# Patient Record
Sex: Female | Born: 1940
Health system: Southern US, Community
[De-identification: ages and names within clinical notes are randomized; demographics above are authoritative.]

## PROBLEM LIST (undated history)

## (undated) DIAGNOSIS — F329 Major depressive disorder, single episode, unspecified: Secondary | ICD-10-CM

## (undated) DIAGNOSIS — C4491 Basal cell carcinoma of skin, unspecified: Secondary | ICD-10-CM

## (undated) DIAGNOSIS — E785 Hyperlipidemia, unspecified: Secondary | ICD-10-CM

## (undated) DIAGNOSIS — G8929 Other chronic pain: Secondary | ICD-10-CM

## (undated) DIAGNOSIS — R51 Headache: Secondary | ICD-10-CM

## (undated) DIAGNOSIS — R519 Headache, unspecified: Secondary | ICD-10-CM

## (undated) DIAGNOSIS — K6281 Anal sphincter tear (healed) (nontraumatic) (old): Secondary | ICD-10-CM

## (undated) DIAGNOSIS — R42 Dizziness and giddiness: Secondary | ICD-10-CM

## (undated) DIAGNOSIS — F32A Depression, unspecified: Secondary | ICD-10-CM

## (undated) DIAGNOSIS — K5790 Diverticulosis of intestine, part unspecified, without perforation or abscess without bleeding: Secondary | ICD-10-CM

## (undated) HISTORY — DX: Anal sphincter tear (healed) (nontraumatic) (old): K62.81

## (undated) HISTORY — DX: Hyperlipidemia, unspecified: E78.5

## (undated) HISTORY — PX: AUGMENTATION MAMMAPLASTY: SUR837

## (undated) HISTORY — PX: COLONOSCOPY: SHX174

## (undated) HISTORY — DX: Headache, unspecified: R51.9

## (undated) HISTORY — DX: Major depressive disorder, single episode, unspecified: F32.9

## (undated) HISTORY — DX: Diverticulosis of intestine, part unspecified, without perforation or abscess without bleeding: K57.90

## (undated) HISTORY — DX: Basal cell carcinoma of skin, unspecified: C44.91

## (undated) HISTORY — PX: TONSILLECTOMY: SUR1361

## (undated) HISTORY — DX: Other chronic pain: G89.29

## (undated) HISTORY — DX: Depression, unspecified: F32.A

## (undated) HISTORY — DX: Headache: R51

## (undated) HISTORY — PX: PARTIAL HYSTERECTOMY: SHX80

---

## 2004-12-31 ENCOUNTER — Ambulatory Visit: Payer: Self-pay | Admitting: Internal Medicine

## 2005-01-10 ENCOUNTER — Ambulatory Visit: Payer: Self-pay | Admitting: Internal Medicine

## 2007-01-05 ENCOUNTER — Ambulatory Visit: Payer: Self-pay

## 2007-11-16 ENCOUNTER — Other Ambulatory Visit: Payer: Self-pay

## 2007-11-16 ENCOUNTER — Ambulatory Visit: Payer: Self-pay

## 2010-02-05 ENCOUNTER — Ambulatory Visit: Payer: Self-pay

## 2010-05-08 ENCOUNTER — Ambulatory Visit: Payer: Self-pay | Admitting: General Practice

## 2014-02-15 ENCOUNTER — Encounter: Payer: Self-pay | Admitting: Internal Medicine

## 2014-04-13 ENCOUNTER — Ambulatory Visit (INDEPENDENT_AMBULATORY_CARE_PROVIDER_SITE_OTHER): Payer: Medicare Other | Admitting: Internal Medicine

## 2014-04-13 ENCOUNTER — Encounter: Payer: Self-pay | Admitting: Internal Medicine

## 2014-04-13 VITALS — BP 140/74 | HR 72 | Ht 62.0 in | Wt 154.1 lb

## 2014-04-13 DIAGNOSIS — K6289 Other specified diseases of anus and rectum: Secondary | ICD-10-CM

## 2014-04-13 DIAGNOSIS — R159 Full incontinence of feces: Secondary | ICD-10-CM

## 2014-04-13 DIAGNOSIS — K629 Disease of anus and rectum, unspecified: Secondary | ICD-10-CM | POA: Insufficient documentation

## 2014-04-13 DIAGNOSIS — K6281 Anal sphincter tear (healed) (nontraumatic) (old): Secondary | ICD-10-CM

## 2014-04-13 HISTORY — DX: Anal sphincter tear (healed) (nontraumatic) (old): K62.81

## 2014-04-13 NOTE — Assessment & Plan Note (Signed)
read the assessment and plan for fecal incontinence If she fails to respond then consider colorectal surgery referral

## 2014-04-13 NOTE — Assessment & Plan Note (Addendum)
I have explained cause, she has sphincter deficit anteriorly most likely related to birth trauma in the past [place] Fecal Incontinence info Benefiber 1-2 tbsp daily My Chart me back 1 month Colonoscopy not needed until 12/2014 routine

## 2014-04-13 NOTE — Patient Instructions (Signed)
Today we are giving you reading information on Fecal Incontinence, [place] exercises, Benefiber ( work up to 2 tablespoons daily).   MyChart [place] back in a month to let us know how your doing.   I appreciate the opportunity to care for you.

## 2014-04-13 NOTE — Progress Notes (Signed)
Subjective:    Patient ID: Brenda Wiley, female    DOB: 11-Jul-1941, 73 y.o.   MRN: [id number]  HPI The patient is a pleasant elderly woman known from prior routine screening colonoscopy. She has been having several month history of worsening fecal incontinence. It's been an intermittent problem for a number of years. She reports having a breech delivery with her first child and a severe tear. Over the years she's had some intermittent mild leakage of stool but lately has been worse. She is quite frequently try to cleanse her anal area as well. She denies constipation or diarrhea problems. There is no rectal bleeding there is no real change in bowel habits. She denies any urinary incontinence. Allergies  Allergen Reactions  . Asa [Aspirin]   . Ceclor [Cefaclor]   . Codeine Itching    Will take if I have to   No outpatient prescriptions prior to visit.   No facility-administered medications prior to visit.   Past Medical History  Diagnosis Date  . Depression     loss of spouse  . Basal cell carcinoma   . Chronic headaches     resolved  . Diverticulosis   . HLD (hyperlipidemia)    Past Surgical History  Procedure Laterality Date  . Cesarean section      x 2  . Partial hysterectomy    . Tonsillectomy    . Colonoscopy     History   Social History  . Marital Status: Married    Spouse Name: N/A    Number of Children: 3  . Years of Education: N/A   Social History Main Topics  . Smoking status: Former Smoker    Types: Cigarettes    Quit date: 10/21/2012  . Smokeless tobacco: Never Used  . Alcohol Use: No     Comment: occasional-beer  . Drug Use: No  . Sexual Activity: None   Other Topics Concern  . None   Social History Narrative   Widowed after second marriage. 2 sons one daughter. None of them are local.   She is retired Building control surveyor.      She drinks one half cups of coffee daily.   Family History  Problem Relation  Age of Onset  . Diabetes Other     maternal side  . Heart disease Father   . Heart disease Mother   . Heart disease Brother   . Cancer Maternal Aunt     type unknown  . Diverticulitis Other     nephew x 2     Review of Systems Some insomnia and depressive symptoms related to loss of her spouse, back pain some cough. All other review of systems negative or as per history of present illness.    Objective:   Physical Exam General:  Well-developed, well-nourished and in no acute distress Eyes:  anicteric. Neck:   supple w/o thyromegaly or mass.  Lungs: Clear to auscultation bilaterally. Heart:  S1S2, no rubs, murmurs, gallops. Abdomen:  soft, non-tender, no hepatosplenomegaly, hernia, or mass and BS+. Low-transverse scar Rectal:   Anoderm inspection revealed anterior scar Anal wink was absent Digital exam revealed mild-moderate decreased  resting tone and voluntary squeeze - anterior No mass or rectocele present. Simulated defecation with valsalva revealed appropriate abdominal contraction and descent.   Anoscopy was performed with the patient in the left lateral decubitus position while a chaperone was present and revealed anterior sphincter defect, no hemorrhoids, normal mucosa   Lymph:  no cervical or supraclavicular adenopathy. Extremities:   no edema Skin   no rash. Neuro:  A&O x 3.  Psych:  appropriate mood and  Affect.   Data Reviewed:  2006 colonoscopy showing small hemorrhoids and sigmoid diverticulosis      Assessment & Plan:  Fecal incontinence due to anorectal disorder I have explained cause, she has sphincter deficit anteriorly most likely related to birth trauma in the past [place] Fecal Incontinence info Benefiber 1-2 tbsp daily My Chart me back 1 month Colonoscopy not needed until 12/2014 routine  Anal sphincter tear (healed) (old)-anterior  read the assessment and plan for fecal incontinence If she fails to respond then consider colorectal  surgery referral   I appreciate the opportunity to care for this patient. CC: Ronald Lobo, MD

## 2015-01-09 ENCOUNTER — Encounter: Payer: Self-pay | Admitting: Internal Medicine

## 2015-01-19 ENCOUNTER — Ambulatory Visit: Admit: 2015-01-19 | Disposition: A | Discharge status: Home / Self Care | Payer: Self-pay

## 2015-02-17 ENCOUNTER — Ambulatory Visit (INDEPENDENT_AMBULATORY_CARE_PROVIDER_SITE_OTHER): Payer: Medicare Other | Admitting: Cardiovascular Disease

## 2015-02-17 ENCOUNTER — Encounter: Payer: Self-pay | Admitting: Cardiovascular Disease

## 2015-02-17 VITALS — BP 150/64 | HR 61 | Ht 62.0 in | Wt 143.5 lb

## 2015-02-17 DIAGNOSIS — Z720 Tobacco use: Secondary | ICD-10-CM | POA: Diagnosis not present

## 2015-02-17 DIAGNOSIS — F17200 Nicotine dependence, unspecified, uncomplicated: Secondary | ICD-10-CM

## 2015-02-17 DIAGNOSIS — R262 Difficulty in walking, not elsewhere classified: Secondary | ICD-10-CM

## 2015-02-17 DIAGNOSIS — R0602 Shortness of breath: Secondary | ICD-10-CM

## 2015-02-17 DIAGNOSIS — E785 Hyperlipidemia, unspecified: Secondary | ICD-10-CM

## 2015-02-17 DIAGNOSIS — M160 Bilateral primary osteoarthritis of hip: Secondary | ICD-10-CM | POA: Diagnosis not present

## 2015-02-17 NOTE — Assessment & Plan Note (Signed)
Etiology of her shortness of breath is concerning for ischemia. Strong family history of coronary artery disease, father died in his 61s. She is a smoker, hyperlipidemia. Worsening shortness of breath symptoms over the past year. She is unable to treadmill secondary to severe hip pain. We will order a pharmacologic Myoview to rule out ischemia. Recent PFTs suggesting no significant  COPD

## 2015-02-17 NOTE — Assessment & Plan Note (Signed)
She does not want a statin at this time. We have recommended dietary restrictions, staying active

## 2015-02-17 NOTE — Assessment & Plan Note (Signed)
We have encouraged her to continue to work on weaning her cigarettes and smoking cessation. She will continue to work on this and does not want any assistance with chantix.  

## 2015-02-17 NOTE — Progress Notes (Signed)
Patient ID: Brenda Wiley, female    DOB: 09/26/1941, 74 y.o.   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  She reports that for the past several months probably up to one year she has had shortness of breath. Symptoms worse with exertion. She has had several episodes of severe shortness of breath with exertion that have caught her attention. These episodes she had to stop, catch her breath before continuing her activity.  She was recently sent for PFTs at [facility] The showed no significant COPD or restrictive lung disease  She continues to smoke several cigarettes, possibly one pack per week or less. She denies any significant chest pain. She is relatively active but limited by her hip pain. She thinks that she needs a cortisone shot for her symptoms. Unable to sleep on her right side secondary to shoulder pain. She was concerned some of her medications could be causing her shortness of breath and she stopped her Flexeril.  She takes phentermine for weight loss. EKG on today's visit shows normal sinus rhythm with rate 61 bpm, no significant ST or T-wave changes   Allergies  Allergen Reactions  . Asa [Aspirin]   . Ceclor [Cefaclor]   . Codeine Itching    Will take if I have to    Outpatient Encounter Prescriptions as of 02/17/2015  Medication Sig  . FLUoxetine (PROZAC) 10 MG capsule Take 10 mg by mouth daily.   . Oxycodone HCl 10 MG TABS Take 10 mg by mouth 2 (two) times daily as needed. for pain  . Phentermine HCl 37.5 MG TBDP Take 18.75 mg by mouth daily.  . [DISCONTINUED] cyclobenzaprine (FLEXERIL) 5 MG tablet Take 2.5 mg by mouth as needed for muscle spasms.  . [DISCONTINUED] zolpidem (AMBIEN) 10 MG tablet Take 5 mg by mouth at bedtime as needed.     Past Medical  History  Diagnosis Date  . Depression     loss of spouse  . Basal cell carcinoma   . Chronic headaches     resolved  . Diverticulosis   . HLD (hyperlipidemia)   . Anal sphincter tear (healed) (old)-anterior 04/13/2014    Past Surgical History  Procedure Laterality Date  . Cesarean section      x 2  . Partial hysterectomy    . Tonsillectomy    . Colonoscopy      Social History  reports that she quit smoking about 2 years ago. Her smoking use included Cigarettes. She has never used smokeless tobacco. She reports that she does not drink alcohol or use illicit drugs.  Family History family history includes Cancer in her maternal aunt; Diabetes in her other; Diverticulitis in her other; Heart disease in her brother, father, and mother. Father died at 84 from MI, mother died at 38 also had severe CAD, both were smokers     Review of Systems  Constitutional: Negative.   Respiratory: Positive for shortness of breath.   Gastrointestinal: Negative.   Musculoskeletal: Positive for arthralgias.  Skin: Negative.   Neurological: Negative.   Hematological: Negative.   Psychiatric/Behavioral: Negative.   All other systems reviewed and are negative.   BP 150/64 mmHg  Pulse 61  Ht 5\' 2"  (1.575 m)  Wt 143 lb 8 oz (65.091 kg)  BMI 26.24 kg/m2  Physical Exam  Constitutional:  She is oriented to person, place, and time. She appears well-developed and well-nourished.  HENT:  Head: Normocephalic.  Nose: Nose normal.  Mouth/Throat: Oropharynx is clear and moist.  Eyes: Conjunctivae are normal. Pupils are equal, round, and reactive to light.  Neck: Normal range of motion. Neck supple. No JVD present.  Cardiovascular: Normal rate, regular rhythm, S1 normal, S2 normal, normal heart sounds and intact distal pulses.  Exam reveals no gallop and no friction rub.   No murmur heard. Pulmonary/Chest: Effort normal and breath sounds normal. No respiratory distress. She has no wheezes. She has no  rales. She exhibits no tenderness.  Abdominal: Soft. Bowel sounds are normal. She exhibits no distension. There is no tenderness.  Musculoskeletal: Normal range of motion. She exhibits no edema or tenderness.  Lymphadenopathy:    She has no cervical adenopathy.  Neurological: She is alert and oriented to person, place, and time. Coordination normal.  Skin: Skin is warm and dry. No rash noted. No erythema.  Psychiatric: She has a normal mood and affect. Her behavior is normal. Judgment and thought content normal.    Assessment and Plan  Nursing note and vitals reviewed.

## 2015-02-17 NOTE — Patient Instructions (Addendum)
You are doing well. No medication changes were made.  We will schedule a lexiscan stress test for shortness of breath  Please call us if you have new issues that need to be addressed before your next appt.    [place]  Your caregiver has ordered a Stress Test with nuclear imaging. The purpose of this test is to evaluate the blood supply to your heart muscle. This procedure is referred to as a "Non-Invasive Stress Test." This is because other than having an IV started in your vein, nothing is inserted or "invades" your body. Cardiac stress tests are done to find areas of poor blood flow to the heart by determining the extent of coronary artery disease (CAD). Some patients exercise on a treadmill, which naturally increases the blood flow to your heart, while others who are  unable to walk on a treadmill due to physical limitations have a pharmacologic/chemical stress agent called Lexiscan . This medicine will mimic walking on a treadmill by temporarily increasing your coronary blood flow.   Please note: these test may take anywhere between 2-4 hours to complete  PLEASE REPORT TO [facility] AT THE FIRST DESK WILL DIRECT YOU WHERE TO GO  Date of Procedure:_____Tuesday, May 3________  Arrival Time for Procedure:______7:15 am________   PLEASE NOTIFY THE OFFICE AT LEAST 24 HOURS IN ADVANCE IF YOU ARE UNABLE TO KEEP YOUR APPOINTMENT.  [phone] AND  PLEASE NOTIFY NUCLEAR MEDICINE AT [facility] AT LEAST 24 HOURS IN ADVANCE IF YOU ARE UNABLE TO KEEP YOUR APPOINTMENT. [phone]  How to prepare for your Myoview test:  1. Do not eat or drink after midnight 2. No caffeine for 24 hours prior to test 3. No smoking 24 hours prior to test. 4. Your medication may be taken with water.  If your doctor stopped a medication because of this test, do not take that medication. 5. Ladies, please do not wear dresses.  Skirts or pants are appropriate. Please wear a short sleeve  shirt. 6. No perfume, cologne or lotion.

## 2015-02-17 NOTE — Assessment & Plan Note (Signed)
Significant hip pain. She feels she needs a cortisone shot. We'll avoid treadmill  stress test  given her symptoms

## 2015-02-21 ENCOUNTER — Ambulatory Visit: Payer: Medicare Other

## 2015-02-23 ENCOUNTER — Ambulatory Visit: Payer: Medicare Other

## 2015-02-23 ENCOUNTER — Encounter: Payer: Self-pay | Admitting: Physician Assistant

## 2015-02-23 ENCOUNTER — Other Ambulatory Visit: Payer: Self-pay | Admitting: Cardiovascular Disease

## 2015-02-23 ENCOUNTER — Encounter: Admission: RE | Admit: 2015-02-23 | Payer: Medicare Other | Source: Ambulatory Visit

## 2015-02-23 ENCOUNTER — Ambulatory Visit
Admission: RE | Admit: 2015-02-23 | Discharge: 2015-02-23 | Disposition: A | Discharge status: Home / Self Care | Payer: Medicare Other | Source: Ambulatory Visit | Attending: Cardiovascular Disease | Admitting: Cardiovascular Disease

## 2015-02-23 ENCOUNTER — Encounter
Admission: RE | Admit: 2015-02-23 | Discharge: 2015-02-23 | Disposition: A | Discharge status: Home / Self Care | Payer: Medicare Other | Source: Ambulatory Visit | Attending: Cardiovascular Disease | Admitting: Cardiovascular Disease

## 2015-02-23 DIAGNOSIS — M160 Bilateral primary osteoarthritis of hip: Secondary | ICD-10-CM | POA: Diagnosis not present

## 2015-02-23 DIAGNOSIS — R0602 Shortness of breath: Secondary | ICD-10-CM

## 2015-02-23 DIAGNOSIS — E785 Hyperlipidemia, unspecified: Secondary | ICD-10-CM

## 2015-02-23 DIAGNOSIS — R42 Dizziness and giddiness: Secondary | ICD-10-CM

## 2015-02-23 DIAGNOSIS — F17200 Nicotine dependence, unspecified, uncomplicated: Secondary | ICD-10-CM | POA: Insufficient documentation

## 2015-02-23 DIAGNOSIS — R262 Difficulty in walking, not elsewhere classified: Secondary | ICD-10-CM | POA: Diagnosis not present

## 2015-02-23 HISTORY — DX: Dizziness and giddiness: R42

## 2015-02-23 LAB — NM MYOCAR MULTI W/SPECT W/WALL MOTION / EF
CHL CUP NUCLEAR SDS: 0
CHL CUP STRESS STAGE 1 HR: 50 {beats}/min
CHL CUP STRESS STAGE 2 HR: 54 {beats}/min
CHL CUP STRESS STAGE 5 DBP: 48 mmHg
CHL CUP STRESS STAGE 5 GRADE: 0 %
CHL CUP STRESS STAGE 5 SPEED: 0 mph
CSEPEW: 1 METS
CSEPPHR: 67 {beats}/min
LV dias vol: 28 mL
LV sys vol: 61 mL
Percent of predicted max HR: 45 %
Rest HR: 51 {beats}/min
SRS: 0
SSS: 1
Stage 2 Grade: 0 %
Stage 2 Speed: 0 mph
Stage 3 Grade: 0 %
Stage 3 HR: 54 {beats}/min
Stage 3 Speed: 0 mph
Stage 4 Grade: 0 %
Stage 4 HR: 67 {beats}/min
Stage 4 Speed: 0 mph
Stage 5 HR: 71 {beats}/min
Stage 5 SBP: 145 mmHg
TID: 1.19

## 2015-02-23 MED ORDER — REGADENOSON 0.4 MG/5ML IV SOLN
0.4000 mg | Freq: Once | INTRAVENOUS | Status: AC
  Administered 2015-02-23: 0.4 mg via INTRAVENOUS

## 2015-02-23 MED ORDER — TECHNETIUM TC 99M SESTAMIBI - CARDIOLITE
14.2330 | Freq: Once | INTRAVENOUS | Status: AC | PRN
  Administered 2015-02-23: 14.233 via INTRAVENOUS

## 2015-02-23 MED ORDER — TECHNETIUM TC 99M SESTAMIBI - CARDIOLITE
30.0000 | Freq: Once | INTRAVENOUS | Status: AC | PRN
  Administered 2015-02-23: 09:00:00 28.94 via INTRAVENOUS

## 2015-02-23 NOTE — CV Procedure (Signed)
    Ordering MD: Dr. Rockey Situ, MD  Test type: Lexiscan Myoview  EKG: sinus bradycardia, 51 bpm, no st/t changes  Pulse: 51  BP: 147/56  Max heart rate: 73  Max BP: 147/56  %HR: 50%  Symptoms: mild chest tightness, resolved by test end  EKG changes: TWI II, III, aVF, V3-V6  Myoview Images to follow.    Christell Faith, PA-C 02/23/2015 8:44 AM

## 2015-02-24 ENCOUNTER — Ambulatory Visit: Payer: Medicare Other

## 2015-05-30 ENCOUNTER — Encounter: Payer: Self-pay | Admitting: Internal Medicine

## 2016-11-12 DIAGNOSIS — F3341 Major depressive disorder, recurrent, in partial remission: Secondary | ICD-10-CM | POA: Diagnosis not present

## 2016-11-29 DIAGNOSIS — F3341 Major depressive disorder, recurrent, in partial remission: Secondary | ICD-10-CM | POA: Diagnosis not present

## 2016-11-29 DIAGNOSIS — J431 Panlobular emphysema: Secondary | ICD-10-CM | POA: Diagnosis not present

## 2016-11-29 DIAGNOSIS — G894 Chronic pain syndrome: Secondary | ICD-10-CM | POA: Diagnosis not present

## 2016-12-24 DIAGNOSIS — F3341 Major depressive disorder, recurrent, in partial remission: Secondary | ICD-10-CM | POA: Diagnosis not present

## 2016-12-24 DIAGNOSIS — J431 Panlobular emphysema: Secondary | ICD-10-CM | POA: Diagnosis not present

## 2016-12-24 DIAGNOSIS — G894 Chronic pain syndrome: Secondary | ICD-10-CM | POA: Diagnosis not present

## 2016-12-24 DIAGNOSIS — R7309 Other abnormal glucose: Secondary | ICD-10-CM | POA: Diagnosis not present

## 2016-12-24 DIAGNOSIS — Z79899 Other long term (current) drug therapy: Secondary | ICD-10-CM | POA: Diagnosis not present

## 2016-12-24 DIAGNOSIS — E7800 Pure hypercholesterolemia, unspecified: Secondary | ICD-10-CM | POA: Diagnosis not present

## 2016-12-24 DIAGNOSIS — Z0000 Encounter for general adult medical examination without abnormal findings: Secondary | ICD-10-CM | POA: Diagnosis not present

## 2017-01-28 DIAGNOSIS — G894 Chronic pain syndrome: Secondary | ICD-10-CM | POA: Diagnosis not present

## 2017-01-28 DIAGNOSIS — M5137 Other intervertebral disc degeneration, lumbosacral region: Secondary | ICD-10-CM | POA: Diagnosis not present

## 2017-03-26 DIAGNOSIS — J449 Chronic obstructive pulmonary disease, unspecified: Secondary | ICD-10-CM | POA: Diagnosis not present

## 2017-03-26 DIAGNOSIS — R7309 Other abnormal glucose: Secondary | ICD-10-CM | POA: Diagnosis not present

## 2017-03-26 DIAGNOSIS — G894 Chronic pain syndrome: Secondary | ICD-10-CM | POA: Diagnosis not present

## 2017-03-26 DIAGNOSIS — E7800 Pure hypercholesterolemia, unspecified: Secondary | ICD-10-CM | POA: Diagnosis not present

## 2017-03-26 DIAGNOSIS — Z79899 Other long term (current) drug therapy: Secondary | ICD-10-CM | POA: Diagnosis not present

## 2017-03-26 DIAGNOSIS — J431 Panlobular emphysema: Secondary | ICD-10-CM | POA: Diagnosis not present

## 2017-04-10 DIAGNOSIS — E871 Hypo-osmolality and hyponatremia: Secondary | ICD-10-CM | POA: Diagnosis not present

## 2017-05-02 ENCOUNTER — Other Ambulatory Visit: Payer: Self-pay | Admitting: Internal Medicine

## 2017-05-02 DIAGNOSIS — F3341 Major depressive disorder, recurrent, in partial remission: Secondary | ICD-10-CM | POA: Diagnosis not present

## 2017-05-02 DIAGNOSIS — N183 Chronic kidney disease, stage 3 unspecified: Secondary | ICD-10-CM

## 2017-05-02 DIAGNOSIS — G894 Chronic pain syndrome: Secondary | ICD-10-CM | POA: Diagnosis not present

## 2017-05-02 DIAGNOSIS — E7800 Pure hypercholesterolemia, unspecified: Secondary | ICD-10-CM | POA: Diagnosis not present

## 2017-05-08 ENCOUNTER — Ambulatory Visit
Admission: RE | Admit: 2017-05-08 | Discharge: 2017-05-08 | Disposition: A | Discharge status: Home / Self Care | Payer: PPO | Source: Ambulatory Visit | Attending: Internal Medicine | Admitting: Internal Medicine

## 2017-05-08 DIAGNOSIS — N183 Chronic kidney disease, stage 3 unspecified: Secondary | ICD-10-CM

## 2017-06-18 DIAGNOSIS — G894 Chronic pain syndrome: Secondary | ICD-10-CM | POA: Diagnosis not present

## 2017-06-18 DIAGNOSIS — N289 Disorder of kidney and ureter, unspecified: Secondary | ICD-10-CM | POA: Diagnosis not present

## 2017-06-18 DIAGNOSIS — F3341 Major depressive disorder, recurrent, in partial remission: Secondary | ICD-10-CM | POA: Diagnosis not present

## 2017-06-18 DIAGNOSIS — J431 Panlobular emphysema: Secondary | ICD-10-CM | POA: Diagnosis not present

## 2017-06-18 DIAGNOSIS — M5137 Other intervertebral disc degeneration, lumbosacral region: Secondary | ICD-10-CM | POA: Diagnosis not present

## 2017-06-18 DIAGNOSIS — E7800 Pure hypercholesterolemia, unspecified: Secondary | ICD-10-CM | POA: Diagnosis not present

## 2017-06-18 DIAGNOSIS — R7309 Other abnormal glucose: Secondary | ICD-10-CM | POA: Diagnosis not present

## 2017-08-28 DIAGNOSIS — G894 Chronic pain syndrome: Secondary | ICD-10-CM | POA: Diagnosis not present

## 2017-08-28 DIAGNOSIS — Z79899 Other long term (current) drug therapy: Secondary | ICD-10-CM | POA: Diagnosis not present

## 2017-08-28 DIAGNOSIS — R7309 Other abnormal glucose: Secondary | ICD-10-CM | POA: Diagnosis not present

## 2017-08-28 DIAGNOSIS — E7800 Pure hypercholesterolemia, unspecified: Secondary | ICD-10-CM | POA: Diagnosis not present

## 2017-08-28 DIAGNOSIS — F3341 Major depressive disorder, recurrent, in partial remission: Secondary | ICD-10-CM | POA: Diagnosis not present

## 2017-08-28 DIAGNOSIS — J431 Panlobular emphysema: Secondary | ICD-10-CM | POA: Diagnosis not present

## 2017-12-03 DIAGNOSIS — J449 Chronic obstructive pulmonary disease, unspecified: Secondary | ICD-10-CM | POA: Diagnosis not present

## 2017-12-03 DIAGNOSIS — G894 Chronic pain syndrome: Secondary | ICD-10-CM | POA: Diagnosis not present

## 2017-12-03 DIAGNOSIS — E7800 Pure hypercholesterolemia, unspecified: Secondary | ICD-10-CM | POA: Diagnosis not present

## 2017-12-03 DIAGNOSIS — Z0000 Encounter for general adult medical examination without abnormal findings: Secondary | ICD-10-CM | POA: Diagnosis not present

## 2017-12-03 DIAGNOSIS — Z79899 Other long term (current) drug therapy: Secondary | ICD-10-CM | POA: Diagnosis not present

## 2017-12-03 DIAGNOSIS — J431 Panlobular emphysema: Secondary | ICD-10-CM | POA: Diagnosis not present

## 2017-12-03 DIAGNOSIS — R7309 Other abnormal glucose: Secondary | ICD-10-CM | POA: Diagnosis not present

## 2018-04-13 DIAGNOSIS — Z79899 Other long term (current) drug therapy: Secondary | ICD-10-CM | POA: Diagnosis not present

## 2018-04-13 DIAGNOSIS — E7800 Pure hypercholesterolemia, unspecified: Secondary | ICD-10-CM | POA: Diagnosis not present

## 2018-04-13 DIAGNOSIS — R7309 Other abnormal glucose: Secondary | ICD-10-CM | POA: Diagnosis not present

## 2018-04-21 DIAGNOSIS — I10 Essential (primary) hypertension: Secondary | ICD-10-CM | POA: Diagnosis not present

## 2018-05-12 ENCOUNTER — Other Ambulatory Visit: Payer: Self-pay | Admitting: Internal Medicine

## 2018-05-12 DIAGNOSIS — Z1211 Encounter for screening for malignant neoplasm of colon: Secondary | ICD-10-CM | POA: Diagnosis not present

## 2018-05-12 DIAGNOSIS — Z79899 Other long term (current) drug therapy: Secondary | ICD-10-CM | POA: Diagnosis not present

## 2018-05-12 DIAGNOSIS — J439 Emphysema, unspecified: Secondary | ICD-10-CM | POA: Diagnosis not present

## 2018-05-12 DIAGNOSIS — G894 Chronic pain syndrome: Secondary | ICD-10-CM | POA: Diagnosis not present

## 2018-05-12 DIAGNOSIS — Z0000 Encounter for general adult medical examination without abnormal findings: Secondary | ICD-10-CM | POA: Diagnosis not present

## 2018-05-12 DIAGNOSIS — J431 Panlobular emphysema: Secondary | ICD-10-CM | POA: Diagnosis not present

## 2018-05-12 DIAGNOSIS — F3341 Major depressive disorder, recurrent, in partial remission: Secondary | ICD-10-CM | POA: Diagnosis not present

## 2018-05-12 DIAGNOSIS — R1011 Right upper quadrant pain: Secondary | ICD-10-CM | POA: Diagnosis not present

## 2018-05-12 DIAGNOSIS — R7309 Other abnormal glucose: Secondary | ICD-10-CM | POA: Diagnosis not present

## 2018-05-12 DIAGNOSIS — E7800 Pure hypercholesterolemia, unspecified: Secondary | ICD-10-CM | POA: Diagnosis not present

## 2018-05-18 ENCOUNTER — Ambulatory Visit
Admission: RE | Admit: 2018-05-18 | Discharge: 2018-05-18 | Disposition: A | Discharge status: Home / Self Care | Payer: PPO | Source: Ambulatory Visit | Attending: Internal Medicine | Admitting: Internal Medicine

## 2018-05-18 DIAGNOSIS — I700 Atherosclerosis of aorta: Secondary | ICD-10-CM | POA: Diagnosis not present

## 2018-05-18 DIAGNOSIS — R1011 Right upper quadrant pain: Secondary | ICD-10-CM

## 2018-05-18 DIAGNOSIS — Z1211 Encounter for screening for malignant neoplasm of colon: Secondary | ICD-10-CM | POA: Diagnosis not present

## 2018-05-28 DIAGNOSIS — R195 Other fecal abnormalities: Secondary | ICD-10-CM | POA: Diagnosis not present

## 2018-07-07 DIAGNOSIS — H43811 Vitreous degeneration, right eye: Secondary | ICD-10-CM | POA: Diagnosis not present

## 2018-07-07 DIAGNOSIS — H2513 Age-related nuclear cataract, bilateral: Secondary | ICD-10-CM | POA: Diagnosis not present

## 2018-08-20 DIAGNOSIS — R7309 Other abnormal glucose: Secondary | ICD-10-CM | POA: Diagnosis not present

## 2018-08-20 DIAGNOSIS — J431 Panlobular emphysema: Secondary | ICD-10-CM | POA: Diagnosis not present

## 2018-08-20 DIAGNOSIS — R42 Dizziness and giddiness: Secondary | ICD-10-CM | POA: Diagnosis not present

## 2018-08-20 DIAGNOSIS — E7800 Pure hypercholesterolemia, unspecified: Secondary | ICD-10-CM | POA: Diagnosis not present

## 2018-08-20 DIAGNOSIS — Z79899 Other long term (current) drug therapy: Secondary | ICD-10-CM | POA: Diagnosis not present

## 2018-08-20 DIAGNOSIS — R011 Cardiac murmur, unspecified: Secondary | ICD-10-CM | POA: Diagnosis not present

## 2018-08-28 DIAGNOSIS — R011 Cardiac murmur, unspecified: Secondary | ICD-10-CM | POA: Diagnosis not present

## 2018-08-28 DIAGNOSIS — I6523 Occlusion and stenosis of bilateral carotid arteries: Secondary | ICD-10-CM | POA: Diagnosis not present

## 2018-08-28 DIAGNOSIS — R42 Dizziness and giddiness: Secondary | ICD-10-CM | POA: Diagnosis not present

## 2018-08-31 DIAGNOSIS — H2512 Age-related nuclear cataract, left eye: Secondary | ICD-10-CM | POA: Diagnosis not present

## 2018-08-31 DIAGNOSIS — H25012 Cortical age-related cataract, left eye: Secondary | ICD-10-CM | POA: Diagnosis not present

## 2018-08-31 DIAGNOSIS — H35342 Macular cyst, hole, or pseudohole, left eye: Secondary | ICD-10-CM | POA: Diagnosis not present

## 2018-08-31 DIAGNOSIS — H25013 Cortical age-related cataract, bilateral: Secondary | ICD-10-CM | POA: Diagnosis not present

## 2018-08-31 DIAGNOSIS — H2513 Age-related nuclear cataract, bilateral: Secondary | ICD-10-CM | POA: Diagnosis not present

## 2018-08-31 DIAGNOSIS — H35033 Hypertensive retinopathy, bilateral: Secondary | ICD-10-CM | POA: Diagnosis not present

## 2018-09-08 NOTE — Progress Notes (Signed)
[facility] Note  09/09/2018     CHIEF COMPLAINT Patient presents for Retina Evaluation   HISTORY OF PRESENT ILLNESS: Brenda Wiley is a 77 y.o. female who presents to the clinic today for:   HPI    Retina Evaluation    In left eye.  Duration of 9 days.  Associated Symptoms Floaters.  Negative for Flashes, Blind Spot, Photophobia, Scalp Tenderness, Fever, Weight Loss, Jaw Claudication, Glare, Pain, Distortion, Redness, Trauma, Shoulder/Hip pain and Fatigue.  Context:  distance vision, mid-range vision and near vision.  Treatments tried include no treatments.  Response to treatment was no improvement.  I, the attending physician,  performed the HPI with the patient and updated documentation appropriately.          Comments    Pt presents on the referral of Dr. Monna Fam for possible macular cyst/cataract clearance OS, pt states she does have floaters in her left eye only, but she denies FOL and blurry vision, pt denies being diabetic       Last edited by Bernarda Caffey, MD on 09/09/2018 10:54 AM. (History)    pt states Dr. Herbert Deaner is planning on doing cataract sx on November 26th and she is here to get clearance for surgery, pt states she is not having any problems with her eyes, pt denies being diabetic or having high blood pressure, pt states she takes medication for cholesterol and anxiety, pt states she sees a floater in her left eye that she estimates has been there for 8-10 years, pt states she remembers the first time she saw it, she states she had pain in her eye and it caused her not to be able to see for a few minutes, pt states she saw Dr. Ellin Mayhew the day it happened  Referring physician: Monna Fam, [place],  [id number]  HISTORICAL INFORMATION:   Selected notes from the MEDICAL RECORD NUMBER Referred by Dr. Monna Fam for concern of macular cyst / surgical clearance LEE: 11.11.19 (K. Hecker) [BCVA:  OD: 20/25 OS: 20/30-] Ocular Hx-HTN ret, drusen, PVD, chemosis, optic nerve atrophy, arteriolar sclerosis PMH-anxiety, current smoker    CURRENT MEDICATIONS: Current Outpatient Medications (Ophthalmic Drugs)  Medication Sig  . ketorolac (ACULAR) 0.5 % ophthalmic solution INSTILL 1 DROP IN LEFT EYE FOUR TIMES A DAY START 1 WEEK PRIOR TO SURGERY  . ofloxacin (OCUFLOX) 0.3 % ophthalmic solution INSTILL 1 DROP IN BOTH EYES FOUR TIMES A DAY START 72 HOURS PRIOR TO SURGERY  . prednisoLONE acetate (PRED FORTE) 1 % ophthalmic suspension INSTILL 1 DROP IN LEFT EYE FOUR TIMES A DAY START AFTER SURGERY   No current facility-administered medications for this visit.  (Ophthalmic Drugs)   Current Outpatient Medications (Other)  Medication Sig  . ALPRAZolam (XANAX) 0.25 MG tablet Take by mouth.  . ALPRAZolam (XANAX) 0.25 MG tablet TAKE 1 TABLET BY MOUTH NIGHTLY AS NEEDED FOR SLEEP  . FLUoxetine (PROZAC) 10 MG capsule Take 10 mg by mouth daily.   . Oxycodone HCl 10 MG TABS Take 10 mg by mouth 2 (two) times daily as needed. for pain  . Phentermine HCl 37.5 MG TBDP Take 18.75 mg by mouth daily.   No current facility-administered medications for this visit.  (Other)      REVIEW OF SYSTEMS: ROS    Positive for: Constitutional, Eyes, Psychiatric   Negative for: Gastrointestinal, Neurological, Skin, Genitourinary, Musculoskeletal, HENT, Endocrine, Cardiovascular, Respiratory, Allergic/Imm, Heme/Lymph   Last edited by Ernest Mallick  J, COT on 09/09/2018  8:33 AM. (History)       ALLERGIES Allergies  Allergen Reactions  . Asa [Aspirin]   . Ceclor [Cefaclor]   . Codeine Itching    Will take if I have to    PAST MEDICAL HISTORY Past Medical History:  Diagnosis Date  . Anal sphincter tear (healed) (old)-anterior 04/13/2014  . Basal cell carcinoma   . Chronic headaches    resolved  . Depression    loss of spouse  . Diverticulosis   . Dizzy spells 02/23/2015   X 1year  . HLD (hyperlipidemia)     Past Surgical History:  Procedure Laterality Date  . CESAREAN SECTION     x 2  . COLONOSCOPY    . PARTIAL HYSTERECTOMY    . TONSILLECTOMY      FAMILY HISTORY Family History  Problem Relation Age of Onset  . Heart disease Father   . Heart disease Mother   . Cataracts Mother   . Diabetes Other        maternal side  . Heart disease Brother   . Cancer Maternal Aunt        type unknown  . Diverticulitis Other        nephew x 2  . Cataracts Sister   . Amblyopia Neg Hx   . Blindness Neg Hx   . Glaucoma Neg Hx   . Macular degeneration Neg Hx   . Retinal detachment Neg Hx   . Strabismus Neg Hx   . Retinitis pigmentosa Neg Hx     SOCIAL HISTORY Social History   Tobacco Use  . Smoking status: Former Smoker    Types: Cigarettes    Last attempt to quit: 10/21/2012    Years since quitting: 5.8  . Smokeless tobacco: Never Used  Substance Use Topics  . Alcohol use: No    Comment: occasional-beer  . Drug use: No         OPHTHALMIC EXAM:  Base Eye Exam    Visual Acuity (Snellen - Linear)      Right Left   Dist cc 20/70 +2 20/60   Dist ph cc 20/50 NI   Correction:  Glasses       Tonometry (Tonopen, 8:53 AM)      Right Left   Pressure 18 18       Pupils      Dark Light Shape React APD   Right 5 2 Round Brisk None   Left 5 2 Round Brisk None       Visual Fields (Counting fingers)      Left Right    Full Full       Extraocular Movement      Right Left    Full, Ortho Full, Ortho       Neuro/Psych    Oriented x3:  Yes   Mood/Affect:  Normal       Dilation    Both eyes:  1.0% Mydriacyl, 2.5% Phenylephrine @ 8:53 AM        Slit Lamp and Fundus Exam    Slit Lamp Exam      Right Left   Lids/Lashes Dermatochalasis - upper lid, mild Meibomian gland dysfunction, mild Telangiectasia Dermatochalasis - upper lid, mild Meibomian gland dysfunction, mild Telangiectasia   Conjunctiva/Sclera White and quiet White and quiet   Cornea Arcus, 1+ Punctate  epithelial erosions Arcus, 1+ Punctate epithelial erosions   Anterior Chamber Deep and clear Deep and clear   Iris Round and  dilated Round and dilated   Lens 2-3+ Nuclear sclerosis, 2+ Cortical cataract 3+ Nuclear sclerosis, 2-3+ Cortical cataract   Vitreous Vitreous syneresis Vitreous syneresis, Posterior vitreous detachment       Fundus Exam      Right Left   Disc Pink and Sharp Pink and Sharp   C/D Ratio 0.3 0.5   Macula Flat, Blunted foveal reflex, Retinal pigment epithelial mottling, no edema Flat, Blunted foveal reflex, Epiretinal membrane, Retinal pigment epithelial mottling, No heme or edema   Vessels mild Vascular attenuation, Copper wiring, AV crossing changes mild Vascular attenuation, Copper wiring, AV crossing changes   Periphery Attached, single blot heme at 0800 equator, 2 pigmented CR scars at 0300 midzone, hypopigmented VR tufts at 1030  Attached, dot hemes temporally, horseshoe tear at 0230 with pigment surrounding, no SRF        Refraction    Wearing Rx      Sphere Cylinder Axis Add   Right +1.75 +3.50 165 +2.00   Left +2.00 +2.00 012 +2.00       Manifest Refraction      Sphere Cylinder Axis Dist VA   Right +0.50 +4.00 165 20/60-   Left +2.00 +2.00 165 20/60          IMAGING AND PROCEDURES  Imaging and Procedures for '@TODAY'$ @  OCT, Retina - OU - Both Eyes       Right Eye Quality was good. Central Foveal Thickness: 252. Progression has no prior data. Findings include normal foveal contour, no SRF, no IRF, retinal drusen .   Left Eye Quality was good. Central Foveal Thickness: 332. Progression has no prior data. Findings include abnormal foveal contour, no SRF, intraretinal fluid, epiretinal membrane, retinal drusen .   Notes *Images captured and stored on drive  Diagnosis / Impression:  Non-exu ARMD OU OS: retinal edema and mild pucker   Clinical management:  See below  Abbreviations: NFP - Normal foveal profile. CME - cystoid macular edema.  PED - pigment epithelial detachment. IRF - intraretinal fluid. SRF - subretinal fluid. EZ - ellipsoid zone. ERM - epiretinal membrane. ORA - outer retinal atrophy. ORT - outer retinal tubulation. SRHM - subretinal hyper-reflective material        Repair Retinal Breaks, Laser - OS - Left Eye       LASER PROCEDURE NOTE  Procedure:  Barrier laser retinopexy using laser indirect ophthalmoscope, LEFT eye   Diagnosis:   Retinal tear, LEFT eye                     Flap tear at 230 o'clock anterior to equator   Surgeon: Bernarda Caffey, MD, PhD  Anesthesia: Topical  Informed consent obtained, operative eye marked, and time out performed prior to initiation of laser.   Laser settings:  Lumenis [id number] laser indirect ophthalmoscope Power: 400 mW Duration: 100 msec  # spots: 403  Placement of laser: Laser was placed in three confluent rows around flap tear at 230 oclock anterior to equator with additional rows anteriorly.  Complications: None.  Patient tolerated the procedure well and received written and verbal post-procedure care information/education.                 ASSESSMENT/PLAN:    ICD-10-CM   1. Retinal tear of left eye H33.312 Repair Retinal Breaks, Laser - OS - Left Eye  2. Epiretinal membrane (ERM) of left eye H35.372   3. Retinal edema H35.81 OCT, Retina - OU - Both Eyes  4.  Early dry stage nonexudative age-related macular degeneration of both eyes H35.3131   5. Hypertensive retinopathy of both eyes H35.033   6. Combined forms of age-related cataract of both eyes H25.813     1. Retinal tear, OS   - The incidence, risk factors, and natural history of retinal tear was discussed with patient.   - Potential treatment options including laser retinopexy and cryotherapy discussed with patient. - tear located at 0230 -- appears chronic w/ pigment surrounding and no SRF - recommend laser retinopexy OS today 11.20.19 - RBA of procedure discussed, questions answered -  informed consent obtained and signed - see procedure note - start PF QID x7 days - f/u in 1-2 wks  2,3. Epiretinal membrane, OS - The natural history, anatomy, potential for loss of vision, and treatment options including vitrectomy techniques and the complications of endophthalmitis, retinal detachment, vitreous hemorrhage, cataract progression and permanent vision loss discussed with the patient. - ERM with retinal edema and central cystic changes - asymptomatic, no metamorphopsia - suspect etiology related to old HST described above - no indication for surgery at this time - monitor for now - f/u 3 mos  4. Age related macular degeneration, non-exudative, both eyes  - early stage OU  - The incidence, anatomy, and pathology of dry AMD, risk of progression, and the AREDS and AREDS 2 study including smoking risks discussed with patient.  - Recommend amsler grid monitoring  5. Hypertensive retinopathy OU - pt with hypertensive like changes on exam -- vascular attenuation and scattered IRH/DBH OU -- but does not carry diagnosis of HTN or take any BP medications - discussed importance of tight BP control - monitor for now -- will initiate further work up once stable from laser and cataract surgeries  6. Mixed Cataracts OU - The symptoms of cataract, surgical options, and treatments and risks were discussed with patient. - discussed diagnosis and progression - under the expert management of Dr. Herbert Deaner - surgery OS is scheduled for 09/15/18 -- recommend postponing cataract removal until stable from laser retinopexy OS - discussed with Dr. Herbert Deaner   Ophthalmic Meds Ordered this visit:  No orders of the defined types were placed in this encounter.      Return in about 2 weeks (around 09/23/2018) for POV, s/p laser retinopexy OS for HST at 0230.  There are no Patient Instructions on file for this visit.   Explained the diagnoses, plan, and follow up with the patient and they expressed  understanding.  Patient expressed understanding of the importance of proper follow up care.   This document serves as a record of services personally performed by Gardiner Sleeper, MD, PhD. It was created on their behalf by Ernest Mallick, OA, an ophthalmic assistant. The creation of this record is the provider's dictation and/or activities during the visit.    Electronically signed by: Ernest Mallick, OA  11.19.19 12:51 PM    Gardiner Sleeper, M.D., Ph.D. Diseases & Surgery of the Retina and Vitreous [facility]   I have reviewed the above documentation for accuracy and completeness, and I agree with the above. Gardiner Sleeper, M.D., Ph.D. 09/09/18 12:51 PM    Abbreviations: M myopia (nearsighted); A astigmatism; H hyperopia (farsighted); P presbyopia; Mrx spectacle prescription;  CTL contact lenses; OD right eye; OS left eye; OU both eyes  XT exotropia; ET esotropia; PEK punctate epithelial keratitis; PEE punctate epithelial erosions; DES dry eye syndrome; MGD meibomian gland dysfunction; ATs artificial tears; PFAT's  preservative free artificial tears; [place] nuclear sclerotic cataract; PSC posterior subcapsular cataract; ERM epi-retinal membrane; PVD posterior vitreous detachment; RD retinal detachment; DM diabetes mellitus; DR diabetic retinopathy; NPDR non-proliferative diabetic retinopathy; PDR proliferative diabetic retinopathy; CSME clinically significant macular edema; DME diabetic macular edema; dbh dot blot hemorrhages; CWS cotton wool spot; POAG primary open angle glaucoma; C/D cup-to-disc ratio; HVF humphrey visual field; GVF goldmann visual field; OCT optical coherence tomography; IOP intraocular pressure; BRVO Branch retinal vein occlusion; CRVO central retinal vein occlusion; CRAO central retinal artery occlusion; BRAO branch retinal artery occlusion; RT retinal tear; SB scleral buckle; PPV pars plana vitrectomy; VH Vitreous hemorrhage; PRP panretinal laser  photocoagulation; IVK intravitreal kenalog; VMT vitreomacular traction; MH Macular hole;  NVD neovascularization of the disc; NVE neovascularization elsewhere; AREDS age related eye disease study; ARMD age related macular degeneration; POAG primary open angle glaucoma; EBMD epithelial/anterior basement membrane dystrophy; ACIOL anterior chamber intraocular lens; IOL intraocular lens; PCIOL posterior chamber intraocular lens; Phaco/IOL phacoemulsification with intraocular lens placement; [place] photorefractive keratectomy; LASIK laser assisted in situ keratomileusis; HTN hypertension; DM diabetes mellitus; COPD chronic obstructive pulmonary disease

## 2018-09-09 ENCOUNTER — Ambulatory Visit (INDEPENDENT_AMBULATORY_CARE_PROVIDER_SITE_OTHER): Payer: PPO | Admitting: Ophthalmology

## 2018-09-09 ENCOUNTER — Encounter (INDEPENDENT_AMBULATORY_CARE_PROVIDER_SITE_OTHER): Payer: Self-pay | Admitting: Ophthalmology

## 2018-09-09 DIAGNOSIS — H35372 Puckering of macula, left eye: Secondary | ICD-10-CM | POA: Diagnosis not present

## 2018-09-09 DIAGNOSIS — H35033 Hypertensive retinopathy, bilateral: Secondary | ICD-10-CM

## 2018-09-09 DIAGNOSIS — H33312 Horseshoe tear of retina without detachment, left eye: Secondary | ICD-10-CM | POA: Diagnosis not present

## 2018-09-09 DIAGNOSIS — H25813 Combined forms of age-related cataract, bilateral: Secondary | ICD-10-CM | POA: Diagnosis not present

## 2018-09-09 DIAGNOSIS — H353131 Nonexudative age-related macular degeneration, bilateral, early dry stage: Secondary | ICD-10-CM

## 2018-09-09 DIAGNOSIS — H3581 Retinal edema: Secondary | ICD-10-CM

## 2018-09-15 DIAGNOSIS — H2512 Age-related nuclear cataract, left eye: Secondary | ICD-10-CM | POA: Diagnosis not present

## 2018-09-25 NOTE — Progress Notes (Signed)
[facility] Note  09/28/2018     CHIEF COMPLAINT Patient presents for Post-op Follow-up   HISTORY OF PRESENT ILLNESS: Brenda Wiley is a 77 y.o. female who presents to the clinic today for:   HPI    Post-op Follow-up    In left eye.  Discomfort includes none.  Negative for pain, itching, foreign body sensation, tearing, discharge and floaters.  Vision is stable.  I, the attending physician,  performed the HPI with the patient and updated documentation appropriately.          Comments    Pt presents for retinal tears OS (s/p laser retinopexy 11/20), pt states vision is the same as last time, pt states she used drops as directed, pt states her balance is off, she states she fell on Friday, pt denies pain, flashes, floaters, or tearing       Last edited by Bernarda Caffey, MD on 09/28/2018  9:41 AM. (History)    pt states she had no issues with the laser procedure, pt states her balance has been off since the procedure and she fell when trying to sit in her chair on Friday, pt states she spoke with Dr. Herbert Deaner and was told that her cataract sx had been cancelled due to the laser procedure  Referring physician: Juliann Mule, [place], [place] [address]  HISTORICAL INFORMATION:   Selected notes from the MEDICAL RECORD NUMBER Referred by Dr. Monna Fam for concern of macular cyst / surgical clearance LEE: 11.11.19 (K. Hecker) [BCVA: OD: 20/25 OS: 20/30-] Ocular Hx-HTN ret, drusen, PVD, chemosis, optic nerve atrophy, arteriolar sclerosis PMH-anxiety, current smoker    CURRENT MEDICATIONS: Current Outpatient Medications (Ophthalmic Drugs)  Medication Sig  . ketorolac (ACULAR) 0.5 % ophthalmic solution INSTILL 1 DROP IN LEFT EYE FOUR TIMES A DAY START 1 WEEK PRIOR TO SURGERY  . ofloxacin (OCUFLOX) 0.3 % ophthalmic solution INSTILL 1 DROP IN BOTH EYES FOUR TIMES A DAY START 72 HOURS PRIOR TO SURGERY  . prednisoLONE  acetate (PRED FORTE) 1 % ophthalmic suspension INSTILL 1 DROP IN LEFT EYE FOUR TIMES A DAY START AFTER SURGERY   No current facility-administered medications for this visit.  (Ophthalmic Drugs)   Current Outpatient Medications (Other)  Medication Sig  . ALPRAZolam (XANAX) 0.25 MG tablet Take by mouth.  . ALPRAZolam (XANAX) 0.25 MG tablet TAKE 1 TABLET BY MOUTH NIGHTLY AS NEEDED FOR SLEEP  . FLUoxetine (PROZAC) 10 MG capsule Take 10 mg by mouth daily.   . Oxycodone HCl 10 MG TABS Take 10 mg by mouth 2 (two) times daily as needed. for pain  . Phentermine HCl 37.5 MG TBDP Take 18.75 mg by mouth daily.   No current facility-administered medications for this visit.  (Other)      REVIEW OF SYSTEMS: ROS    Positive for: Eyes   Negative for: Constitutional, Gastrointestinal, Neurological, Skin, Genitourinary, Musculoskeletal, HENT, Endocrine, Cardiovascular, Respiratory, Psychiatric, Allergic/Imm, Heme/Lymph   Last edited by Debbrah Alar, COT on 09/28/2018  9:28 AM. (History)       ALLERGIES Allergies  Allergen Reactions  . Asa [Aspirin]   . Ceclor [Cefaclor]   . Codeine Itching    Will take if I have to    PAST MEDICAL HISTORY Past Medical History:  Diagnosis Date  . Anal sphincter tear (healed) (old)-anterior 04/13/2014  . Basal cell carcinoma   . Chronic headaches    resolved  . Depression    loss  of spouse  . Diverticulosis   . Dizzy spells 02/23/2015   X 1year  . HLD (hyperlipidemia)    Past Surgical History:  Procedure Laterality Date  . CESAREAN SECTION     x 2  . COLONOSCOPY    . PARTIAL HYSTERECTOMY    . TONSILLECTOMY      FAMILY HISTORY Family History  Problem Relation Age of Onset  . Heart disease Father   . Heart disease Mother   . Cataracts Mother   . Diabetes Other        maternal side  . Heart disease Brother   . Cancer Maternal Aunt        type unknown  . Diverticulitis Other        nephew x 2  . Cataracts Sister   . Amblyopia Neg Hx   .  Blindness Neg Hx   . Glaucoma Neg Hx   . Macular degeneration Neg Hx   . Retinal detachment Neg Hx   . Strabismus Neg Hx   . Retinitis pigmentosa Neg Hx     SOCIAL HISTORY Social History   Tobacco Use  . Smoking status: Former Smoker    Types: Cigarettes    Last attempt to quit: 10/21/2012    Years since quitting: 5.9  . Smokeless tobacco: Never Used  Substance Use Topics  . Alcohol use: No    Comment: occasional-beer  . Drug use: No         OPHTHALMIC EXAM:  Base Eye Exam    Visual Acuity (Snellen - Linear)      Right Left   Dist cc 20/80 20/70 -1   Dist ph cc 20/40 20/50       Tonometry (Tonopen, 9:36 AM)      Right Left   Pressure 23 23       Tonometry #2 (Tonopen, 9:36 AM)      Right Left   Pressure 23 16       Pupils      Dark Light Shape React APD   Right 4 3 Round Brisk None   Left 4 3 Round Brisk None       Neuro/Psych    Oriented x3:  Yes   Mood/Affect:  Normal       Dilation    Left eye:  1.0% Mydriacyl, 2.5% Phenylephrine @ 9:36 AM        Slit Lamp and Fundus Exam    Slit Lamp Exam      Right Left   Lids/Lashes Dermatochalasis - upper lid, mild Meibomian gland dysfunction, mild Telangiectasia Dermatochalasis - upper lid, mild Meibomian gland dysfunction, mild Telangiectasia   Conjunctiva/Sclera White and quiet White and quiet   Cornea Arcus, 1+ Punctate epithelial erosions Arcus, 1+ Punctate epithelial erosions   Anterior Chamber Deep and clear Deep and clear   Iris Round and dilated Round and dilated   Lens 2-3+ Nuclear sclerosis, 2+ Cortical cataract 3+ Nuclear sclerosis, 2-3+ Cortical cataract   Vitreous Vitreous syneresis Vitreous syneresis, Posterior vitreous detachment, Weiss ring       Fundus Exam      Right Left   Disc  Pink and Sharp   C/D Ratio 0.3 0.5   Macula  Flat, Blunted foveal reflex, Epiretinal membrane, Retinal pigment epithelial mottling, No heme or edema   Vessels  mild Vascular attenuation, Copper wiring, AV  crossing changes   Periphery  Attached, dot hemes temporally, horseshoe tear at 0230 with pigment surrounding, no SRF - good laser  surrounding          IMAGING AND PROCEDURES  Imaging and Procedures for '@TODAY'$ @           ASSESSMENT/PLAN:    ICD-10-CM   1. Retinal tear of left eye H33.312   2. Epiretinal membrane (ERM) of left eye H35.372   3. Retinal edema H35.81   4. Early dry stage nonexudative age-related macular degeneration of both eyes H35.3131   5. Hypertensive retinopathy of both eyes H35.033   6. Combined forms of age-related cataract of both eyes H25.813     1. Retinal tear, OS   - tear located at 0230 -- appears chronic w/ pigment surrounding and no SRF - s/p laser retinopexy OS (11.20.19) -- good laser changes surrounding - completed PF QID OS x7 days - f/u in 2 wks for DFE OU  2,3. Epiretinal membrane, OS - The natural history, anatomy, potential for loss of vision, and treatment options including vitrectomy techniques and the complications of endophthalmitis, retinal detachment, vitreous hemorrhage, cataract progression and permanent vision loss discussed with the patient. - ERM with retinal edema and central cystic changes - asymptomatic, no metamorphopsia - suspect etiology related to old HST described above - no indication for surgery at this time - monitor for now - f/u 3 mos  4. Age related macular degeneration, non-exudative, both eyes  - early stage OU  - The incidence, anatomy, and pathology of dry AMD, risk of progression, and the AREDS and AREDS 2 study including smoking risks discussed with patient.  - Recommend amsler grid monitoring  5. Hypertensive retinopathy OU - pt with hypertensive like changes on exam -- vascular attenuation and scattered IRH/DBH OU -- but does not carry diagnosis of HTN or take any BP medications - discussed importance of tight BP control - monitor for now -- will initiate further work up once stable from laser and  cataract surgeries  6. Mixed Cataracts OU - The symptoms of cataract, surgical options, and treatments and risks were discussed with patient. - discussed diagnosis and progression - under the expert management of Dr. Herbert Deaner - surgery OS has been postponed due to laser retinopexy OS - will clear for surgery once laser is stable    Ophthalmic Meds Ordered this visit:  No orders of the defined types were placed in this encounter.      Return in about 11 days (around 10/09/2018) for  retinal tear OS, DFE OU, OCT.  There are no Patient Instructions on file for this visit.   Explained the diagnoses, plan, and follow up with the patient and they expressed understanding.  Patient expressed understanding of the importance of proper follow up care.   This document serves as a record of services personally performed by Gardiner Sleeper, MD, PhD. It was created on their behalf by Ernest Mallick, OA, an ophthalmic assistant. The creation of this record is the provider's dictation and/or activities during the visit.    Electronically signed by: Ernest Mallick, OA  12.06.19 11:44 PM    Gardiner Sleeper, M.D., Ph.D. Diseases & Surgery of the Retina and Vitreous [facility]  I have reviewed the above documentation for accuracy and completeness, and I agree with the above. Gardiner Sleeper, M.D., Ph.D. 09/28/18 11:46 PM     Abbreviations: M myopia (nearsighted); A astigmatism; H hyperopia (farsighted); P presbyopia; Mrx spectacle prescription;  CTL contact lenses; OD right eye; OS left eye; OU both eyes  XT exotropia; ET esotropia; PEK  punctate epithelial keratitis; PEE punctate epithelial erosions; DES dry eye syndrome; MGD meibomian gland dysfunction; ATs artificial tears; PFAT's preservative free artificial tears; [place] nuclear sclerotic cataract; PSC posterior subcapsular cataract; ERM epi-retinal membrane; PVD posterior vitreous detachment; RD retinal detachment; DM diabetes  mellitus; DR diabetic retinopathy; NPDR non-proliferative diabetic retinopathy; PDR proliferative diabetic retinopathy; CSME clinically significant macular edema; DME diabetic macular edema; dbh dot blot hemorrhages; CWS cotton wool spot; POAG primary open angle glaucoma; C/D cup-to-disc ratio; HVF humphrey visual field; GVF goldmann visual field; OCT optical coherence tomography; IOP intraocular pressure; BRVO Branch retinal vein occlusion; CRVO central retinal vein occlusion; CRAO central retinal artery occlusion; BRAO branch retinal artery occlusion; RT retinal tear; SB scleral buckle; PPV pars plana vitrectomy; VH Vitreous hemorrhage; PRP panretinal laser photocoagulation; IVK intravitreal kenalog; VMT vitreomacular traction; MH Macular hole;  NVD neovascularization of the disc; NVE neovascularization elsewhere; AREDS age related eye disease study; ARMD age related macular degeneration; POAG primary open angle glaucoma; EBMD epithelial/anterior basement membrane dystrophy; ACIOL anterior chamber intraocular lens; IOL intraocular lens; PCIOL posterior chamber intraocular lens; Phaco/IOL phacoemulsification with intraocular lens placement; [place] photorefractive keratectomy; LASIK laser assisted in situ keratomileusis; HTN hypertension; DM diabetes mellitus; COPD chronic obstructive pulmonary disease

## 2018-09-28 ENCOUNTER — Encounter (INDEPENDENT_AMBULATORY_CARE_PROVIDER_SITE_OTHER): Payer: Self-pay | Admitting: Ophthalmology

## 2018-09-28 ENCOUNTER — Ambulatory Visit (INDEPENDENT_AMBULATORY_CARE_PROVIDER_SITE_OTHER): Payer: PPO | Admitting: Ophthalmology

## 2018-09-28 DIAGNOSIS — H35372 Puckering of macula, left eye: Secondary | ICD-10-CM

## 2018-09-28 DIAGNOSIS — H35033 Hypertensive retinopathy, bilateral: Secondary | ICD-10-CM

## 2018-09-28 DIAGNOSIS — H353131 Nonexudative age-related macular degeneration, bilateral, early dry stage: Secondary | ICD-10-CM

## 2018-09-28 DIAGNOSIS — H33312 Horseshoe tear of retina without detachment, left eye: Secondary | ICD-10-CM

## 2018-09-28 DIAGNOSIS — H25813 Combined forms of age-related cataract, bilateral: Secondary | ICD-10-CM

## 2018-09-28 DIAGNOSIS — H3581 Retinal edema: Secondary | ICD-10-CM

## 2018-10-06 NOTE — Progress Notes (Signed)
[facility] Note  10/07/2018     CHIEF COMPLAINT Patient presents for Post-op Follow-up   HISTORY OF PRESENT ILLNESS: Brenda Wiley is a 77 y.o. female who presents to the clinic today for:   HPI    Post-op Follow-up    In left eye.  Discomfort includes none.  Vision is improved and is blurred at distance.  I, the attending physician,  performed the HPI with the patient and updated documentation appropriately.          Comments    77 y/o female pt here for f/u s/p laser retinopexy OS on 11.20.19.  No change in [place] OU.  Denies pain, flashes, floaters.  No gtts.       Last edited by Bernarda Caffey, MD on 10/07/2018  1:09 PM. (History)    pt denies new flashes or floaters  Referring physician: Monna Fam, [place], [place] [address]  HISTORICAL INFORMATION:   Selected notes from the MEDICAL RECORD NUMBER Referred by Dr. Monna Fam for concern of macular cyst / surgical clearance LEE: 11.11.19 (K. Hecker) [BCVA: OD: 20/25 OS: 20/30-] Ocular Hx-HTN ret, drusen, PVD, chemosis, optic nerve atrophy, arteriolar sclerosis PMH-anxiety, current smoker    CURRENT MEDICATIONS: Current Outpatient Medications (Ophthalmic Drugs)  Medication Sig  . ketorolac (ACULAR) 0.5 % ophthalmic solution INSTILL 1 DROP IN LEFT EYE FOUR TIMES A DAY START 1 WEEK PRIOR TO SURGERY  . ofloxacin (OCUFLOX) 0.3 % ophthalmic solution INSTILL 1 DROP IN BOTH EYES FOUR TIMES A DAY START 72 HOURS PRIOR TO SURGERY  . prednisoLONE acetate (PRED FORTE) 1 % ophthalmic suspension INSTILL 1 DROP IN LEFT EYE FOUR TIMES A DAY START AFTER SURGERY   No current facility-administered medications for this visit.  (Ophthalmic Drugs)   Current Outpatient Medications (Other)  Medication Sig  . ALPRAZolam (XANAX) 0.25 MG tablet Take by mouth.  . ALPRAZolam (XANAX) 0.25 MG tablet TAKE 1 TABLET BY MOUTH NIGHTLY AS NEEDED FOR SLEEP  . FLUoxetine (PROZAC) 10 MG  capsule Take 10 mg by mouth daily.   . Oxycodone HCl 10 MG TABS Take 10 mg by mouth 2 (two) times daily as needed. for pain  . Phentermine HCl 37.5 MG TBDP Take 18.75 mg by mouth daily.   No current facility-administered medications for this visit.  (Other)      REVIEW OF SYSTEMS: ROS    Positive for: Eyes, Psychiatric   Negative for: Constitutional, Gastrointestinal, Neurological, Skin, Genitourinary, Musculoskeletal, HENT, Endocrine, Cardiovascular, Respiratory, Allergic/Imm, Heme/Lymph   Last edited by Matthew Folks, COA on 10/07/2018 10:14 AM. (History)       ALLERGIES Allergies  Allergen Reactions  . Asa [Aspirin]   . Ceclor [Cefaclor]   . Codeine Itching    Will take if I have to    PAST MEDICAL HISTORY Past Medical History:  Diagnosis Date  . Anal sphincter tear (healed) (old)-anterior 04/13/2014  . Basal cell carcinoma   . Chronic headaches    resolved  . Depression    loss of spouse  . Diverticulosis   . Dizzy spells 02/23/2015   X 1year  . HLD (hyperlipidemia)    Past Surgical History:  Procedure Laterality Date  . CESAREAN SECTION     x 2  . COLONOSCOPY    . PARTIAL HYSTERECTOMY    . TONSILLECTOMY      FAMILY HISTORY Family History  Problem Relation Age of Onset  . Heart disease Father   .  Heart disease Mother   . Cataracts Mother   . Diabetes Other        maternal side  . Heart disease Brother   . Cancer Maternal Aunt        type unknown  . Diverticulitis Other        nephew x 2  . Cataracts Sister   . Amblyopia Neg Hx   . Blindness Neg Hx   . Glaucoma Neg Hx   . Macular degeneration Neg Hx   . Retinal detachment Neg Hx   . Strabismus Neg Hx   . Retinitis pigmentosa Neg Hx     SOCIAL HISTORY Social History   Tobacco Use  . Smoking status: Former Smoker    Types: Cigarettes    Last attempt to quit: 10/21/2012    Years since quitting: 5.9  . Smokeless tobacco: Never Used  Substance Use Topics  . Alcohol use: No    Comment:  occasional-beer  . Drug use: No         OPHTHALMIC EXAM:  Base Eye Exam    Visual Acuity (Snellen - Linear)      Right Left   Dist cc 20/70 -2 20/40 -   Dist ph cc 20/40 NI   Correction:  Glasses       Tonometry (Tonopen, 10:15 AM)      Right Left   Pressure 22 22       Pupils      Dark Light Shape React APD   Right 4 3 Round Brisk None   Left 4 3 Round Brisk None       Visual Fields (Counting fingers)      Left Right    Full Full       Extraocular Movement      Right Left    Full, Ortho Full, Ortho       Neuro/Psych    Oriented x3:  Yes   Mood/Affect:  Normal       Dilation    Both eyes:  1.0% Mydriacyl, 2.5% Phenylephrine @ 10:15 AM        Slit Lamp and Fundus Exam    Slit Lamp Exam      Right Left   Lids/Lashes Dermatochalasis - upper lid, mild Meibomian gland dysfunction, mild Telangiectasia Dermatochalasis - upper lid, mild Meibomian gland dysfunction, mild Telangiectasia   Conjunctiva/Sclera White and quiet White and quiet   Cornea Arcus, 1+ Punctate epithelial erosions Arcus, 1+ Punctate epithelial erosions   Anterior Chamber Deep and clear Deep and clear   Iris Round and dilated Round and dilated   Lens 2-3+ Nuclear sclerosis, 2+ Cortical cataract 3+ Nuclear sclerosis, 2-3+ Cortical cataract   Vitreous Vitreous syneresis Vitreous syneresis, Posterior vitreous detachment, Weiss ring       Fundus Exam      Right Left   Disc Pink and Sharp Pink and Sharp   C/D Ratio 0.3 0.5   Macula Flat, Blunted foveal reflex, Retinal pigment epithelial mottling, no edema Flat, Blunted foveal reflex, Epiretinal membrane, Retinal pigment epithelial mottling, No heme or edema   Vessels mild Vascular attenuation, Copper wiring, AV crossing changes mild Vascular attenuation, Copper wiring, AV crossing changes   Periphery Attached, single blot heme at 0800 equator, 2 pigmented CR scars at 0300 midzone, hypopigmented VR tufts at 1030  Attached, horseshoe tear at 0230  with pigment surrounding, no SRF - good laser surrounding, fully mature          IMAGING AND PROCEDURES  Imaging and Procedures for '@TODAY'$ @           ASSESSMENT/PLAN:    ICD-10-CM   1. Retinal tear of left eye H33.312   2. Epiretinal membrane (ERM) of left eye H35.372   3. Retinal edema H35.81   4. Early dry stage nonexudative age-related macular degeneration of both eyes H35.3131   5. Hypertensive retinopathy of both eyes H35.033   6. Combined forms of age-related cataract of both eyes H25.813     1. Retinal tear, OS   - tear located at 0230 -- appears chronic w/ pigment surrounding and no SRF - s/p laser retinopexy OS (11.20.19) -- good laser changes surrounding - completed PF QID OS x7 days - now clear from a retina standpoint to proceed with cataract surgery when pt and surgeon are ready - f/u in 10 wks for DFE, OCT OU  2,3. Epiretinal membrane, OS - The natural history, anatomy, potential for loss of vision, and treatment options including vitrectomy techniques and the complications of endophthalmitis, retinal detachment, vitreous hemorrhage, cataract progression and permanent vision loss discussed with the patient. - ERM with retinal edema and central cystic changes - asymptomatic, no metamorphopsia - suspect etiology related to old HST described above - no indication for surgery at this time - monitor for now - f/u 3 mos  4. Age related macular degeneration, non-exudative, both eyes  - early stage OU  - The incidence, anatomy, and pathology of dry AMD, risk of progression, and the AREDS and AREDS 2 study including smoking risks discussed with patient.  - Recommend amsler grid monitoring  5. Hypertensive retinopathy OU - pt with hypertensive like changes on exam -- vascular attenuation and scattered IRH/DBH OU -- but does not carry diagnosis of HTN or take any BP medications - discussed importance of tight BP control - monitor for now -- will initiate further  work up once stable from laser and cataract surgeries  6. Mixed Cataracts OU - The symptoms of cataract, surgical options, and treatments and risks were discussed with patient. - discussed diagnosis and progression - under the expert management of Dr. Herbert Deaner - surgery OS has been postponed due to laser retinopexy OS - now that laser is stable, clear from a retina standpoint to proceed with cataract surgery when pt and surgeon are ready    Ophthalmic Meds Ordered this visit:  No orders of the defined types were placed in this encounter.      Return in about 10 weeks (around 12/16/2018) for F/U retinal tear OS, DFE, OCT.  There are no Patient Instructions on file for this visit.   Explained the diagnoses, plan, and follow up with the patient and they expressed understanding.  Patient expressed understanding of the importance of proper follow up care.   This document serves as a record of services personally performed by Gardiner Sleeper, MD, PhD. It was created on their behalf by Ernest Mallick, OA, an ophthalmic assistant. The creation of this record is the provider's dictation and/or activities during the visit.    Electronically signed by: Ernest Mallick, OA  12.17.19 1:09 PM    Gardiner Sleeper, M.D., Ph.D. Diseases & Surgery of the Retina and Vitreous [facility]  I have reviewed the above documentation for accuracy and completeness, and I agree with the above. Gardiner Sleeper, M.D., Ph.D. 10/07/18 1:11 PM     Abbreviations: M myopia (nearsighted); A astigmatism; H hyperopia (farsighted); P presbyopia; Mrx spectacle prescription;  CTL  contact lenses; OD right eye; OS left eye; OU both eyes  XT exotropia; ET esotropia; PEK punctate epithelial keratitis; PEE punctate epithelial erosions; DES dry eye syndrome; MGD meibomian gland dysfunction; ATs artificial tears; PFAT's preservative free artificial tears; [place] nuclear sclerotic cataract; PSC posterior  subcapsular cataract; ERM epi-retinal membrane; PVD posterior vitreous detachment; RD retinal detachment; DM diabetes mellitus; DR diabetic retinopathy; NPDR non-proliferative diabetic retinopathy; PDR proliferative diabetic retinopathy; CSME clinically significant macular edema; DME diabetic macular edema; dbh dot blot hemorrhages; CWS cotton wool spot; POAG primary open angle glaucoma; C/D cup-to-disc ratio; HVF humphrey visual field; GVF goldmann visual field; OCT optical coherence tomography; IOP intraocular pressure; BRVO Branch retinal vein occlusion; CRVO central retinal vein occlusion; CRAO central retinal artery occlusion; BRAO branch retinal artery occlusion; RT retinal tear; SB scleral buckle; PPV pars plana vitrectomy; VH Vitreous hemorrhage; PRP panretinal laser photocoagulation; IVK intravitreal kenalog; VMT vitreomacular traction; MH Macular hole;  NVD neovascularization of the disc; NVE neovascularization elsewhere; AREDS age related eye disease study; ARMD age related macular degeneration; POAG primary open angle glaucoma; EBMD epithelial/anterior basement membrane dystrophy; ACIOL anterior chamber intraocular lens; IOL intraocular lens; PCIOL posterior chamber intraocular lens; Phaco/IOL phacoemulsification with intraocular lens placement; [place] photorefractive keratectomy; LASIK laser assisted in situ keratomileusis; HTN hypertension; DM diabetes mellitus; COPD chronic obstructive pulmonary disease

## 2018-10-07 ENCOUNTER — Encounter (INDEPENDENT_AMBULATORY_CARE_PROVIDER_SITE_OTHER): Payer: Self-pay | Admitting: Ophthalmology

## 2018-10-07 ENCOUNTER — Ambulatory Visit (INDEPENDENT_AMBULATORY_CARE_PROVIDER_SITE_OTHER): Payer: PPO | Admitting: Ophthalmology

## 2018-10-07 DIAGNOSIS — H3581 Retinal edema: Secondary | ICD-10-CM

## 2018-10-07 DIAGNOSIS — H25813 Combined forms of age-related cataract, bilateral: Secondary | ICD-10-CM

## 2018-10-07 DIAGNOSIS — H33312 Horseshoe tear of retina without detachment, left eye: Secondary | ICD-10-CM

## 2018-10-07 DIAGNOSIS — H35033 Hypertensive retinopathy, bilateral: Secondary | ICD-10-CM

## 2018-10-07 DIAGNOSIS — H353131 Nonexudative age-related macular degeneration, bilateral, early dry stage: Secondary | ICD-10-CM

## 2018-10-07 DIAGNOSIS — H35372 Puckering of macula, left eye: Secondary | ICD-10-CM

## 2018-10-09 ENCOUNTER — Encounter (INDEPENDENT_AMBULATORY_CARE_PROVIDER_SITE_OTHER): Payer: PPO | Admitting: Ophthalmology

## 2018-10-27 DIAGNOSIS — H25812 Combined forms of age-related cataract, left eye: Secondary | ICD-10-CM | POA: Diagnosis not present

## 2018-10-27 DIAGNOSIS — H2512 Age-related nuclear cataract, left eye: Secondary | ICD-10-CM | POA: Diagnosis not present

## 2018-11-04 DIAGNOSIS — H2512 Age-related nuclear cataract, left eye: Secondary | ICD-10-CM | POA: Diagnosis not present

## 2018-11-16 DIAGNOSIS — G894 Chronic pain syndrome: Secondary | ICD-10-CM | POA: Diagnosis not present

## 2018-11-16 DIAGNOSIS — Z79899 Other long term (current) drug therapy: Secondary | ICD-10-CM | POA: Diagnosis not present

## 2018-11-16 DIAGNOSIS — E7800 Pure hypercholesterolemia, unspecified: Secondary | ICD-10-CM | POA: Diagnosis not present

## 2018-11-23 DIAGNOSIS — H2511 Age-related nuclear cataract, right eye: Secondary | ICD-10-CM | POA: Diagnosis not present

## 2018-11-30 DIAGNOSIS — F3341 Major depressive disorder, recurrent, in partial remission: Secondary | ICD-10-CM | POA: Diagnosis not present

## 2018-11-30 DIAGNOSIS — E7800 Pure hypercholesterolemia, unspecified: Secondary | ICD-10-CM | POA: Diagnosis not present

## 2018-11-30 DIAGNOSIS — R002 Palpitations: Secondary | ICD-10-CM | POA: Diagnosis not present

## 2018-11-30 DIAGNOSIS — R42 Dizziness and giddiness: Secondary | ICD-10-CM | POA: Diagnosis not present

## 2018-11-30 DIAGNOSIS — J431 Panlobular emphysema: Secondary | ICD-10-CM | POA: Diagnosis not present

## 2018-11-30 DIAGNOSIS — R7309 Other abnormal glucose: Secondary | ICD-10-CM | POA: Diagnosis not present

## 2018-11-30 DIAGNOSIS — R55 Syncope and collapse: Secondary | ICD-10-CM | POA: Diagnosis not present

## 2018-11-30 DIAGNOSIS — R0609 Other forms of dyspnea: Secondary | ICD-10-CM | POA: Diagnosis not present

## 2018-12-03 ENCOUNTER — Other Ambulatory Visit: Payer: Self-pay | Admitting: Internal Medicine

## 2018-12-03 DIAGNOSIS — R42 Dizziness and giddiness: Secondary | ICD-10-CM

## 2018-12-03 DIAGNOSIS — R55 Syncope and collapse: Secondary | ICD-10-CM

## 2018-12-08 DIAGNOSIS — H25811 Combined forms of age-related cataract, right eye: Secondary | ICD-10-CM | POA: Diagnosis not present

## 2018-12-08 DIAGNOSIS — H2511 Age-related nuclear cataract, right eye: Secondary | ICD-10-CM | POA: Diagnosis not present

## 2018-12-13 ENCOUNTER — Ambulatory Visit
Admission: RE | Admit: 2018-12-13 | Discharge: 2018-12-13 | Disposition: A | Discharge status: Home / Self Care | Payer: PPO | Source: Ambulatory Visit | Attending: Internal Medicine | Admitting: Internal Medicine

## 2018-12-13 DIAGNOSIS — R42 Dizziness and giddiness: Secondary | ICD-10-CM

## 2018-12-13 DIAGNOSIS — R55 Syncope and collapse: Secondary | ICD-10-CM | POA: Diagnosis not present

## 2018-12-16 ENCOUNTER — Encounter (INDEPENDENT_AMBULATORY_CARE_PROVIDER_SITE_OTHER): Payer: PPO | Admitting: Ophthalmology

## 2018-12-17 DIAGNOSIS — H2511 Age-related nuclear cataract, right eye: Secondary | ICD-10-CM | POA: Diagnosis not present

## 2019-01-01 ENCOUNTER — Encounter (INDEPENDENT_AMBULATORY_CARE_PROVIDER_SITE_OTHER): Payer: PPO | Admitting: Ophthalmology

## 2019-01-05 ENCOUNTER — Encounter (INDEPENDENT_AMBULATORY_CARE_PROVIDER_SITE_OTHER): Payer: PPO | Admitting: Ophthalmology

## 2019-02-15 DIAGNOSIS — R55 Syncope and collapse: Secondary | ICD-10-CM | POA: Diagnosis not present

## 2019-02-15 DIAGNOSIS — F3341 Major depressive disorder, recurrent, in partial remission: Secondary | ICD-10-CM | POA: Diagnosis not present

## 2019-02-15 DIAGNOSIS — R0602 Shortness of breath: Secondary | ICD-10-CM | POA: Diagnosis not present

## 2019-02-19 DIAGNOSIS — Z79899 Other long term (current) drug therapy: Secondary | ICD-10-CM | POA: Diagnosis not present

## 2019-02-19 DIAGNOSIS — E7800 Pure hypercholesterolemia, unspecified: Secondary | ICD-10-CM | POA: Diagnosis not present

## 2019-02-26 DIAGNOSIS — F3341 Major depressive disorder, recurrent, in partial remission: Secondary | ICD-10-CM | POA: Diagnosis not present

## 2019-02-26 DIAGNOSIS — G894 Chronic pain syndrome: Secondary | ICD-10-CM | POA: Diagnosis not present

## 2019-05-26 IMAGING — US US ABDOMEN COMPLETE
1 series · 13 of 25 positions shown · non-contrast
Comparison: 05/08/2017 renal sonogram.  05/08/2010 CT.

CLINICAL DATA: 77-year-old female with right upper quadrant pain
for 2 months. Initial encounter.

EXAM:
ABDOMEN ULTRASOUND COMPLETE

[Series 1: us abdomen complete · 0.14mm/px · 13 of 113 slices shown]
[im 1/113]
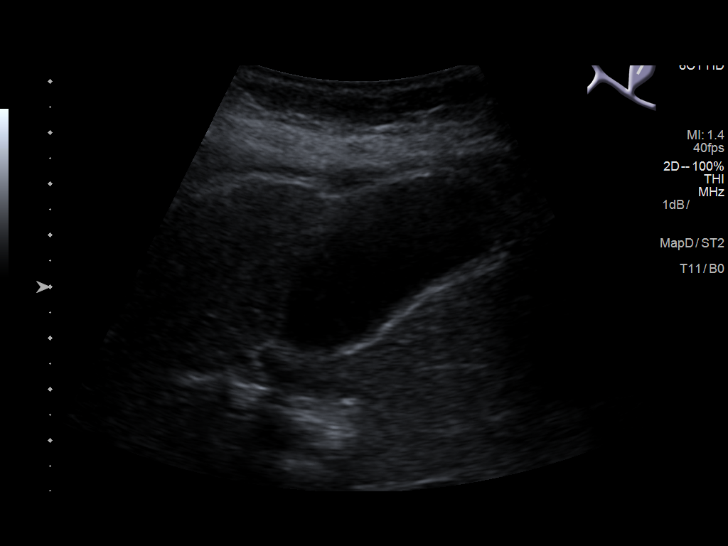
[im 10/113]
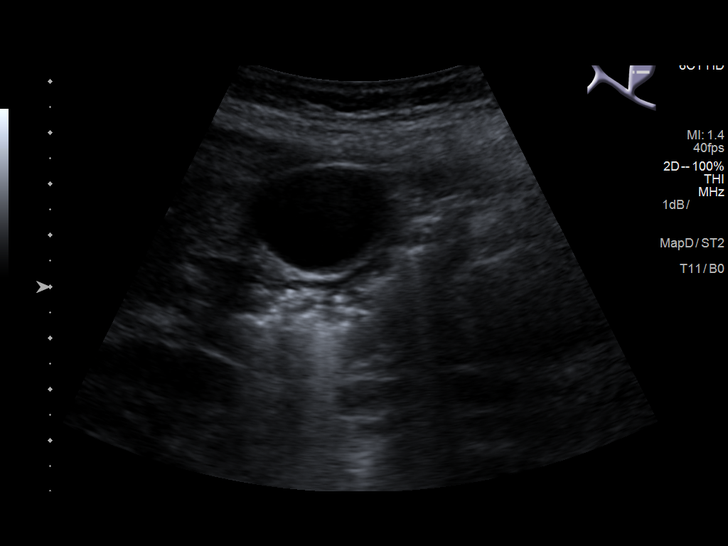
[im 19/113]
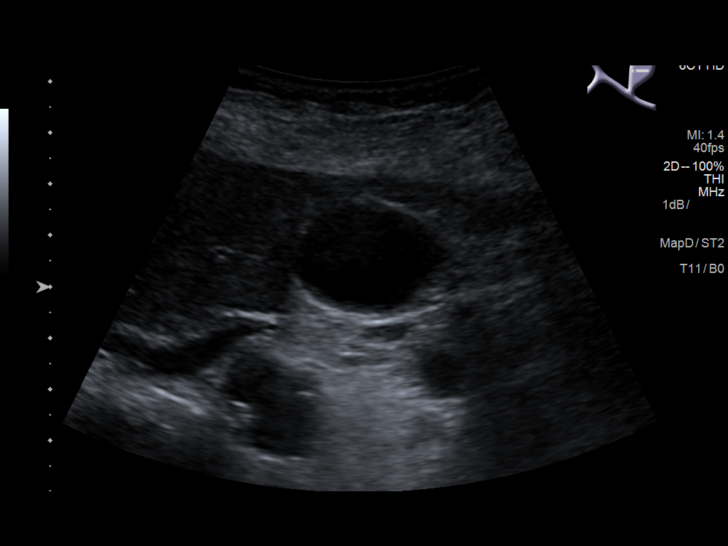
[im 29/113]
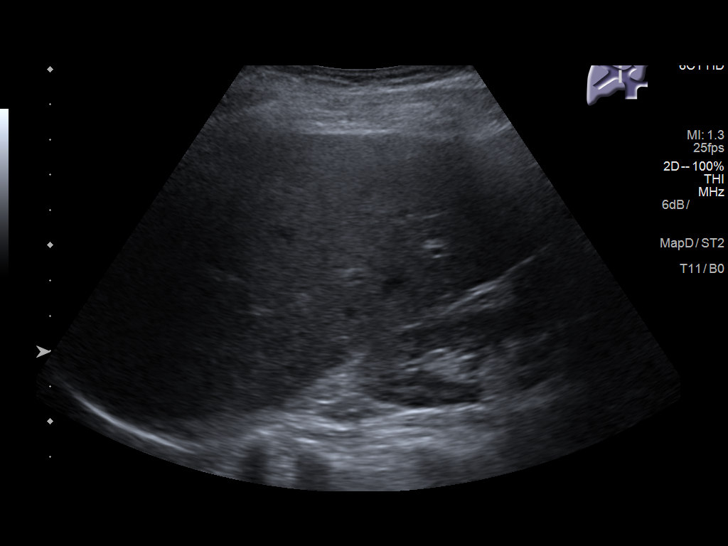
[im 38/113]
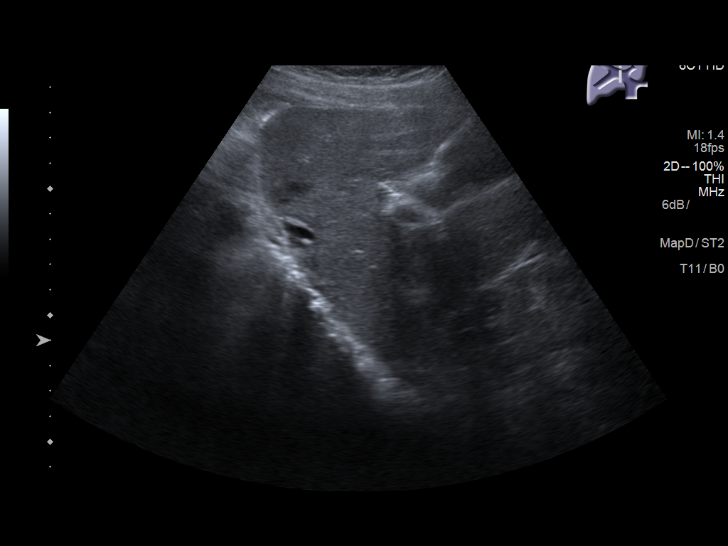
[im 47/113]
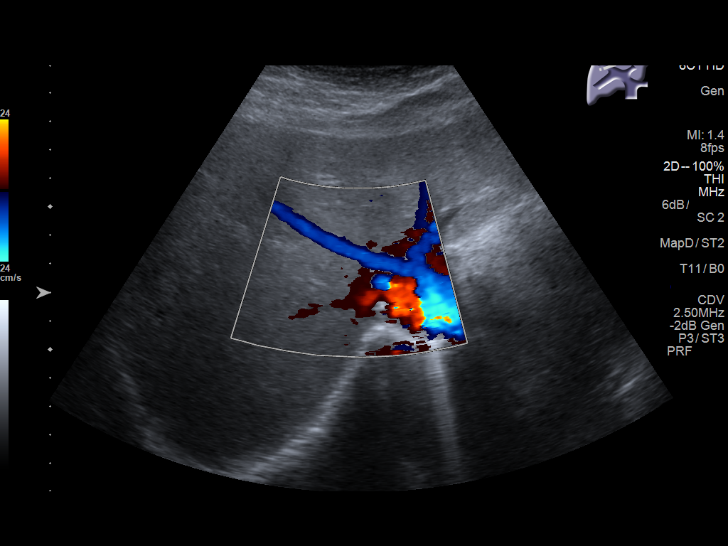
[im 57/113]
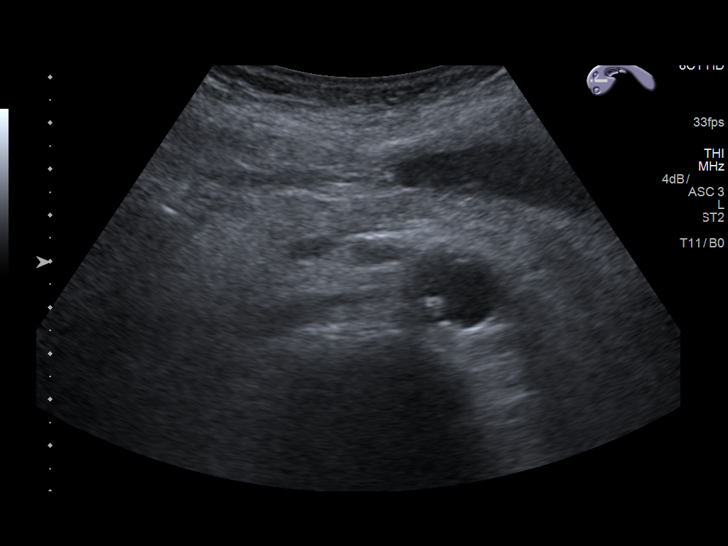
[im 66/113]
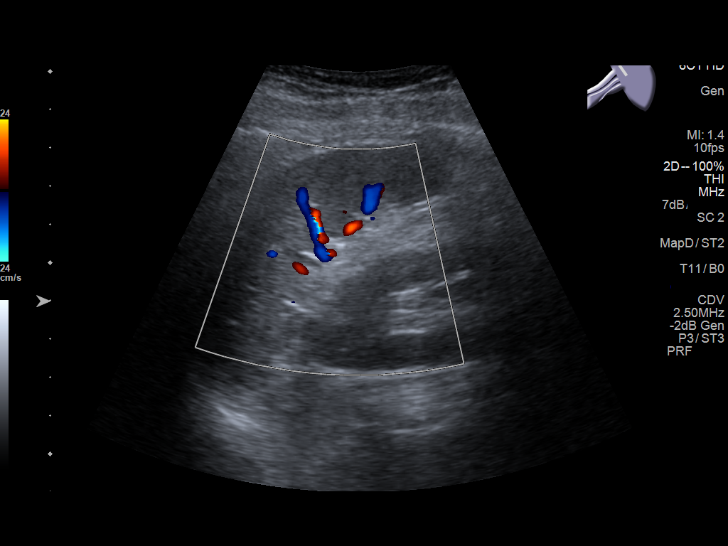
[im 75/113]
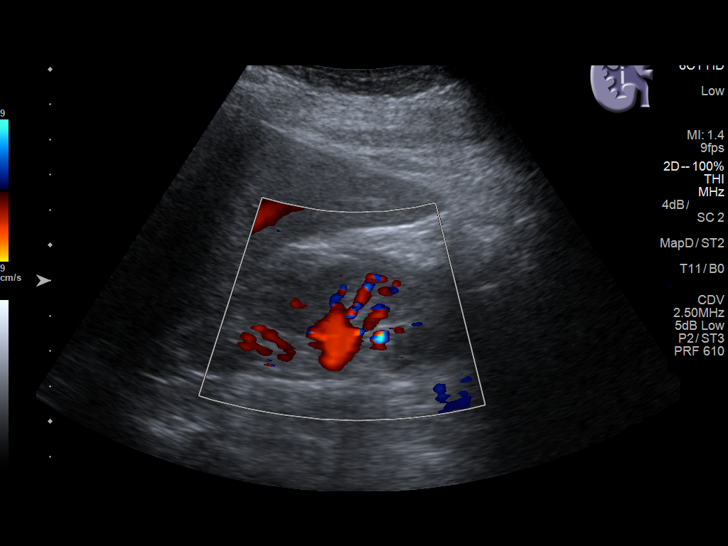
[im 85/113]
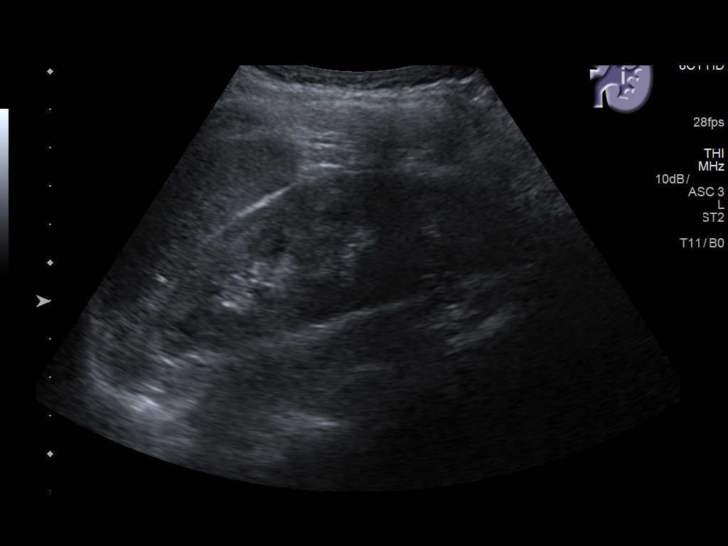
[im 94/113]
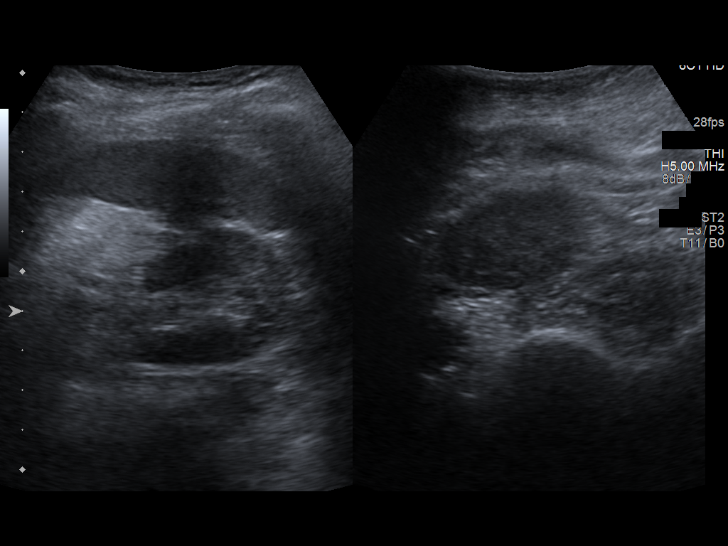
[im 103/113]
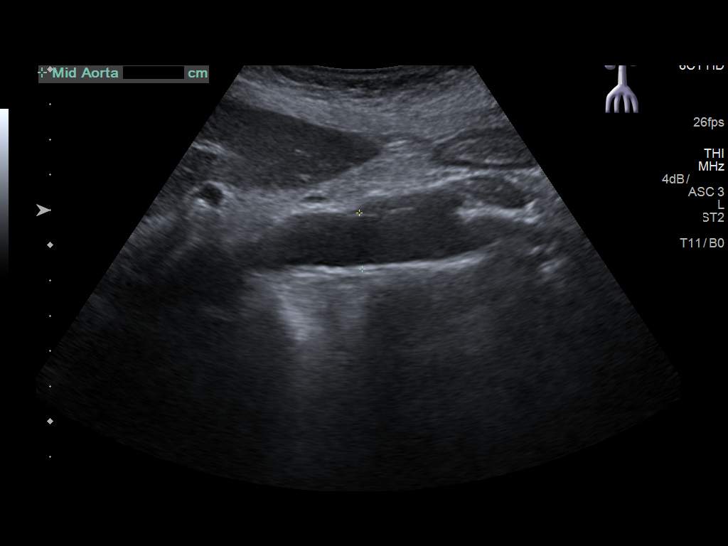
[im 113/113]
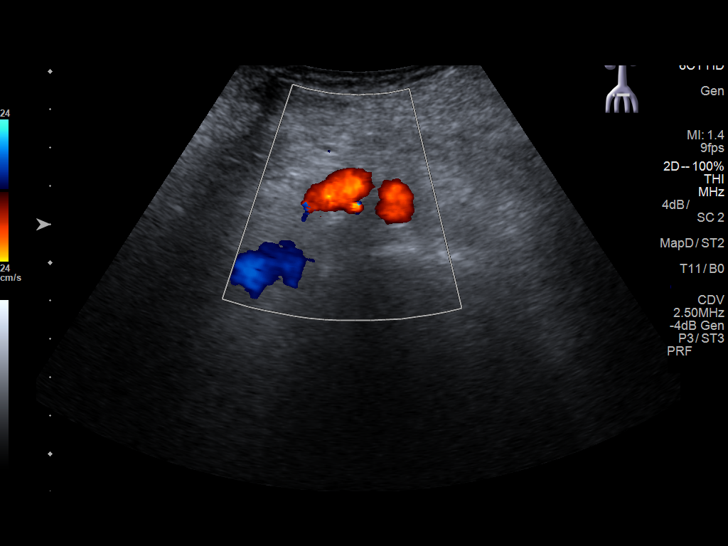

[13 of 25 positions shown; findings below may reference images not displayed]

FINDINGS: Gallbladder: No gallstones or wall thickening visualized. No
sonographic Murphy sign noted by sonographer.

Common bile duct: Diameter: 3 mm

Liver: No focal lesion identified. Within normal limits in
parenchymal echogenicity. Portal vein is patent on color Doppler
imaging with normal direction of blood flow towards the liver.

IVC: No abnormality visualized.

Pancreas: Visualized portion unremarkable.

Spleen: Size and appearance within normal limits.

Right Kidney: Length: 9.3 cm. Echogenicity within normal limits. No
mass or hydronephrosis visualized.

Left Kidney: Length: 11 cm. Echogenicity within normal limits. No
mass or hydronephrosis visualized.

Abdominal aorta: Atherosclerotic changes. Ectasia with small
aneurysm upper abdominal aorta measuring 3.1 cm.

Other findings: None.
IMPRESSION: 1. Aortic Atherosclerosis ([id number]). Ectatic abdominal aorta
with small aneurysm upper abdominal aorta measuring up to 3.1 cm.
Recommend followup by ultrasound in 3 years. This recommendation
follows ACR consensus guidelines: White Paper of the ACR Incidental
Findings Committee II on Vascular Findings. [HOSPITAL] 4657;
2. Otherwise negative abdominal sonogram.

## 2019-06-25 DIAGNOSIS — J431 Panlobular emphysema: Secondary | ICD-10-CM | POA: Diagnosis not present

## 2019-06-25 DIAGNOSIS — G894 Chronic pain syndrome: Secondary | ICD-10-CM | POA: Diagnosis not present

## 2019-06-25 DIAGNOSIS — F3341 Major depressive disorder, recurrent, in partial remission: Secondary | ICD-10-CM | POA: Diagnosis not present

## 2019-06-25 DIAGNOSIS — J449 Chronic obstructive pulmonary disease, unspecified: Secondary | ICD-10-CM | POA: Diagnosis not present

## 2019-06-25 DIAGNOSIS — Z1211 Encounter for screening for malignant neoplasm of colon: Secondary | ICD-10-CM | POA: Diagnosis not present

## 2019-06-25 DIAGNOSIS — Z79899 Other long term (current) drug therapy: Secondary | ICD-10-CM | POA: Diagnosis not present

## 2019-06-25 DIAGNOSIS — E7800 Pure hypercholesterolemia, unspecified: Secondary | ICD-10-CM | POA: Diagnosis not present

## 2019-06-25 DIAGNOSIS — R7309 Other abnormal glucose: Secondary | ICD-10-CM | POA: Diagnosis not present

## 2019-07-02 DIAGNOSIS — Z1211 Encounter for screening for malignant neoplasm of colon: Secondary | ICD-10-CM | POA: Diagnosis not present

## 2019-08-12 DIAGNOSIS — M1612 Unilateral primary osteoarthritis, left hip: Secondary | ICD-10-CM | POA: Diagnosis not present

## 2019-08-12 DIAGNOSIS — M25552 Pain in left hip: Secondary | ICD-10-CM | POA: Diagnosis not present

## 2019-08-18 DIAGNOSIS — H903 Sensorineural hearing loss, bilateral: Secondary | ICD-10-CM | POA: Diagnosis not present

## 2019-09-28 DIAGNOSIS — Z1211 Encounter for screening for malignant neoplasm of colon: Secondary | ICD-10-CM | POA: Diagnosis not present

## 2019-09-28 DIAGNOSIS — F3341 Major depressive disorder, recurrent, in partial remission: Secondary | ICD-10-CM | POA: Diagnosis not present

## 2019-09-28 DIAGNOSIS — Z0000 Encounter for general adult medical examination without abnormal findings: Secondary | ICD-10-CM | POA: Diagnosis not present

## 2019-09-28 DIAGNOSIS — J431 Panlobular emphysema: Secondary | ICD-10-CM | POA: Diagnosis not present

## 2019-09-28 DIAGNOSIS — Z79899 Other long term (current) drug therapy: Secondary | ICD-10-CM | POA: Diagnosis not present

## 2019-09-28 DIAGNOSIS — R7309 Other abnormal glucose: Secondary | ICD-10-CM | POA: Diagnosis not present

## 2019-09-28 DIAGNOSIS — E7800 Pure hypercholesterolemia, unspecified: Secondary | ICD-10-CM | POA: Diagnosis not present

## 2019-09-28 DIAGNOSIS — G894 Chronic pain syndrome: Secondary | ICD-10-CM | POA: Diagnosis not present

## 2019-10-14 DIAGNOSIS — Z1211 Encounter for screening for malignant neoplasm of colon: Secondary | ICD-10-CM | POA: Diagnosis not present

## 2019-11-27 IMAGING — MR MR HEAD W/O CM
11 series · 47 of 48 positions shown · non-contrast
Comparison: None.

CLINICAL DATA: Worsening dizziness.  History of hyperlipidemia.

EXAM:
MRI HEAD WITHOUT CONTRAST
TECHNIQUE: Multiplanar, multiecho pulse sequences of the brain and surrounding
structures were obtained without intravenous contrast.

[Series 2: ax dwi_tracew · axial · 3.0mm · 0.83mm/px · z∈[-77,+85]mm · 4 of 55 slices shown]
[im 1/55]
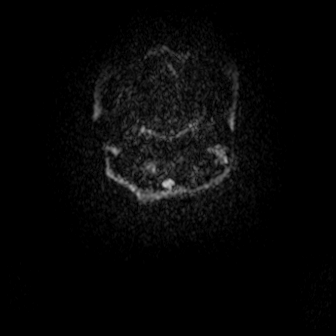
[im 19/55]
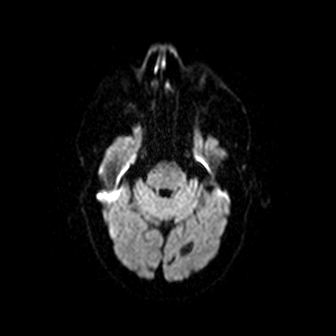
[im 37/55]
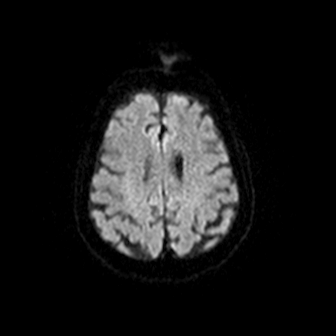
[im 55/55]
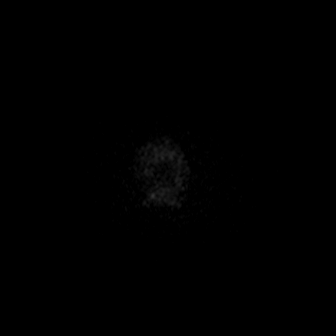

[Series 3: ax dwi_adc · axial · 3.0mm · 0.83mm/px · z∈[-77,+85]mm · 4 of 55 slices shown]
[im 1/55]
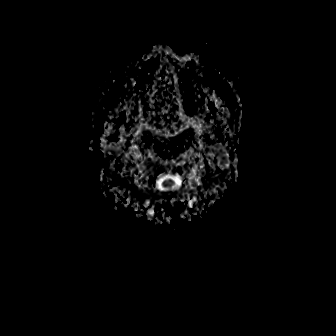
[im 19/55]
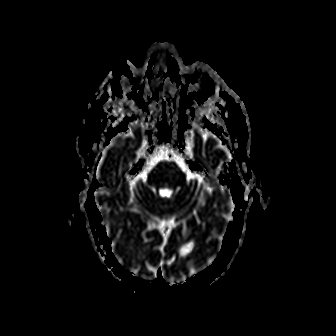
[im 37/55]
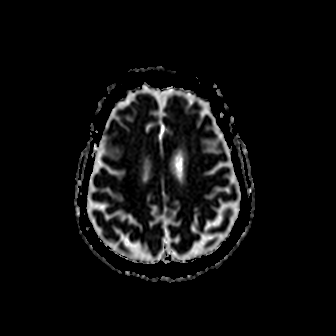
[im 55/55]
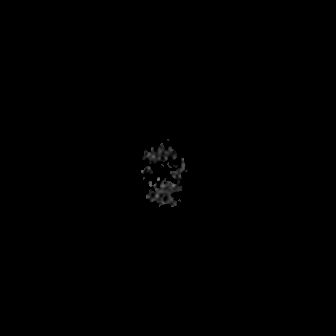

[Series 4: cor dwi_tracew · coronal · 5.0mm · 0.60mm/px · 3 of 38 slices shown]
[im 1/38]
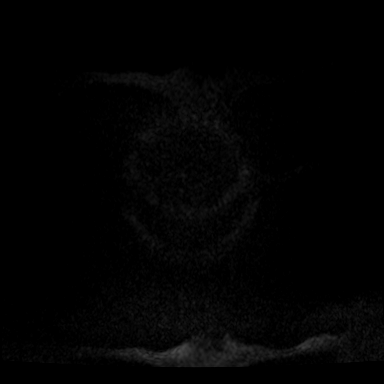
[im 19/38]
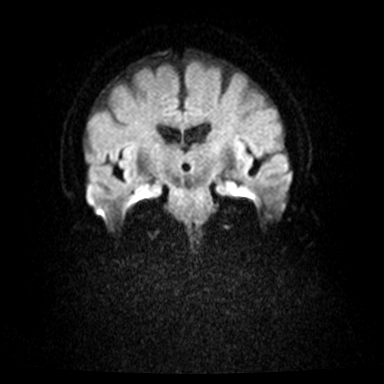
[im 38/38]
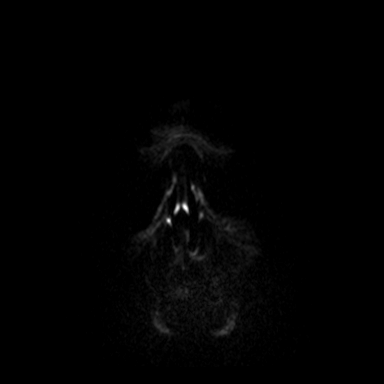

[Series 5: cor dwi_adc · coronal · 5.0mm · 0.60mm/px · 3 of 38 slices shown]
[im 1/38]
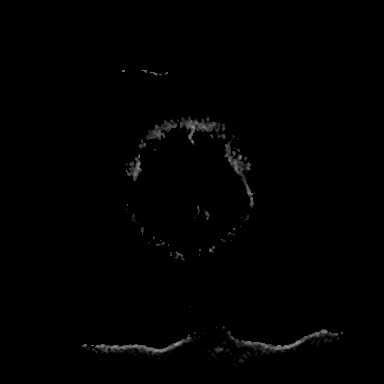
[im 19/38]
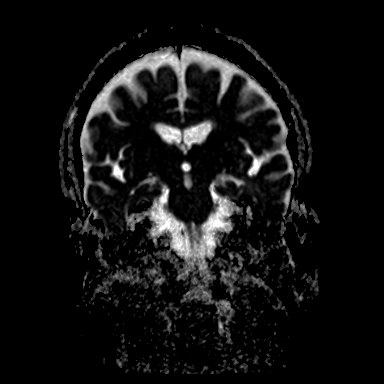
[im 38/38]
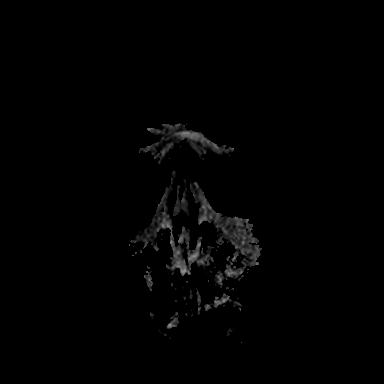

[Series 6: T1 · sagittal · 5.0mm · 0.62mm/px · 2 of 25 slices shown (1 of 2)]
[im 1/25]
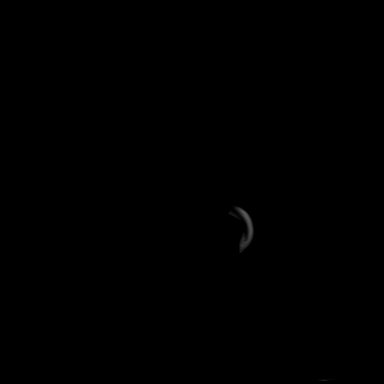
[im 25/25]
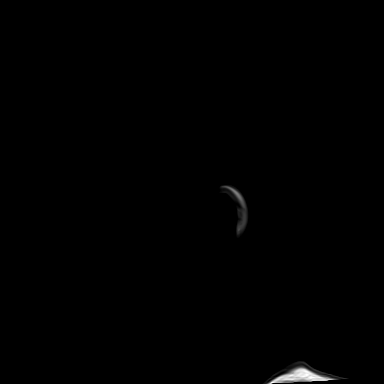

[Series 7: T2 · axial · 5.0mm · 0.55mm/px · z∈[-73,+83]mm · 2 of 27 slices shown (1 of 2)]
[im 1/27]
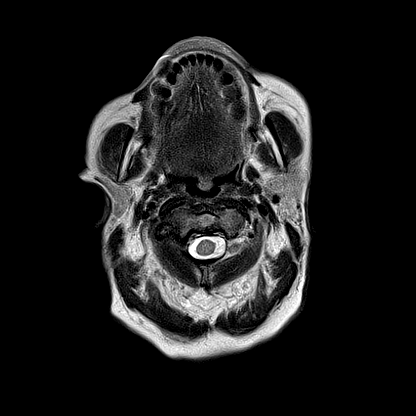
[im 27/27]
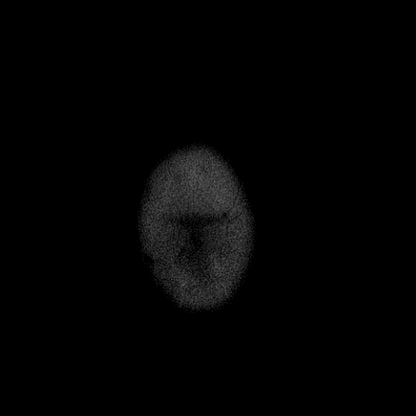

[Series 9: pha_images · axial · 3.0mm · 0.90mm/px · z∈[-85,+89]mm · 5 of 59 slices shown]
[im 1/59]
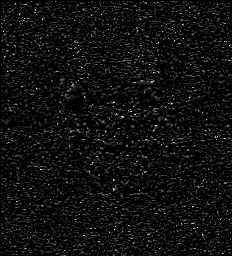
[im 15/59]
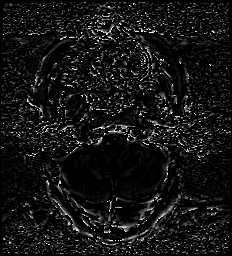
[im 30/59]
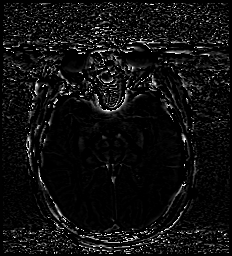
[im 44/59]
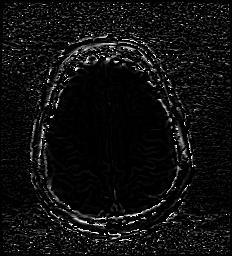
[im 59/59]
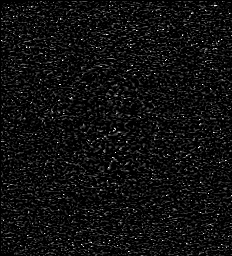

[Series 10: swi_images · axial · 3.0mm · 0.90mm/px · z∈[-85,+92]mm · 5 of 60 slices shown]
[im 1/60]
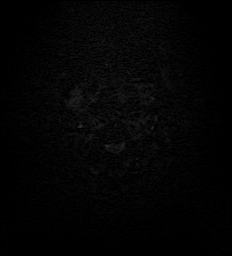
[im 15/60]
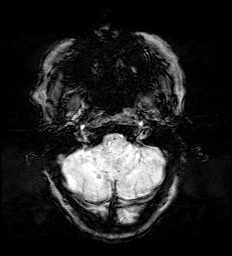
[im 30/60]
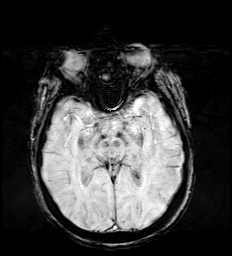
[im 45/60]
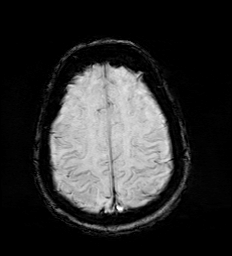
[im 60/60]
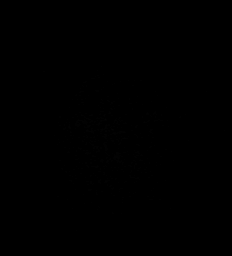

[Series 12: FLAIR · axial · 3.0mm · 0.55mm/px · z∈[-78,+84]mm · 4 of 55 slices shown]
[im 1/55]
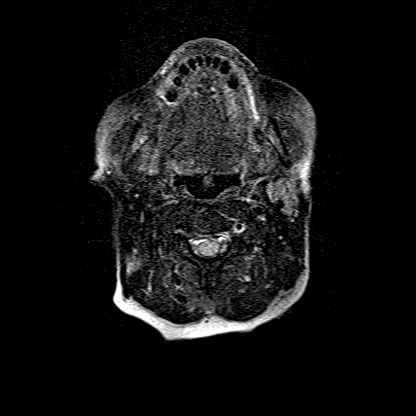
[im 19/55]
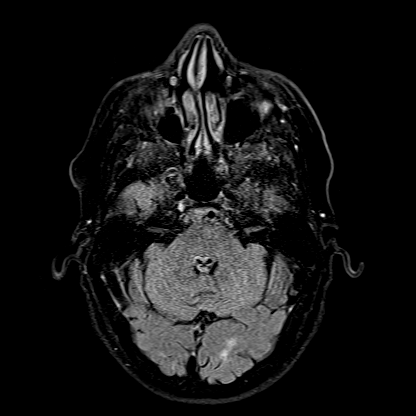
[im 37/55]
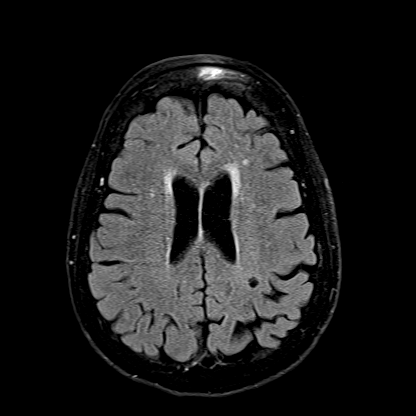
[im 55/55]
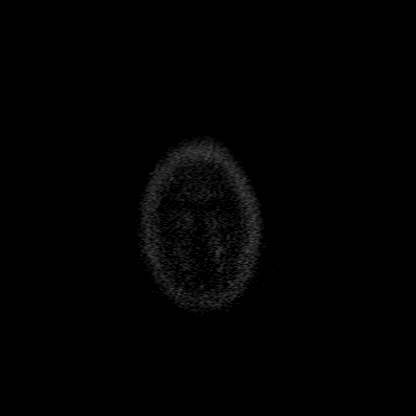

[Series 13: T1 · axial · 1.0mm · 0.98mm/px · z∈[-82,+92]mm · 13 of 176 slices shown (2 of 2)]
[im 1/176]
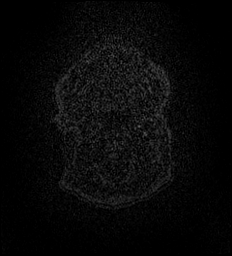
[im 14/176]
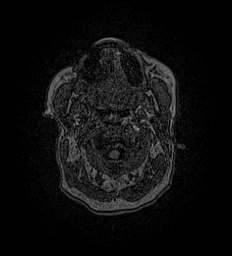
[im 27/176]
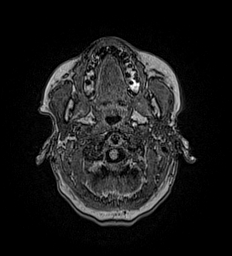
[im 41/176]
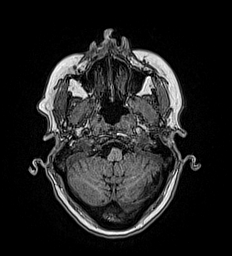
[im 54/176]
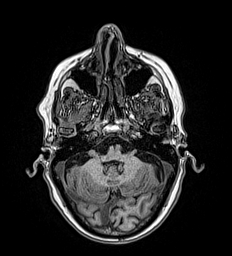
[im 68/176]
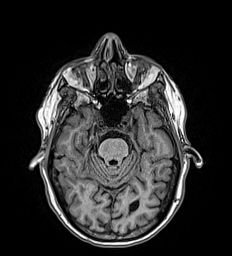
[im 81/176]
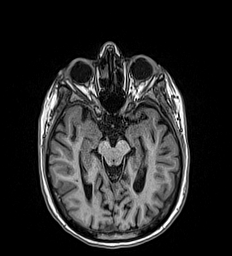
[im 95/176]
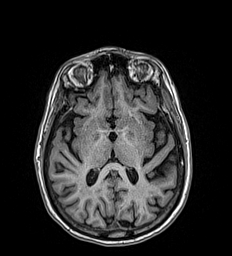
[im 108/176]
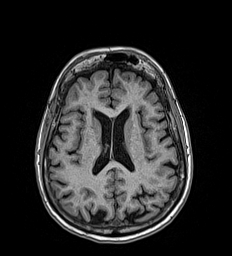
[im 122/176]
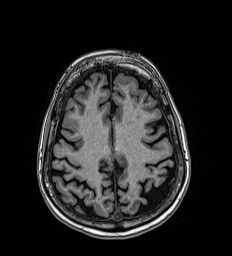
[im 135/176]
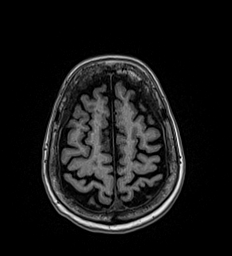
[im 149/176]
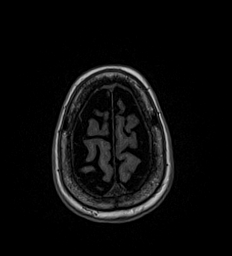
[im 176/176]
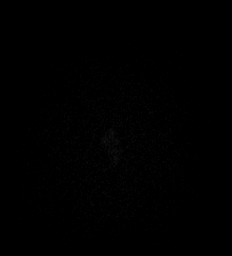

[Series 14: T2 · coronal · 5.0mm · 0.57mm/px · 2 of 29 slices shown (2 of 2)]
[im 1/29]
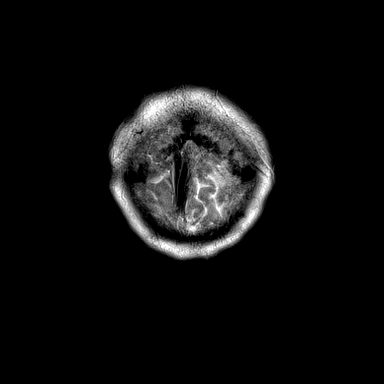
[im 29/29]
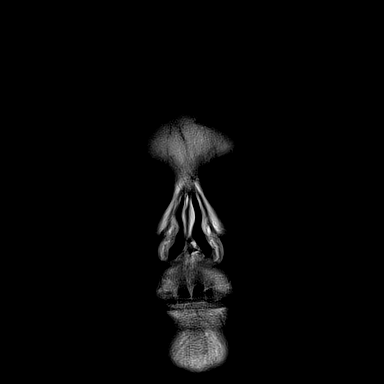

[47 of 48 positions shown; findings below may reference images not displayed]

FINDINGS: INTRACRANIAL CONTENTS: No reduced diffusion to suggest acute
ischemia or hyperacute demyelination. No susceptibility artifact to
suggest hemorrhage. No parenchymal brain volume loss for age. No
hydrocephalus. A few scattered subcentimeter supratentorial white
matter FLAIR T2 hyperintensities seen with chronic small vessel
ischemic changes, less than expected for age. No suspicious
parenchymal signal, masses, mass effect. No abnormal extra-axial
fluid collections. No extra-axial masses.

VASCULAR: Normal major intracranial vascular flow voids present at
skull base.

SKULL AND UPPER CERVICAL SPINE: No abnormal sellar expansion. No
suspicious calvarial bone marrow signal. Cervical spondylosis,
incompletely evaluated. Craniocervical junction maintained.

SINUSES/ORBITS: Mild paranasal sinus mucosal thickening. LEFT
maxillary mucosal retention cyst. Trace RIGHT mastoid effusion.The
included ocular globes and orbital contents are non-suspicious.
Status post bilateral ocular lens implants.

OTHER: None.
IMPRESSION: Normal noncontrast MRI head for age.

## 2020-09-22 NOTE — Progress Notes (Addendum)
[facility] Note  09/25/2020     CHIEF COMPLAINT Patient presents for Retina Follow Up   HISTORY OF PRESENT ILLNESS: Brenda Wiley is a 79 y.o. female who presents to the clinic today for:   HPI    Retina Follow Up    Patient presents with  Other.  In left eye.  This started 2 years ago.  I, the attending physician,  performed the HPI with the patient and updated documentation appropriately.          Comments    Patient here for 2 years retinal follow up. Patient states vision not good can't see. OS really bad. OD ok. Uses readers. No eye pain.        Last edited by Bernarda Caffey, MD on 09/25/2020 10:15 AM. (History)    pt states she had cataract sx in 2019 with Dr. Herbert Deaner, she states she can see well out of her right eye, but cannot see well out of her left eye, she states she only wears glasses for close up, she states she recently saw Dr. Ellin Mayhew who told her she needs a new glasses rx  Referring physician: Dwain Sarna, [place],  [place] [id number]  HISTORICAL INFORMATION:   Selected notes from the MEDICAL RECORD NUMBER Referred by Dr. Monna Fam for concern of macular cyst / surgical clearance LEE: 11.11.19 (K. Hecker) [BCVA: OD: 20/25 OS: 20/30-] Ocular Hx-HTN ret, drusen, PVD, chemosis, optic nerve atrophy, arteriolar sclerosis PMH-anxiety, current smoker    CURRENT MEDICATIONS: Current Outpatient Medications (Ophthalmic Drugs)  Medication Sig  . ketorolac (ACULAR) 0.5 % ophthalmic solution INSTILL 1 DROP IN LEFT EYE FOUR TIMES A DAY START 1 WEEK PRIOR TO SURGERY  . ofloxacin (OCUFLOX) 0.3 % ophthalmic solution INSTILL 1 DROP IN BOTH EYES FOUR TIMES A DAY START 72 HOURS PRIOR TO SURGERY  . prednisoLONE acetate (PRED FORTE) 1 % ophthalmic suspension INSTILL 1 DROP IN LEFT EYE FOUR TIMES A DAY START AFTER SURGERY   No current facility-administered medications for this visit. (Ophthalmic Drugs)   Current Outpatient  Medications (Other)  Medication Sig  . ALPRAZolam (XANAX) 0.25 MG tablet Take by mouth.  . ALPRAZolam (XANAX) 0.25 MG tablet TAKE 1 TABLET BY MOUTH NIGHTLY AS NEEDED FOR SLEEP  . FLUoxetine (PROZAC) 10 MG capsule Take 10 mg by mouth daily.   . Oxycodone HCl 10 MG TABS Take 10 mg by mouth 2 (two) times daily as needed. for pain  . Phentermine HCl 37.5 MG TBDP Take 18.75 mg by mouth daily.   No current facility-administered medications for this visit. (Other)      REVIEW OF SYSTEMS: ROS    Positive for: Eyes, Psychiatric   Negative for: Constitutional, Gastrointestinal, Neurological, Skin, Genitourinary, Musculoskeletal, HENT, Endocrine, Cardiovascular, Respiratory, Allergic/Imm, Heme/Lymph   Last edited by Theodore Demark, COA on 09/25/2020  9:30 AM. (History)       ALLERGIES Allergies  Allergen Reactions  . Asa [Aspirin]   . Ceclor [Cefaclor]   . Codeine Itching    Will take if I have to    PAST MEDICAL HISTORY Past Medical History:  Diagnosis Date  . Anal sphincter tear (healed) (old)-anterior 04/13/2014  . Basal cell carcinoma   . Chronic headaches    resolved  . Depression    loss of spouse  . Diverticulosis   . Dizzy spells 02/23/2015   X 1year  . HLD (hyperlipidemia)    Past Surgical History:  Procedure Laterality Date  . CESAREAN SECTION     x 2  . COLONOSCOPY    . PARTIAL HYSTERECTOMY    . TONSILLECTOMY      FAMILY HISTORY Family History  Problem Relation Age of Onset  . Heart disease Father   . Heart disease Mother   . Cataracts Mother   . Diabetes Other        maternal side  . Heart disease Brother   . Cancer Maternal Aunt        type unknown  . Diverticulitis Other        nephew x 2  . Cataracts Sister   . Amblyopia Neg Hx   . Blindness Neg Hx   . Glaucoma Neg Hx   . Macular degeneration Neg Hx   . Retinal detachment Neg Hx   . Strabismus Neg Hx   . Retinitis pigmentosa Neg Hx     SOCIAL HISTORY Social History   Tobacco Use  .  Smoking status: Former Smoker    Types: Cigarettes    Quit date: 10/21/2012    Years since quitting: 7.9  . Smokeless tobacco: Never Used  Substance Use Topics  . Alcohol use: No    Comment: occasional-beer  . Drug use: No         OPHTHALMIC EXAM:  Base Eye Exam    Visual Acuity (Snellen - Linear)      Right Left   Dist [place] 20/40 -2 20/200 +1   Dist ph [place] 20/25 20/50       Tonometry (Tonopen, 9:27 AM)      Right Left   Pressure 12 13       Pupils      Dark Light Shape React APD   Right 4 3 Round Brisk None   Left 4 3 Round Brisk None       Visual Fields (Counting fingers)      Left Right    Full Full       Extraocular Movement      Right Left    Full, Ortho Full, Ortho       Neuro/Psych    Oriented x3: Yes   Mood/Affect: Normal       Dilation    Both eyes: 1.0% Mydriacyl, 2.5% Phenylephrine @ 9:27 AM        Slit Lamp and Fundus Exam    Slit Lamp Exam      Right Left   Lids/Lashes Dermatochalasis - upper lid, mild Meibomian gland dysfunction Dermatochalasis - upper lid, mild Meibomian gland dysfunction   Conjunctiva/Sclera White and quiet White and quiet   Cornea Arcus, trace Punctate epithelial erosions, trace Debris in tear film Arcus, trace Punctate epithelial erosions   Anterior Chamber Deep and clear Deep and clear   Iris Round and dilated Round and dilated   Lens PC IOL in good position PC IOL in good position   Vitreous mild Vitreous syneresis Vitreous syneresis, Posterior vitreous detachment, Weiss ring       Fundus Exam      Right Left   Disc Mild Pallor, Sharp rim Mild Pallor, Sharp rim   C/D Ratio 0.3 0.4   Macula Flat, good foveal reflex, fine drusen, Retinal pigment epithelial mottling, no edema Flat, Blunted foveal reflex, mild Epiretinal membrane with trace cystic changes, Retinal pigment epithelial mottling, focal blot heme ST mac, No edema   Vessels attenuated, mild tortuousity, mild sheathing of ST arteriole, mild Copper wiring mild  Vascular attenuation, Copper  wiring, AV crossing changes   Periphery Attached, 2 pigmented CR scars at 0300 midzone, pigment clumping at 1030  Attached, horseshoe tear at 0230 with pigment surrounding, no SRF - good laser surrounding; focal punctate IRH temporal periphery        Refraction    Wearing Rx      Sphere Cylinder Axis Add   Right +1.75 +3.50 165 +2.00   Left +2.00 +2.00 012 +2.00          IMAGING AND PROCEDURES  Imaging and Procedures for _0 @  OCT, Retina - OU - Both Eyes       Right Eye Quality was good. Central Foveal Thickness: 262. Progression has worsened. Findings include normal foveal contour, no SRF, no IRF, retinal drusen  (Mild progression of retinal drusen).   Left Eye Quality was good. Central Foveal Thickness: 325. Progression has been stable. Findings include abnormal foveal contour, no SRF, intraretinal fluid, epiretinal membrane, retinal drusen , macular pucker (Persistent central cystic changes  - stable from prior).   Notes *Images captured and stored on drive  Diagnosis / Impression:  Non-exu ARMD OU OD: Mild progression of retinal drusen OS: Persistent central cystic changes  - stable from prior   Clinical management:  See below  Abbreviations: NFP - Normal foveal profile. CME - cystoid macular edema. PED - pigment epithelial detachment. IRF - intraretinal fluid. SRF - subretinal fluid. EZ - ellipsoid zone. ERM - epiretinal membrane. ORA - outer retinal atrophy. ORT - outer retinal tubulation. SRHM - subretinal hyper-reflective material                 ASSESSMENT/PLAN:    ICD-10-CM   1. Retinal tear of left eye  H33.312   2. Epiretinal membrane (ERM) of left eye  H35.372   3. Retinal edema  H35.81 OCT, Retina - OU - Both Eyes  4. Early dry stage nonexudative age-related macular degeneration of both eyes  H35.3131   5. Hypertensive retinopathy of both eyes  H35.033   6. Pseudophakia, both eyes  Z96.1     1. Retinal tear,  OS    - tear located at 0230 -- appears chronic w/ pigment surrounding and no SRF  - s/p laser retinopexy OS (11.20.19) -- good laser changes surrounding  - stable  2,3. Epiretinal membrane, OS -- stable  - ERM with retinal edema and central cystic changes  - asymptomatic, no metamorphopsia  - BCVA 20/50 OS -- PCIOL set for near vision  - suspect etiology of ERM related to old HST described above  - no indication for surgery at this time  - monitor for now  - f/u 4-6 mos, DFE, OCT  4. Age related macular degeneration, non-exudative, both eyes  - early stage OU  - OD with mild progression of drusen from prior on OCT  - The incidence, anatomy, and pathology of dry AMD, risk of progression, and the AREDS and AREDS 2 study including smoking risks discussed with patient.  - Recommend amsler grid monitoring  5. Hypertensive retinopathy OU  - pt with hypertensive like changes on exam -- vascular attenuation and scattered IRH/DBH OU -- but does not carry diagnosis of HTN or take any BP medications  - discussed importance of tight BP control  - monitor  6. Pseudophakia OU  - s/p CE/IOL OU (Dr. Herbert Deaner, 2019)  - IOLs in good position, doing well  - monitor   Ophthalmic Meds Ordered this visit:  No orders of the defined types were  placed in this encounter.      Return for f/u 4-6 months, ERM OS, DFE, OCT.  There are no Patient Instructions on file for this visit.  This document serves as a record of services personally performed by Gardiner Sleeper, MD, PhD. It was created on their behalf by Leeann Must, [place], an ophthalmic technician. The creation of this record is the provider's dictation and/or activities during the visit.    Electronically signed by: Leeann Must, [place] 12.03.2021 12:36 PM   This document serves as a record of services personally performed by Gardiner Sleeper, MD, PhD. It was created on their behalf by San Jetty. Owens Shark, OA an ophthalmic technician. The creation of  this record is the provider's dictation and/or activities during the visit.    Electronically signed by: San Jetty. Marguerita Merles 12.06.2021 12:36 PM  Gardiner Sleeper, M.D., Ph.D. Diseases & Surgery of the Retina and [place] 09/25/2020    I have reviewed the above documentation for accuracy and completeness, and I agree with the above. Gardiner Sleeper, M.D., Ph.D. 09/25/20 12:36 PM   Abbreviations: M myopia (nearsighted); A astigmatism; H hyperopia (farsighted); P presbyopia; Mrx spectacle prescription;  CTL contact lenses; OD right eye; OS left eye; OU both eyes  XT exotropia; ET esotropia; PEK punctate epithelial keratitis; PEE punctate epithelial erosions; DES dry eye syndrome; MGD meibomian gland dysfunction; ATs artificial tears; PFAT's preservative free artificial tears; [place] nuclear sclerotic cataract; PSC posterior subcapsular cataract; ERM epi-retinal membrane; PVD posterior vitreous detachment; RD retinal detachment; DM diabetes mellitus; DR diabetic retinopathy; NPDR non-proliferative diabetic retinopathy; PDR proliferative diabetic retinopathy; CSME clinically significant macular edema; DME diabetic macular edema; dbh dot blot hemorrhages; CWS cotton wool spot; POAG primary open angle glaucoma; C/D cup-to-disc ratio; HVF humphrey visual field; GVF goldmann visual field; OCT optical coherence tomography; IOP intraocular pressure; BRVO Branch retinal vein occlusion; CRVO central retinal vein occlusion; CRAO central retinal artery occlusion; BRAO branch retinal artery occlusion; RT retinal tear; SB scleral buckle; PPV pars plana vitrectomy; VH Vitreous hemorrhage; PRP panretinal laser photocoagulation; IVK intravitreal kenalog; VMT vitreomacular traction; MH Macular hole;  NVD neovascularization of the disc; NVE neovascularization elsewhere; AREDS age related eye disease study; ARMD age related macular degeneration; POAG primary open angle glaucoma; EBMD  epithelial/anterior basement membrane dystrophy; ACIOL anterior chamber intraocular lens; IOL intraocular lens; PCIOL posterior chamber intraocular lens; Phaco/IOL phacoemulsification with intraocular lens placement; [place] photorefractive keratectomy; LASIK laser assisted in situ keratomileusis; HTN hypertension; DM diabetes mellitus; COPD chronic obstructive pulmonary disease

## 2020-09-25 ENCOUNTER — Other Ambulatory Visit: Payer: Self-pay

## 2020-09-25 ENCOUNTER — Ambulatory Visit (INDEPENDENT_AMBULATORY_CARE_PROVIDER_SITE_OTHER): Payer: Medicare Other | Admitting: Ophthalmology

## 2020-09-25 ENCOUNTER — Encounter (INDEPENDENT_AMBULATORY_CARE_PROVIDER_SITE_OTHER): Payer: Self-pay | Admitting: Ophthalmology

## 2020-09-25 DIAGNOSIS — H35372 Puckering of macula, left eye: Secondary | ICD-10-CM | POA: Diagnosis not present

## 2020-09-25 DIAGNOSIS — H353131 Nonexudative age-related macular degeneration, bilateral, early dry stage: Secondary | ICD-10-CM | POA: Diagnosis not present

## 2020-09-25 DIAGNOSIS — H33312 Horseshoe tear of retina without detachment, left eye: Secondary | ICD-10-CM

## 2020-09-25 DIAGNOSIS — H3581 Retinal edema: Secondary | ICD-10-CM | POA: Diagnosis not present

## 2020-09-25 DIAGNOSIS — H35033 Hypertensive retinopathy, bilateral: Secondary | ICD-10-CM

## 2020-09-25 DIAGNOSIS — Z961 Presence of intraocular lens: Secondary | ICD-10-CM

## 2020-10-30 DIAGNOSIS — H903 Sensorineural hearing loss, bilateral: Secondary | ICD-10-CM | POA: Diagnosis not present

## 2020-11-03 DIAGNOSIS — R7309 Other abnormal glucose: Secondary | ICD-10-CM | POA: Diagnosis not present

## 2020-11-03 DIAGNOSIS — G894 Chronic pain syndrome: Secondary | ICD-10-CM | POA: Diagnosis not present

## 2020-11-03 DIAGNOSIS — E7800 Pure hypercholesterolemia, unspecified: Secondary | ICD-10-CM | POA: Diagnosis not present

## 2020-11-03 DIAGNOSIS — H903 Sensorineural hearing loss, bilateral: Secondary | ICD-10-CM | POA: Diagnosis not present

## 2020-11-03 DIAGNOSIS — J431 Panlobular emphysema: Secondary | ICD-10-CM | POA: Diagnosis not present

## 2020-11-03 DIAGNOSIS — Z79899 Other long term (current) drug therapy: Secondary | ICD-10-CM | POA: Diagnosis not present

## 2021-01-15 DIAGNOSIS — Z79899 Other long term (current) drug therapy: Secondary | ICD-10-CM | POA: Diagnosis not present

## 2021-01-15 DIAGNOSIS — G894 Chronic pain syndrome: Secondary | ICD-10-CM | POA: Diagnosis not present

## 2021-01-15 DIAGNOSIS — E7800 Pure hypercholesterolemia, unspecified: Secondary | ICD-10-CM | POA: Diagnosis not present

## 2021-01-15 DIAGNOSIS — R7309 Other abnormal glucose: Secondary | ICD-10-CM | POA: Diagnosis not present

## 2021-01-22 DIAGNOSIS — Z0000 Encounter for general adult medical examination without abnormal findings: Secondary | ICD-10-CM | POA: Diagnosis not present

## 2021-01-22 DIAGNOSIS — I10 Essential (primary) hypertension: Secondary | ICD-10-CM | POA: Diagnosis not present

## 2021-01-22 DIAGNOSIS — R921 Mammographic calcification found on diagnostic imaging of breast: Secondary | ICD-10-CM | POA: Diagnosis not present

## 2021-01-22 DIAGNOSIS — R7309 Other abnormal glucose: Secondary | ICD-10-CM | POA: Diagnosis not present

## 2021-01-22 DIAGNOSIS — F3341 Major depressive disorder, recurrent, in partial remission: Secondary | ICD-10-CM | POA: Diagnosis not present

## 2021-01-22 DIAGNOSIS — Z79899 Other long term (current) drug therapy: Secondary | ICD-10-CM | POA: Diagnosis not present

## 2021-01-22 DIAGNOSIS — G894 Chronic pain syndrome: Secondary | ICD-10-CM | POA: Diagnosis not present

## 2021-01-22 DIAGNOSIS — E7800 Pure hypercholesterolemia, unspecified: Secondary | ICD-10-CM | POA: Diagnosis not present

## 2021-01-22 DIAGNOSIS — J431 Panlobular emphysema: Secondary | ICD-10-CM | POA: Diagnosis not present

## 2021-03-07 DIAGNOSIS — H43813 Vitreous degeneration, bilateral: Secondary | ICD-10-CM | POA: Diagnosis not present

## 2021-03-07 DIAGNOSIS — H353132 Nonexudative age-related macular degeneration, bilateral, intermediate dry stage: Secondary | ICD-10-CM | POA: Diagnosis not present

## 2021-03-07 DIAGNOSIS — H524 Presbyopia: Secondary | ICD-10-CM | POA: Diagnosis not present

## 2021-03-07 DIAGNOSIS — H35372 Puckering of macula, left eye: Secondary | ICD-10-CM | POA: Diagnosis not present

## 2021-03-07 DIAGNOSIS — Z961 Presence of intraocular lens: Secondary | ICD-10-CM | POA: Diagnosis not present

## 2021-05-02 DIAGNOSIS — R7309 Other abnormal glucose: Secondary | ICD-10-CM | POA: Diagnosis not present

## 2021-05-02 DIAGNOSIS — Z79899 Other long term (current) drug therapy: Secondary | ICD-10-CM | POA: Diagnosis not present

## 2021-05-16 DIAGNOSIS — G894 Chronic pain syndrome: Secondary | ICD-10-CM | POA: Diagnosis not present

## 2021-05-16 DIAGNOSIS — R7309 Other abnormal glucose: Secondary | ICD-10-CM | POA: Diagnosis not present

## 2021-05-16 DIAGNOSIS — E7800 Pure hypercholesterolemia, unspecified: Secondary | ICD-10-CM | POA: Diagnosis not present

## 2021-05-16 DIAGNOSIS — Z79899 Other long term (current) drug therapy: Secondary | ICD-10-CM | POA: Diagnosis not present

## 2021-05-16 DIAGNOSIS — J431 Panlobular emphysema: Secondary | ICD-10-CM | POA: Diagnosis not present

## 2021-05-16 DIAGNOSIS — F3341 Major depressive disorder, recurrent, in partial remission: Secondary | ICD-10-CM | POA: Diagnosis not present

## 2021-05-16 DIAGNOSIS — R0602 Shortness of breath: Secondary | ICD-10-CM | POA: Diagnosis not present

## 2021-06-18 DIAGNOSIS — R944 Abnormal results of kidney function studies: Secondary | ICD-10-CM | POA: Diagnosis not present

## 2021-06-28 DIAGNOSIS — R0602 Shortness of breath: Secondary | ICD-10-CM | POA: Diagnosis not present

## 2021-08-20 DIAGNOSIS — Z79899 Other long term (current) drug therapy: Secondary | ICD-10-CM | POA: Diagnosis not present

## 2021-08-20 DIAGNOSIS — R7309 Other abnormal glucose: Secondary | ICD-10-CM | POA: Diagnosis not present

## 2021-08-20 DIAGNOSIS — E7800 Pure hypercholesterolemia, unspecified: Secondary | ICD-10-CM | POA: Diagnosis not present

## 2021-08-20 DIAGNOSIS — G894 Chronic pain syndrome: Secondary | ICD-10-CM | POA: Diagnosis not present

## 2021-08-20 DIAGNOSIS — F3341 Major depressive disorder, recurrent, in partial remission: Secondary | ICD-10-CM | POA: Diagnosis not present

## 2021-08-20 DIAGNOSIS — J431 Panlobular emphysema: Secondary | ICD-10-CM | POA: Diagnosis not present

## 2021-09-13 DIAGNOSIS — Z20822 Contact with and (suspected) exposure to covid-19: Secondary | ICD-10-CM | POA: Diagnosis not present

## 2021-09-13 DIAGNOSIS — Z79899 Other long term (current) drug therapy: Secondary | ICD-10-CM | POA: Diagnosis not present

## 2021-09-13 DIAGNOSIS — R0602 Shortness of breath: Secondary | ICD-10-CM | POA: Diagnosis not present

## 2021-09-13 DIAGNOSIS — Z87891 Personal history of nicotine dependence: Secondary | ICD-10-CM | POA: Diagnosis not present

## 2021-09-13 DIAGNOSIS — F32A Depression, unspecified: Secondary | ICD-10-CM | POA: Diagnosis not present

## 2021-09-13 DIAGNOSIS — N178 Other acute kidney failure: Secondary | ICD-10-CM | POA: Diagnosis not present

## 2021-09-13 DIAGNOSIS — R0609 Other forms of dyspnea: Secondary | ICD-10-CM | POA: Diagnosis not present

## 2021-09-13 DIAGNOSIS — Z885 Allergy status to narcotic agent status: Secondary | ICD-10-CM | POA: Diagnosis not present

## 2021-09-13 DIAGNOSIS — Z886 Allergy status to analgesic agent status: Secondary | ICD-10-CM | POA: Diagnosis not present

## 2021-09-18 DIAGNOSIS — Z09 Encounter for follow-up examination after completed treatment for conditions other than malignant neoplasm: Secondary | ICD-10-CM | POA: Diagnosis not present

## 2021-09-18 DIAGNOSIS — R0602 Shortness of breath: Secondary | ICD-10-CM | POA: Diagnosis not present

## 2021-09-18 DIAGNOSIS — N179 Acute kidney failure, unspecified: Secondary | ICD-10-CM | POA: Diagnosis not present

## 2021-10-02 ENCOUNTER — Other Ambulatory Visit
Admission: RE | Admit: 2021-10-02 | Discharge: 2021-10-02 | Disposition: A | Discharge status: Home / Self Care | Payer: PPO | Source: Ambulatory Visit | Attending: Physician Assistant | Admitting: Physician Assistant

## 2021-10-02 DIAGNOSIS — R0602 Shortness of breath: Secondary | ICD-10-CM | POA: Diagnosis not present

## 2021-10-02 DIAGNOSIS — F411 Generalized anxiety disorder: Secondary | ICD-10-CM | POA: Diagnosis not present

## 2021-10-02 DIAGNOSIS — R531 Weakness: Secondary | ICD-10-CM | POA: Diagnosis not present

## 2021-10-02 LAB — D-DIMER, QUANTITATIVE: D-Dimer, Quant: 2.66 ug/mL-FEU — ABNORMAL HIGH (ref 0.00–0.50)

## 2021-10-03 ENCOUNTER — Other Ambulatory Visit (HOSPITAL_COMMUNITY): Payer: Self-pay | Admitting: Physician Assistant

## 2021-10-03 ENCOUNTER — Ambulatory Visit
Admission: RE | Admit: 2021-10-03 | Discharge: 2021-10-03 | Disposition: A | Discharge status: Home / Self Care | Payer: PPO | Source: Ambulatory Visit | Attending: Physician Assistant | Admitting: Physician Assistant

## 2021-10-03 ENCOUNTER — Other Ambulatory Visit: Payer: Self-pay

## 2021-10-03 ENCOUNTER — Other Ambulatory Visit: Payer: Self-pay | Admitting: Physician Assistant

## 2021-10-03 DIAGNOSIS — R7989 Other specified abnormal findings of blood chemistry: Secondary | ICD-10-CM | POA: Insufficient documentation

## 2021-10-03 DIAGNOSIS — R0602 Shortness of breath: Secondary | ICD-10-CM

## 2021-10-03 MED ORDER — TECHNETIUM TO 99M ALBUMIN AGGREGATED
4.0000 | Freq: Once | INTRAVENOUS | Status: AC | PRN
  Administered 2021-10-03: 14:00:00 4.5 via INTRAVENOUS

## 2021-10-16 DIAGNOSIS — F3341 Major depressive disorder, recurrent, in partial remission: Secondary | ICD-10-CM | POA: Diagnosis not present

## 2021-10-16 DIAGNOSIS — J431 Panlobular emphysema: Secondary | ICD-10-CM | POA: Diagnosis not present

## 2021-10-16 DIAGNOSIS — E7800 Pure hypercholesterolemia, unspecified: Secondary | ICD-10-CM | POA: Diagnosis not present

## 2021-10-16 DIAGNOSIS — R7309 Other abnormal glucose: Secondary | ICD-10-CM | POA: Diagnosis not present

## 2021-10-16 DIAGNOSIS — G894 Chronic pain syndrome: Secondary | ICD-10-CM | POA: Diagnosis not present

## 2021-11-28 DIAGNOSIS — G894 Chronic pain syndrome: Secondary | ICD-10-CM | POA: Diagnosis not present

## 2021-11-28 DIAGNOSIS — F3341 Major depressive disorder, recurrent, in partial remission: Secondary | ICD-10-CM | POA: Diagnosis not present

## 2021-11-28 DIAGNOSIS — Z0000 Encounter for general adult medical examination without abnormal findings: Secondary | ICD-10-CM | POA: Diagnosis not present

## 2021-11-28 DIAGNOSIS — Z79899 Other long term (current) drug therapy: Secondary | ICD-10-CM | POA: Diagnosis not present

## 2021-11-28 DIAGNOSIS — E7800 Pure hypercholesterolemia, unspecified: Secondary | ICD-10-CM | POA: Diagnosis not present

## 2021-11-28 DIAGNOSIS — N1832 Chronic kidney disease, stage 3b: Secondary | ICD-10-CM | POA: Diagnosis not present

## 2021-11-28 DIAGNOSIS — J431 Panlobular emphysema: Secondary | ICD-10-CM | POA: Diagnosis not present

## 2021-11-28 DIAGNOSIS — R7309 Other abnormal glucose: Secondary | ICD-10-CM | POA: Diagnosis not present

## 2021-12-06 DIAGNOSIS — R944 Abnormal results of kidney function studies: Secondary | ICD-10-CM | POA: Diagnosis not present

## 2022-03-01 DIAGNOSIS — G894 Chronic pain syndrome: Secondary | ICD-10-CM | POA: Diagnosis not present

## 2022-03-01 DIAGNOSIS — E7800 Pure hypercholesterolemia, unspecified: Secondary | ICD-10-CM | POA: Diagnosis not present

## 2022-03-01 DIAGNOSIS — R7309 Other abnormal glucose: Secondary | ICD-10-CM | POA: Diagnosis not present

## 2022-03-01 DIAGNOSIS — Z79899 Other long term (current) drug therapy: Secondary | ICD-10-CM | POA: Diagnosis not present

## 2022-03-01 DIAGNOSIS — N1832 Chronic kidney disease, stage 3b: Secondary | ICD-10-CM | POA: Diagnosis not present

## 2022-03-01 DIAGNOSIS — J431 Panlobular emphysema: Secondary | ICD-10-CM | POA: Diagnosis not present

## 2022-03-01 DIAGNOSIS — F3341 Major depressive disorder, recurrent, in partial remission: Secondary | ICD-10-CM | POA: Diagnosis not present

## 2022-06-10 DIAGNOSIS — F3341 Major depressive disorder, recurrent, in partial remission: Secondary | ICD-10-CM | POA: Diagnosis not present

## 2022-06-10 DIAGNOSIS — G894 Chronic pain syndrome: Secondary | ICD-10-CM | POA: Diagnosis not present

## 2022-06-10 DIAGNOSIS — R7309 Other abnormal glucose: Secondary | ICD-10-CM | POA: Diagnosis not present

## 2022-06-10 DIAGNOSIS — R7303 Prediabetes: Secondary | ICD-10-CM | POA: Diagnosis not present

## 2022-06-10 DIAGNOSIS — N184 Chronic kidney disease, stage 4 (severe): Secondary | ICD-10-CM | POA: Diagnosis not present

## 2022-06-10 DIAGNOSIS — E7800 Pure hypercholesterolemia, unspecified: Secondary | ICD-10-CM | POA: Diagnosis not present

## 2022-06-10 DIAGNOSIS — Z79899 Other long term (current) drug therapy: Secondary | ICD-10-CM | POA: Diagnosis not present

## 2022-06-10 DIAGNOSIS — J431 Panlobular emphysema: Secondary | ICD-10-CM | POA: Diagnosis not present

## 2022-06-10 DIAGNOSIS — Z0000 Encounter for general adult medical examination without abnormal findings: Secondary | ICD-10-CM | POA: Diagnosis not present

## 2022-06-10 DIAGNOSIS — N1832 Chronic kidney disease, stage 3b: Secondary | ICD-10-CM | POA: Diagnosis not present

## 2022-10-07 DIAGNOSIS — H903 Sensorineural hearing loss, bilateral: Secondary | ICD-10-CM | POA: Diagnosis not present

## 2022-10-10 DIAGNOSIS — Z79899 Other long term (current) drug therapy: Secondary | ICD-10-CM | POA: Diagnosis not present

## 2022-10-10 DIAGNOSIS — N1832 Chronic kidney disease, stage 3b: Secondary | ICD-10-CM | POA: Diagnosis not present

## 2022-10-10 DIAGNOSIS — Z1231 Encounter for screening mammogram for malignant neoplasm of breast: Secondary | ICD-10-CM | POA: Diagnosis not present

## 2022-10-10 DIAGNOSIS — E7800 Pure hypercholesterolemia, unspecified: Secondary | ICD-10-CM | POA: Diagnosis not present

## 2022-10-10 DIAGNOSIS — R7303 Prediabetes: Secondary | ICD-10-CM | POA: Diagnosis not present

## 2022-10-10 DIAGNOSIS — G894 Chronic pain syndrome: Secondary | ICD-10-CM | POA: Diagnosis not present

## 2022-10-10 DIAGNOSIS — J431 Panlobular emphysema: Secondary | ICD-10-CM | POA: Diagnosis not present

## 2022-10-10 DIAGNOSIS — M79672 Pain in left foot: Secondary | ICD-10-CM | POA: Diagnosis not present

## 2022-10-11 ENCOUNTER — Other Ambulatory Visit: Payer: Self-pay | Admitting: Internal Medicine

## 2022-10-11 DIAGNOSIS — Z1231 Encounter for screening mammogram for malignant neoplasm of breast: Secondary | ICD-10-CM

## 2022-10-24 DIAGNOSIS — M2041 Other hammer toe(s) (acquired), right foot: Secondary | ICD-10-CM | POA: Diagnosis not present

## 2022-10-24 DIAGNOSIS — M2042 Other hammer toe(s) (acquired), left foot: Secondary | ICD-10-CM | POA: Diagnosis not present

## 2022-10-24 DIAGNOSIS — M898X9 Other specified disorders of bone, unspecified site: Secondary | ICD-10-CM | POA: Diagnosis not present

## 2022-12-28 ENCOUNTER — Other Ambulatory Visit: Payer: Self-pay

## 2022-12-28 ENCOUNTER — Emergency Department
Admission: EM | Admit: 2022-12-28 | Discharge: 2022-12-28 | Disposition: A | Discharge status: Home / Self Care | Payer: PPO | Attending: Emergency Medicine | Admitting: Emergency Medicine

## 2022-12-28 ENCOUNTER — Emergency Department: Payer: PPO

## 2022-12-28 DIAGNOSIS — Z1152 Encounter for screening for COVID-19: Secondary | ICD-10-CM | POA: Diagnosis not present

## 2022-12-28 DIAGNOSIS — R0981 Nasal congestion: Secondary | ICD-10-CM | POA: Diagnosis not present

## 2022-12-28 DIAGNOSIS — J449 Chronic obstructive pulmonary disease, unspecified: Secondary | ICD-10-CM | POA: Insufficient documentation

## 2022-12-28 DIAGNOSIS — M79642 Pain in left hand: Secondary | ICD-10-CM | POA: Diagnosis not present

## 2022-12-28 DIAGNOSIS — N189 Chronic kidney disease, unspecified: Secondary | ICD-10-CM | POA: Insufficient documentation

## 2022-12-28 DIAGNOSIS — R5383 Other fatigue: Secondary | ICD-10-CM

## 2022-12-28 DIAGNOSIS — Z87891 Personal history of nicotine dependence: Secondary | ICD-10-CM | POA: Diagnosis not present

## 2022-12-28 DIAGNOSIS — M19042 Primary osteoarthritis, left hand: Secondary | ICD-10-CM | POA: Diagnosis not present

## 2022-12-28 DIAGNOSIS — R0602 Shortness of breath: Secondary | ICD-10-CM | POA: Insufficient documentation

## 2022-12-28 LAB — CBC
HCT: 39.9 % (ref 36.0–46.0)
Hemoglobin: 13 g/dL (ref 12.0–15.0)
MCH: 29.5 pg (ref 26.0–34.0)
MCHC: 32.6 g/dL (ref 30.0–36.0)
MCV: 90.7 fL (ref 80.0–100.0)
Platelets: 155 10*3/uL (ref 150–400)
RBC: 4.4 MIL/uL (ref 3.87–5.11)
RDW: 11.8 % (ref 11.5–15.5)
WBC: 11.1 10*3/uL — ABNORMAL HIGH (ref 4.0–10.5)
nRBC: 0 % (ref 0.0–0.2)

## 2022-12-28 LAB — TROPONIN I (HIGH SENSITIVITY)
Troponin I (High Sensitivity): 14 ng/L (ref <18)
Troponin I (High Sensitivity): 13 ng/L (ref <18)

## 2022-12-28 LAB — BASIC METABOLIC PANEL
Anion gap: 13 (ref 5–15)
BUN: 37 mg/dL — ABNORMAL HIGH (ref 8–23)
CO2: 20 mmol/L — ABNORMAL LOW (ref 22–32)
Calcium: 9.1 mg/dL (ref 8.9–10.3)
Chloride: 101 mmol/L (ref 98–111)
Creatinine, Ser: 1.71 mg/dL — ABNORMAL HIGH (ref 0.44–1.00)
GFR, Estimated: 30 mL/min — ABNORMAL LOW (ref >60)
Glucose, Bld: 146 mg/dL — ABNORMAL HIGH (ref 70–99)
Potassium: 4.8 mmol/L (ref 3.5–5.1)
Sodium: 134 mmol/L — ABNORMAL LOW (ref 135–145)

## 2022-12-28 LAB — RESP PANEL BY RT-PCR (RSV, FLU A&B, COVID)  RVPGX2
Influenza A by PCR: NEGATIVE
Influenza B by PCR: NEGATIVE
Resp Syncytial Virus by PCR: NEGATIVE
SARS Coronavirus 2 by RT PCR: NEGATIVE

## 2022-12-28 LAB — CBG MONITORING, ED: Glucose-Capillary: 94 mg/dL (ref 70–99)

## 2022-12-28 LAB — T4, FREE: Free T4: 1.31 ng/dL — ABNORMAL HIGH (ref 0.61–1.12)

## 2022-12-28 LAB — TSH: TSH: 6.677 u[IU]/mL — ABNORMAL HIGH (ref 0.350–4.500)

## 2022-12-28 MED ORDER — FLUTICASONE PROPIONATE 50 MCG/ACT NA SUSP: 1.0000 | Freq: Every day | NASAL | 0 refills | Status: AC

## 2022-12-28 NOTE — ED Provider Notes (Signed)
[facility] Provider Note    Event Date/Time   First MD Initiated Contact with Patient 12/28/22 [phone]     (approximate)   History   Shortness of Breath and Hand Pain   HPI  Brenda Wiley is a 82 y.o. female   Past medical history of former smoker, COPD, CKD, hyperlipidemia, depression who presents to the emergency department with "clammy" feeling, shortness of breath and left hand pain atraumatic.  She was in her regular state of health yesterday and awoke around 5 AM this morning with a sensation of feeling clammy, and a discomfort in the back of her throat/nose similar to no sore throat or nasal congestion, felt that she needed to breathe through her mouth to breathe easier, has a mild cough but is unchanged and feels like her chronic cough, no increase in sputum production no fever or chills.  She denies chest pain.  She also has left hand pain starting this morning as well.  Atraumatic.  Feels like at the base of the thumb.  No history of arthritis or gout.  She denies recent respiratory infectious symptoms, no GI or GU complaints.  She has no chest pain or shortness of breath, leg pain or swelling.   External Medical Documents Reviewed: [place] SPECT cardiac 2016 with an EF greater than 65%.  Internal medicine office visit dated December 2023 review her past medical history including former smoker and COPD and pan lobar emphysema, CKD      Physical Exam   Triage Vital Signs: ED Triage Vitals  Enc Vitals Group     BP --      Pulse --      Resp --      Temp --      Temp src --      SpO2 --      Weight 12/28/22 0905 143 lb 4.8 oz (65 kg)     Height 12/28/22 0905 '5\' 2"'$  (1.575 m)     Head Circumference --      Peak Flow --      Pain Score 12/28/22 0906 8     Pain Loc --      Pain Edu? --      Excl. in [place]? --     Most recent vital signs: Vitals:   12/28/22 1400 12/28/22 1430  BP: (!) 130/45 125/80  Pulse: (!) 45 (!) 49  Resp: 11 12  Temp:     SpO2: 93% 97%    General: Awake, no distress.  She does have some swelling on her forehead. CV:  Good peripheral perfusion.  Radial pulses are intact and equal bilaterally. Resp:  Normal effort.  No obvious wheezing or focality to my auscultation. Abd:  No distention.  Soft and nontender Other:  Awake alert oriented and initial vital signs are significant for normotension, no tachycardia no hypoxemia, overall normal.  She does have some point tenderness to the left thumb metacarpal area and full sensation and full range of motion no obvious swelling erythema or warmth   ED Results / Procedures / Treatments   Labs (all labs ordered are listed, but only abnormal results are displayed) Labs Reviewed  CBC - Abnormal; Notable for the following components:      Result Value   WBC 11.1 (*)    All other components within normal limits  BASIC METABOLIC PANEL - Abnormal; Notable for the following components:   Sodium 134 (*)    CO2 20 (*)  Glucose, Bld 146 (*)    BUN 37 (*)    Creatinine, Ser 1.71 (*)    GFR, Estimated 30 (*)    All other components within normal limits  TSH - Abnormal; Notable for the following components:   TSH 6.677 (*)    All other components within normal limits  T4, FREE - Abnormal; Notable for the following components:   Free T4 1.31 (*)    All other components within normal limits  RESP PANEL BY RT-PCR (RSV, FLU A&B, COVID)  [id number]  CBG MONITORING, ED  TROPONIN I (HIGH SENSITIVITY)  TROPONIN I (HIGH SENSITIVITY)     I ordered and reviewed the above labs they are notable for mildly elevated white blood cell count 11.1  EKG  ED ECG REPORT I, Lucillie Garfinkel, the attending physician, personally viewed and interpreted this ECG.   Date: 12/28/2022  EKG Time: 0916  Rate: 62  Rhythm: junctional   Axis: nl  Intervals: none  ST&T Change: no stemi    RADIOLOGY I independently reviewed and interpreted cxr and see no obvious focality or  pneumothorax   PROCEDURES:  Critical Care performed: No  Procedures   MEDICATIONS ORDERED IN ED: Medications - No data to display   IMPRESSION / MDM / [place] / ED COURSE  I reviewed the triage vital signs and the nursing notes.                                Patient's presentation is most consistent with acute presentation with potential threat to life or bodily function.  Differential diagnosis includes, but is not limited to, ACS, respiratory infection of viral URI, bacterial pneumonia, COPD exacerbation, considered but less likely dissection or PE in the setting of no chest pain or shortness of breath.  Left hand pain could be due to arthritis, trauma like fracture or dislocation or sprain, inflammatory condition, considered but less likely septic joint.   The patient is on the cardiac monitor to evaluate for evidence of arrhythmia and/or significant heart rate changes.  MDM: Atypical symptoms but clamminess and left hand pain need to assess for ACS with EKG which fortunately nonischemic and follow-up with serial troponins.  Her hand pain is atraumatic and she has some point tenderness to the metacarpal area will get an x-ray.  Very low clinical suspicion for septic arthritis.  Consider COPD exacerbation or respiratory infection given her nasal congestion/sore throat will check viral testing.  Her lung exam is not convincingly wheezing with focalities, will check chest x-ray.  Can trial albuterol for symptomatic relief given her history of COPD.  Considered but less likely PE or dissection given her symptoms and exam do not match with these diagnoses.  --- Blood test largely unremarkable, troponins flat patient feels somewhat better without any intervention in the emergency department.  Daughter is now at bedside stated that the patient has seemed markedly fatigued over the last few days with no focal infectious symptoms.  Her heart rate is low at 50 and  normotensive she does not take any blood pressure medications.  Will check thyroid tests given this new information.  I reviewed external medical notes including prior cardiology note in 2016 that noted sinus bradycardia in the 50s, so this is not a new bradycardia.  ---   I considered hospitalization for admission or observation but since workup as above has been negative for emergent pathologies and patient feels better  without any interventions I think outpatient follow-up most appropriate at this time.  She will follow-up with her PMD to review all test results today and ongoing evaluation as needed.  She understands to return to emergency department with any new or worsening symptoms.        FINAL CLINICAL IMPRESSION(S) / ED DIAGNOSES   Final diagnoses:  Other fatigue  Nasal congestion  Left hand pain     Rx / DC Orders   ED Discharge Orders          Ordered    fluticasone (FLONASE) 50 MCG/ACT nasal spray  Daily        12/28/22 1355             Note:  This document was prepared using Dragon voice recognition software and may include unintentional dictation errors.    Lucillie Garfinkel, MD 12/28/22 [phone]

## 2022-12-28 NOTE — ED Notes (Signed)
Called lab, trop and BMP are run on a machine that is down right now, machine should be repaired within next hour then they can run these labs.

## 2022-12-28 NOTE — Discharge Instructions (Addendum)
Take Flonase daily as prescribed for your nasal congestion.  Today your testing did not reveal any signs of medical emergencies that account for your symptoms.  However, if you experience any new or worsening symptoms it is important to come back to the emergency department for recheck.  Thank you for choosing [place] for your health care today!  Please see your primary doctor this week for a follow up appointment.   Sometimes, in the early stages of certain disease courses it is difficult to detect in the emergency department evaluation -- so, it is important that you continue to monitor your symptoms and call your doctor right away or return to the emergency department if you develop any new or worsening symptoms.  Please go to the following website to schedule new (and existing) patient appointments:   [email or web address]  If you do not have a primary doctor try calling the following clinics to establish care:  If you have insurance:  [facility] [phone] [place] [address]   [facility]  [phone] [address] [place] [address]   If you do not have insurance:  [facility]  [phone] [address], [place] [address]   The following is another list of primary care offices in the area who are accepting new patients at this time.  Please reach out to one of them directly and let them know you would like to schedule an appointment to follow up on an Emergency Department visit, and/or to establish a new primary care provider (PCP).  There are likely other primary care clinics in the are who are accepting new patients, but this is an excellent place to start:  [facility] physician: Dr Lavon Paganini [address] [place], [place] [address] [phone]  [facility] Lead Physician: Dr Steele Sizer [address],  [place], [place] [address] [phone]  [facility] Physician: Dr Park Liter [address] [place], [place] [address] [phone]  [facility] Lead Physician: Dr Dewaine Oats [place], [place], [place] [address] [phone]  [facility] Physician: Dr Halina Maidens [address] [place],  [address] [phone]   It was my pleasure to care for you today.   Hoover Brunette Jacelyn Grip, MD

## 2022-12-28 NOTE — ED Triage Notes (Signed)
Pt reports has been SOB, clammy feeling and left hand pain since this am around 0500. Pt denies CP, dizziness, HA or other sx's. Denies injuries to left hand.

## 2022-12-28 NOTE — ED Notes (Signed)
Pt denies CP. Pt appears diaphoretic (mildly). Respirations unlabored but R lower lobe very diminished. No wheezing heard. Hx COPD and former smoker.

## 2023-01-03 ENCOUNTER — Telehealth: Payer: Self-pay

## 2023-01-03 NOTE — Telephone Encounter (Signed)
        Patient  visited [facility] on 12/28/2022  for Shortness of Breath  Hand Pain.   Telephone encounter attempt :  1st  A HIPAA compliant voice message was left requesting a return call.  Instructed patient to call back at [phone].   [facility] Resource Care Guide   ??[email or web address]  ?? [id number]   Website: [email or web address]  [email or web address]

## 2023-01-06 ENCOUNTER — Telehealth: Payer: Self-pay

## 2023-01-06 NOTE — Telephone Encounter (Signed)
     Patient  visit on 12/28/2022  at [facility] was for Shortness of Breath  Hand Pain.  Have you been able to follow up with your primary care physician? Yes  The patient was or was not able to obtain any needed medicine or equipment. Patient was able to obtain medication.  Are there diet recommendations that you are having difficulty following? No  Patient expresses understanding of discharge instructions and education provided has no other needs at this time. Yes   [facility] Resource Care Guide   ??[email or web address]  ?? [id number]   Website: [email or web address]  [email or web address]

## 2023-01-16 DIAGNOSIS — R7303 Prediabetes: Secondary | ICD-10-CM | POA: Diagnosis not present

## 2023-01-16 DIAGNOSIS — J3489 Other specified disorders of nose and nasal sinuses: Secondary | ICD-10-CM | POA: Diagnosis not present

## 2023-01-16 DIAGNOSIS — Z79899 Other long term (current) drug therapy: Secondary | ICD-10-CM | POA: Diagnosis not present

## 2023-01-16 DIAGNOSIS — Z0000 Encounter for general adult medical examination without abnormal findings: Secondary | ICD-10-CM | POA: Diagnosis not present

## 2023-01-16 DIAGNOSIS — N1832 Chronic kidney disease, stage 3b: Secondary | ICD-10-CM | POA: Diagnosis not present

## 2023-01-16 DIAGNOSIS — G894 Chronic pain syndrome: Secondary | ICD-10-CM | POA: Diagnosis not present

## 2023-01-16 DIAGNOSIS — F3341 Major depressive disorder, recurrent, in partial remission: Secondary | ICD-10-CM | POA: Diagnosis not present

## 2023-01-16 DIAGNOSIS — Z1231 Encounter for screening mammogram for malignant neoplasm of breast: Secondary | ICD-10-CM | POA: Diagnosis not present

## 2023-01-16 DIAGNOSIS — E7800 Pure hypercholesterolemia, unspecified: Secondary | ICD-10-CM | POA: Diagnosis not present

## 2023-01-16 DIAGNOSIS — J431 Panlobular emphysema: Secondary | ICD-10-CM | POA: Diagnosis not present

## 2023-01-16 DIAGNOSIS — N184 Chronic kidney disease, stage 4 (severe): Secondary | ICD-10-CM | POA: Diagnosis not present

## 2023-01-20 ENCOUNTER — Other Ambulatory Visit: Payer: Self-pay | Admitting: Internal Medicine

## 2023-01-20 DIAGNOSIS — Z1231 Encounter for screening mammogram for malignant neoplasm of breast: Secondary | ICD-10-CM

## 2023-02-05 ENCOUNTER — Ambulatory Visit
Admission: RE | Admit: 2023-02-05 | Discharge: 2023-02-05 | Disposition: A | Discharge status: Home / Self Care | Payer: PPO | Source: Ambulatory Visit | Attending: Internal Medicine | Admitting: Internal Medicine

## 2023-02-05 ENCOUNTER — Other Ambulatory Visit: Payer: Self-pay | Admitting: Internal Medicine

## 2023-02-05 DIAGNOSIS — Z1231 Encounter for screening mammogram for malignant neoplasm of breast: Secondary | ICD-10-CM

## 2023-02-26 DIAGNOSIS — J3489 Other specified disorders of nose and nasal sinuses: Secondary | ICD-10-CM | POA: Diagnosis not present

## 2023-02-26 DIAGNOSIS — J342 Deviated nasal septum: Secondary | ICD-10-CM | POA: Diagnosis not present

## 2023-02-26 DIAGNOSIS — J301 Allergic rhinitis due to pollen: Secondary | ICD-10-CM | POA: Diagnosis not present

## 2023-03-20 ENCOUNTER — Ambulatory Visit
Admission: RE | Admit: 2023-03-20 | Discharge: 2023-03-20 | Disposition: A | Discharge status: Home / Self Care | Payer: PPO | Source: Ambulatory Visit | Attending: Physician Assistant | Admitting: Physician Assistant

## 2023-03-20 ENCOUNTER — Other Ambulatory Visit: Payer: Self-pay | Admitting: Physician Assistant

## 2023-03-20 DIAGNOSIS — M7989 Other specified soft tissue disorders: Secondary | ICD-10-CM | POA: Insufficient documentation

## 2023-03-20 DIAGNOSIS — J431 Panlobular emphysema: Secondary | ICD-10-CM | POA: Diagnosis not present

## 2023-03-20 DIAGNOSIS — G894 Chronic pain syndrome: Secondary | ICD-10-CM | POA: Diagnosis not present

## 2023-04-17 DIAGNOSIS — N1832 Chronic kidney disease, stage 3b: Secondary | ICD-10-CM | POA: Diagnosis not present

## 2023-04-17 DIAGNOSIS — J431 Panlobular emphysema: Secondary | ICD-10-CM | POA: Diagnosis not present

## 2023-04-17 DIAGNOSIS — E7800 Pure hypercholesterolemia, unspecified: Secondary | ICD-10-CM | POA: Diagnosis not present

## 2023-04-17 DIAGNOSIS — G894 Chronic pain syndrome: Secondary | ICD-10-CM | POA: Diagnosis not present

## 2023-04-17 DIAGNOSIS — Z79899 Other long term (current) drug therapy: Secondary | ICD-10-CM | POA: Diagnosis not present

## 2023-04-17 DIAGNOSIS — R7303 Prediabetes: Secondary | ICD-10-CM | POA: Diagnosis not present

## 2023-04-17 DIAGNOSIS — F3341 Major depressive disorder, recurrent, in partial remission: Secondary | ICD-10-CM | POA: Diagnosis not present

## 2023-06-04 DIAGNOSIS — M2042 Other hammer toe(s) (acquired), left foot: Secondary | ICD-10-CM | POA: Diagnosis not present

## 2023-06-04 DIAGNOSIS — M898X9 Other specified disorders of bone, unspecified site: Secondary | ICD-10-CM | POA: Diagnosis not present

## 2023-06-04 DIAGNOSIS — M2041 Other hammer toe(s) (acquired), right foot: Secondary | ICD-10-CM | POA: Diagnosis not present

## 2023-08-15 DIAGNOSIS — E7800 Pure hypercholesterolemia, unspecified: Secondary | ICD-10-CM | POA: Diagnosis not present

## 2023-08-15 DIAGNOSIS — Z79899 Other long term (current) drug therapy: Secondary | ICD-10-CM | POA: Diagnosis not present

## 2023-08-15 DIAGNOSIS — G894 Chronic pain syndrome: Secondary | ICD-10-CM | POA: Diagnosis not present

## 2023-08-15 DIAGNOSIS — F3341 Major depressive disorder, recurrent, in partial remission: Secondary | ICD-10-CM | POA: Diagnosis not present

## 2023-08-15 DIAGNOSIS — N1832 Chronic kidney disease, stage 3b: Secondary | ICD-10-CM | POA: Diagnosis not present

## 2023-08-15 DIAGNOSIS — J431 Panlobular emphysema: Secondary | ICD-10-CM | POA: Diagnosis not present

## 2023-08-15 DIAGNOSIS — R7303 Prediabetes: Secondary | ICD-10-CM | POA: Diagnosis not present

## 2023-09-27 ENCOUNTER — Other Ambulatory Visit: Payer: Self-pay

## 2023-09-27 ENCOUNTER — Inpatient Hospital Stay: Payer: PPO

## 2023-09-27 ENCOUNTER — Emergency Department: Payer: PPO

## 2023-09-27 ENCOUNTER — Inpatient Hospital Stay
Admission: EM | Admit: 2023-09-27 | Discharge: 2023-09-30 | DRG: 871 | Disposition: A | Discharge status: Home Health | Payer: PPO | Attending: Internal Medicine | Admitting: Internal Medicine

## 2023-09-27 DIAGNOSIS — Z881 Allergy status to other antibiotic agents status: Secondary | ICD-10-CM

## 2023-09-27 DIAGNOSIS — N179 Acute kidney failure, unspecified: Secondary | ICD-10-CM | POA: Diagnosis not present

## 2023-09-27 DIAGNOSIS — E871 Hypo-osmolality and hyponatremia: Secondary | ICD-10-CM | POA: Diagnosis present

## 2023-09-27 DIAGNOSIS — N1832 Chronic kidney disease, stage 3b: Secondary | ICD-10-CM | POA: Diagnosis present

## 2023-09-27 DIAGNOSIS — S0990XA Unspecified injury of head, initial encounter: Secondary | ICD-10-CM | POA: Diagnosis not present

## 2023-09-27 DIAGNOSIS — G9341 Metabolic encephalopathy: Secondary | ICD-10-CM | POA: Diagnosis not present

## 2023-09-27 DIAGNOSIS — Z885 Allergy status to narcotic agent status: Secondary | ICD-10-CM

## 2023-09-27 DIAGNOSIS — A419 Sepsis, unspecified organism: Secondary | ICD-10-CM | POA: Diagnosis not present

## 2023-09-27 DIAGNOSIS — R509 Fever, unspecified: Secondary | ICD-10-CM

## 2023-09-27 DIAGNOSIS — Z85828 Personal history of other malignant neoplasm of skin: Secondary | ICD-10-CM | POA: Diagnosis not present

## 2023-09-27 DIAGNOSIS — I700 Atherosclerosis of aorta: Secondary | ICD-10-CM | POA: Diagnosis not present

## 2023-09-27 DIAGNOSIS — E860 Dehydration: Secondary | ICD-10-CM | POA: Diagnosis present

## 2023-09-27 DIAGNOSIS — I2489 Other forms of acute ischemic heart disease: Secondary | ICD-10-CM | POA: Diagnosis present

## 2023-09-27 DIAGNOSIS — I517 Cardiomegaly: Secondary | ICD-10-CM | POA: Diagnosis not present

## 2023-09-27 DIAGNOSIS — I214 Non-ST elevation (NSTEMI) myocardial infarction: Secondary | ICD-10-CM | POA: Diagnosis not present

## 2023-09-27 DIAGNOSIS — Z90711 Acquired absence of uterus with remaining cervical stump: Secondary | ICD-10-CM | POA: Diagnosis not present

## 2023-09-27 DIAGNOSIS — R4182 Altered mental status, unspecified: Secondary | ICD-10-CM | POA: Diagnosis not present

## 2023-09-27 DIAGNOSIS — G9340 Encephalopathy, unspecified: Secondary | ICD-10-CM | POA: Diagnosis not present

## 2023-09-27 DIAGNOSIS — E785 Hyperlipidemia, unspecified: Secondary | ICD-10-CM | POA: Diagnosis not present

## 2023-09-27 DIAGNOSIS — I77819 Aortic ectasia, unspecified site: Secondary | ICD-10-CM | POA: Diagnosis not present

## 2023-09-27 DIAGNOSIS — G8929 Other chronic pain: Secondary | ICD-10-CM | POA: Diagnosis not present

## 2023-09-27 DIAGNOSIS — F32A Depression, unspecified: Secondary | ICD-10-CM | POA: Diagnosis not present

## 2023-09-27 DIAGNOSIS — B9620 Unspecified Escherichia coli [E. coli] as the cause of diseases classified elsewhere: Secondary | ICD-10-CM | POA: Diagnosis present

## 2023-09-27 DIAGNOSIS — Z043 Encounter for examination and observation following other accident: Secondary | ICD-10-CM | POA: Diagnosis not present

## 2023-09-27 DIAGNOSIS — Z87891 Personal history of nicotine dependence: Secondary | ICD-10-CM | POA: Diagnosis not present

## 2023-09-27 DIAGNOSIS — Z8249 Family history of ischemic heart disease and other diseases of the circulatory system: Secondary | ICD-10-CM | POA: Diagnosis not present

## 2023-09-27 DIAGNOSIS — A4151 Sepsis due to Escherichia coli [E. coli]: Principal | ICD-10-CM | POA: Diagnosis present

## 2023-09-27 DIAGNOSIS — R32 Unspecified urinary incontinence: Secondary | ICD-10-CM | POA: Diagnosis present

## 2023-09-27 DIAGNOSIS — W19XXXA Unspecified fall, initial encounter: Secondary | ICD-10-CM | POA: Diagnosis present

## 2023-09-27 DIAGNOSIS — N390 Urinary tract infection, site not specified: Secondary | ICD-10-CM | POA: Diagnosis not present

## 2023-09-27 DIAGNOSIS — Z66 Do not resuscitate: Secondary | ICD-10-CM | POA: Diagnosis present

## 2023-09-27 DIAGNOSIS — F419 Anxiety disorder, unspecified: Secondary | ICD-10-CM | POA: Diagnosis not present

## 2023-09-27 DIAGNOSIS — Z886 Allergy status to analgesic agent status: Secondary | ICD-10-CM

## 2023-09-27 DIAGNOSIS — Z79899 Other long term (current) drug therapy: Secondary | ICD-10-CM

## 2023-09-27 DIAGNOSIS — N189 Chronic kidney disease, unspecified: Secondary | ICD-10-CM

## 2023-09-27 DIAGNOSIS — R6520 Severe sepsis without septic shock: Secondary | ICD-10-CM | POA: Diagnosis not present

## 2023-09-27 DIAGNOSIS — R7881 Bacteremia: Secondary | ICD-10-CM | POA: Diagnosis present

## 2023-09-27 DIAGNOSIS — R0689 Other abnormalities of breathing: Secondary | ICD-10-CM | POA: Diagnosis not present

## 2023-09-27 DIAGNOSIS — R262 Difficulty in walking, not elsewhere classified: Secondary | ICD-10-CM | POA: Diagnosis not present

## 2023-09-27 DIAGNOSIS — I10 Essential (primary) hypertension: Secondary | ICD-10-CM

## 2023-09-27 DIAGNOSIS — Z880 Allergy status to penicillin: Secondary | ICD-10-CM | POA: Diagnosis not present

## 2023-09-27 DIAGNOSIS — K5730 Diverticulosis of large intestine without perforation or abscess without bleeding: Secondary | ICD-10-CM | POA: Diagnosis not present

## 2023-09-27 DIAGNOSIS — I129 Hypertensive chronic kidney disease with stage 1 through stage 4 chronic kidney disease, or unspecified chronic kidney disease: Secondary | ICD-10-CM | POA: Diagnosis not present

## 2023-09-27 DIAGNOSIS — R531 Weakness: Secondary | ICD-10-CM | POA: Diagnosis not present

## 2023-09-27 HISTORY — DX: Essential (primary) hypertension: I10

## 2023-09-27 LAB — CBC WITH DIFFERENTIAL/PLATELET
Abs Immature Granulocytes: 0.12 10*3/uL — ABNORMAL HIGH (ref 0.00–0.07)
Basophils Absolute: 0.1 10*3/uL (ref 0.0–0.1)
Basophils Relative: 0 %
Eosinophils Absolute: 0.1 10*3/uL (ref 0.0–0.5)
Eosinophils Relative: 1 %
HCT: 38.3 % (ref 36.0–46.0)
Hemoglobin: 12.8 g/dL (ref 12.0–15.0)
Immature Granulocytes: 1 %
Lymphocytes Relative: 4 %
Lymphs Abs: 0.6 10*3/uL — ABNORMAL LOW (ref 0.7–4.0)
MCH: 29.7 pg (ref 26.0–34.0)
MCHC: 33.4 g/dL (ref 30.0–36.0)
MCV: 88.9 fL (ref 80.0–100.0)
Monocytes Absolute: 1.5 10*3/uL — ABNORMAL HIGH (ref 0.1–1.0)
Monocytes Relative: 9 %
Neutro Abs: 13.6 10*3/uL — ABNORMAL HIGH (ref 1.7–7.7)
Neutrophils Relative %: 85 %
Platelets: 133 10*3/uL — ABNORMAL LOW (ref 150–400)
RBC: 4.31 MIL/uL (ref 3.87–5.11)
RDW: 12.4 % (ref 11.5–15.5)
WBC: 16 10*3/uL — ABNORMAL HIGH (ref 4.0–10.5)
nRBC: 0 % (ref 0.0–0.2)

## 2023-09-27 LAB — COMPREHENSIVE METABOLIC PANEL
ALT: 39 U/L (ref 0–44)
AST: 111 U/L — ABNORMAL HIGH (ref 15–41)
Albumin: 3.8 g/dL (ref 3.5–5.0)
Alkaline Phosphatase: 82 U/L (ref 38–126)
Anion gap: 14 (ref 5–15)
BUN: 55 mg/dL — ABNORMAL HIGH (ref 8–23)
CO2: 17 mmol/L — ABNORMAL LOW (ref 22–32)
Calcium: 8.7 mg/dL — ABNORMAL LOW (ref 8.9–10.3)
Chloride: 95 mmol/L — ABNORMAL LOW (ref 98–111)
Creatinine, Ser: 4.38 mg/dL — ABNORMAL HIGH (ref 0.44–1.00)
GFR, Estimated: 9 mL/min — ABNORMAL LOW (ref >60)
Glucose, Bld: 115 mg/dL — ABNORMAL HIGH (ref 70–99)
Potassium: 4.6 mmol/L (ref 3.5–5.1)
Sodium: 126 mmol/L — ABNORMAL LOW (ref 135–145)
Total Bilirubin: 1.2 mg/dL — ABNORMAL HIGH (ref <1.2)
Total Protein: 7.2 g/dL (ref 6.5–8.1)

## 2023-09-27 LAB — URINALYSIS, W/ REFLEX TO CULTURE (INFECTION SUSPECTED)
Bilirubin Urine: NEGATIVE
Glucose, UA: NEGATIVE mg/dL
Ketones, ur: NEGATIVE mg/dL
Nitrite: NEGATIVE
Protein, ur: 100 mg/dL — AB
Specific Gravity, Urine: 1.009 (ref 1.005–1.030)
WBC, UA: >50 WBC/hpf (ref 0–5)
pH: 5 (ref 5.0–8.0)

## 2023-09-27 LAB — PROCALCITONIN: Procalcitonin: 8.03 ng/mL

## 2023-09-27 LAB — RESP PANEL BY RT-PCR (RSV, FLU A&B, COVID)  RVPGX2
Influenza A by PCR: NEGATIVE
Influenza B by PCR: NEGATIVE
Resp Syncytial Virus by PCR: NEGATIVE
SARS Coronavirus 2 by RT PCR: NEGATIVE

## 2023-09-27 LAB — PROTIME-INR
INR: 1.2 (ref 0.8–1.2)
Prothrombin Time: 15.8 s — ABNORMAL HIGH (ref 11.4–15.2)

## 2023-09-27 LAB — TSH: TSH: 4.646 u[IU]/mL — ABNORMAL HIGH (ref 0.350–4.500)

## 2023-09-27 LAB — T4, FREE: Free T4: 1.75 ng/dL — ABNORMAL HIGH (ref 0.61–1.12)

## 2023-09-27 LAB — TROPONIN I (HIGH SENSITIVITY)
Troponin I (High Sensitivity): 138 ng/L (ref <18)
Troponin I (High Sensitivity): 112 ng/L (ref <18)

## 2023-09-27 LAB — APTT: aPTT: 35 s (ref 24–36)

## 2023-09-27 LAB — SODIUM, URINE, RANDOM: Sodium, Ur: 24 mmol/L

## 2023-09-27 LAB — OSMOLALITY: Osmolality: 283 mosm/kg (ref 275–295)

## 2023-09-27 LAB — LACTIC ACID, PLASMA
Lactic Acid, Venous: 2.1 mmol/L (ref 0.5–1.9)
Lactic Acid, Venous: 1.3 mmol/L (ref 0.5–1.9)

## 2023-09-27 LAB — OSMOLALITY, URINE: Osmolality, Ur: 203 mosm/kg — ABNORMAL LOW (ref 300–900)

## 2023-09-27 LAB — CREATININE, URINE, RANDOM: Creatinine, Urine: 63 mg/dL

## 2023-09-27 LAB — AMMONIA: Ammonia: <10 umol/L (ref 9–35)

## 2023-09-27 MED ORDER — LEVOFLOXACIN IN D5W 500 MG/100ML IV SOLN: 500.0000 mg | INTRAVENOUS | Status: DC

## 2023-09-27 MED ORDER — OLANZAPINE 10 MG IM SOLR
2.5000 mg | Freq: Four times a day (QID) | INTRAMUSCULAR | Status: DC | PRN
  Administered 2023-09-27: 2.5 mg via INTRAMUSCULAR
  Filled 2023-09-27: qty 10

## 2023-09-27 MED ORDER — ONDANSETRON HCL 4 MG/2ML IJ SOLN: 4.0000 mg | Freq: Four times a day (QID) | INTRAMUSCULAR | Status: DC | PRN

## 2023-09-27 MED ORDER — LEVOFLOXACIN IN D5W 750 MG/150ML IV SOLN
750.0000 mg | Freq: Once | INTRAVENOUS | Status: AC
  Administered 2023-09-27: 750 mg via INTRAVENOUS
  Filled 2023-09-27: qty 150

## 2023-09-27 MED ORDER — VANCOMYCIN HCL IN DEXTROSE 1-5 GM/200ML-% IV SOLN
1000.0000 mg | Freq: Once | INTRAVENOUS | Status: AC
  Administered 2023-09-27: 1000 mg via INTRAVENOUS
  Filled 2023-09-27: qty 200

## 2023-09-27 MED ORDER — HEPARIN SODIUM (PORCINE) 5000 UNIT/ML IJ SOLN
5000.0000 [IU] | Freq: Three times a day (TID) | INTRAMUSCULAR | Status: DC
  Administered 2023-09-27 – 2023-09-30 (×9): 5000 [IU] via SUBCUTANEOUS
  Filled 2023-09-27 (×9): qty 1

## 2023-09-27 MED ORDER — LACTATED RINGERS IV SOLN
150.0000 mL/h | INTRAVENOUS | Status: DC
  Administered 2023-09-27 – 2023-09-28 (×3): 150 mL/h via INTRAVENOUS

## 2023-09-27 MED ORDER — SODIUM CHLORIDE 0.9 % IV BOLUS (SEPSIS)
1500.0000 mL | Freq: Once | INTRAVENOUS | Status: AC
Start: 2023-09-27 — End: 2023-09-27
  Administered 2023-09-27: 1500 mL via INTRAVENOUS

## 2023-09-27 MED ORDER — ACETAMINOPHEN 325 MG PO TABS
650.0000 mg | ORAL_TABLET | Freq: Once | ORAL | Status: AC
  Administered 2023-09-27: 650 mg via ORAL
  Filled 2023-09-27: qty 2

## 2023-09-27 MED ORDER — ONDANSETRON HCL 4 MG PO TABS: 4.0000 mg | ORAL_TABLET | Freq: Four times a day (QID) | ORAL | Status: DC | PRN

## 2023-09-27 MED ORDER — ACETAMINOPHEN 500 MG PO TABS
1000.0000 mg | ORAL_TABLET | Freq: Four times a day (QID) | ORAL | Status: DC | PRN
  Administered 2023-09-28: 1000 mg via ORAL
  Filled 2023-09-27: qty 2

## 2023-09-27 MED ORDER — ALPRAZOLAM 0.5 MG PO TABS
0.2500 mg | ORAL_TABLET | Freq: Once | ORAL | Status: AC
  Administered 2023-09-27: 0.25 mg via ORAL
  Filled 2023-09-27: qty 1

## 2023-09-27 NOTE — Assessment & Plan Note (Signed)
Troponin 138 in the setting of overlapping sepsis as well as AKI No active chest pain at present Suspect secondary NSTEMI AKI and poor cardiac renal clearance confounding issue Trend troponin Noted aspirin allergy-defer for now Consider formal cardiology consult if significant uptrend Monitor

## 2023-09-27 NOTE — Assessment & Plan Note (Signed)
Baseline creatinine around 1.7 with GFR in the 30s Creatinine 4.38 with GFR less than 9 Suspect prerenal etiology in the setting of sepsis Will check CT abdomen pelvis to better correlate in the setting of urinary tract infection IV fluid hydration Hold nephrotoxic agents Monitor

## 2023-09-27 NOTE — ED Notes (Signed)
 CCMD called to monitor patient.

## 2023-09-27 NOTE — Assessment & Plan Note (Signed)
Meeting sepsis criteria with Tmax of one 1.3, heart rate 100s, white count 16 Noted urinary source IV Levaquin for infectious coverage in the setting of penicillin allergy Panculture Lactate 2.1 Trend with LR maintenance IV fluids Monitor

## 2023-09-27 NOTE — Consult Note (Signed)
Pharmacy Antibiotic Note  Latacha Puricelli is a 82 y.o. female admitted on 09/27/2023 with UTI.  Pharmacy has been consulted for Levaquin dosing. Noted allergy to cephalosporin. Per chart review patient had anaphylactic reaction with cephalosporins the past.  Plan: Levaquin 500mg  IV q 48hrs x 7 days  Height: 5\' 6"  (167.6 cm) Weight: 81.2 kg (179 lb) IBW/kg (Calculated) : 59.3  Temp (24hrs), Avg:101.3 F (38.5 C), Min:101.3 F (38.5 C), Max:101.3 F (38.5 C)  Recent Labs  Lab 09/27/23 1227 09/27/23 1439  WBC 16.0*  --   CREATININE 4.38*  --   LATICACIDVEN 2.1* 1.3    Estimated Creatinine Clearance: 10.5 mL/min (A) (by C-G formula based on SCr of 4.38 mg/dL (H)).    Allergies  Allergen Reactions   Ceclor [Cefaclor] Anaphylaxis    Anaphylaxis per cart review   Asa [Aspirin]    Codeine Itching    Will take if I have to   Antimicrobials this admission: Vancomycin 2000 mg IV x 1 Levaquin 500 mg IV x 48hrs   Dose adjustments this admission: Levaquin   Thank you for allowing pharmacy to be a part of this patient's care.  Renee Beale Rodriguez-Guzman PharmD, BCPS 09/27/2023 3:14 PM

## 2023-09-27 NOTE — Assessment & Plan Note (Signed)
Positive acute on cassette of lethargy with overall lapping sepsis and patient being found in her own urine today Urinalysis indicative of infection IV Levaquin for bacterial coverage Pancultured in setting of sepsis Monitor

## 2023-09-27 NOTE — ED Notes (Signed)
Messaged NP Jon Billings for Tylenol d/t pt increased temperature.

## 2023-09-27 NOTE — H&P (Signed)
History and Physical    Patient: Brenda Wiley ZHY:[id number] DOB: 08/14/1941 DOA: 09/27/2023 DOS: the patient was seen and examined on 09/27/2023 PCP: Marguarite Arbour, MD  Patient coming from: Home  Chief Complaint:  Chief Complaint  Patient presents with   Altered Mental Status   sepsis alert   HPI: Brenda Wiley is a 82 y.o. female with medical history significant of obesity, hyperlipidemia, hypertension, stage III CKD presenting with sepsis, encephalopathy, UTI, acute on chronic kidney disease, NSTEMI.  Limited history in the setting of encephalopathy.  Per report, patient noted to be confused by her neighbor.  Upon evaluation patient was found down being sold her own feces and urine.  No reported chest pain, shortness of breath.  No reported nausea or vomiting.  Per the neighbor, patient usually fairly active up until roughly 1 year ago.  Does live at home alone.  No recent medication changes.  No reported flank pain or dysuria.  Prior smoking history though quit some years ago per report. Presented to the ER Tmax one 1.3, heart rate 100s, BP stable.  Satting well on room air.  White count 16, hemoglobin 12.8, platelets 133, lactate 2.1-1.3.  Troponin 138.  Creatinine 4.4, sodium 126.  CT head and chest x-ray grossly within normal limits.  EKG sinus rhythm.  Started on sepsis protocol in the ER. Review of Systems: As mentioned in the history of present illness. All other systems reviewed and are negative. Past Medical History:  Diagnosis Date   Anal sphincter tear (healed) (old)-anterior 04/13/2014   Basal cell carcinoma    Chronic headaches    resolved   Depression    loss of spouse   Diverticulosis    Dizzy spells 02/23/2015   X 1year   HLD (hyperlipidemia)    HTN (hypertension) 09/27/2023   Past Surgical History:  Procedure Laterality Date   AUGMENTATION MAMMAPLASTY Bilateral    silicone implants put in over 30 yeras ago   CESAREAN SECTION     x 2   COLONOSCOPY      PARTIAL HYSTERECTOMY     TONSILLECTOMY     Social History:  reports that she quit smoking about 10 years ago. Her smoking use included cigarettes. She has never used smokeless tobacco. She reports that she does not drink alcohol and does not use drugs.  Allergies  Allergen Reactions   Ceclor [Cefaclor] Anaphylaxis    Anaphylaxis per cart review   Asa [Aspirin]    Codeine Itching    Will take if I have to    Family History  Problem Relation Age of Onset   Heart disease Father    Heart disease Mother    Cataracts Mother    Diabetes Other        maternal side   Heart disease Brother    Cancer Maternal Aunt        type unknown   Diverticulitis Other        nephew x 2   Cataracts Sister    Amblyopia Neg Hx    Blindness Neg Hx    Glaucoma Neg Hx    Macular degeneration Neg Hx    Retinal detachment Neg Hx    Strabismus Neg Hx    Retinitis pigmentosa Neg Hx     Prior to Admission medications   Medication Sig Start Date End Date Taking? Authorizing Provider  ALPRAZolam Prudy Feeler) 0.25 MG tablet Take by mouth. 08/17/18   [provider]  ALPRAZolam Prudy Feeler) 0.25 MG tablet TAKE  1 TABLET BY MOUTH NIGHTLY AS NEEDED FOR SLEEP 08/17/18   [provider]  FLUoxetine (PROZAC) 10 MG capsule Take 10 mg by mouth daily.  04/06/14   [provider]  fluticasone (FLONASE) 50 MCG/ACT nasal spray Place 1 spray into both nostrils daily. 12/28/22 03/28/23  Pilar Jarvis, MD  ketorolac (ACULAR) 0.5 % ophthalmic solution INSTILL 1 DROP IN LEFT EYE FOUR TIMES A DAY START 1 WEEK PRIOR TO SURGERY 09/01/18   [provider]  ofloxacin (OCUFLOX) 0.3 % ophthalmic solution INSTILL 1 DROP IN BOTH EYES FOUR TIMES A DAY START 72 HOURS PRIOR TO SURGERY 09/01/18   [provider]  Oxycodone HCl 10 MG TABS Take 10 mg by mouth 2 (two) times daily as needed. for pain 02/07/15   [provider]  Phentermine HCl 37.5 MG TBDP Take 18.75 mg by mouth daily.    [provider]  prednisoLONE acetate (PRED FORTE) 1 % ophthalmic suspension INSTILL 1 DROP IN LEFT EYE FOUR TIMES A DAY START AFTER SURGERY 09/01/18   [provider]    Physical Exam: Vitals:   09/27/23 1230 09/27/23 1231 09/27/23 1249 09/27/23 1300  BP: (!) 144/112  102/62 105/86  Pulse: (!) 107  76   Resp: 14  14 16   Temp:  (!) 101.3 F (38.5 C)    TempSrc:  Rectal    SpO2:   100%   Weight:      Height:       Physical Exam Constitutional:      Appearance: She is obese.  HENT:     Head: Normocephalic and atraumatic.     Nose: Nose normal.     Mouth/Throat:     Mouth: Mucous membranes are dry.  Eyes:     Pupils: Pupils are equal, round, and reactive to light.  Cardiovascular:     Rate and Rhythm: Normal rate and regular rhythm.  Pulmonary:     Effort: Pulmonary effort is normal.  Abdominal:     General: Bowel sounds are normal.  Musculoskeletal:        General: Normal range of motion.     Cervical back: Normal range of motion.  Skin:    General: Skin is dry.  Neurological:     General: No focal deficit present.  Psychiatric:        Mood and Affect: Mood normal.    Data Reviewed:  There are no new results to review at this time.  CT Head Wo Contrast CLINICAL DATA:  Head trauma.  EXAM: CT HEAD WITHOUT CONTRAST  TECHNIQUE: Contiguous axial images were obtained from the base of the skull through the vertex without intravenous contrast.  RADIATION DOSE REDUCTION: This exam was performed according to the departmental dose-optimization program which includes automated exposure control, adjustment of the mA and/or kV according to patient size and/or use of iterative reconstruction technique.  COMPARISON:  Brain MRI from 12/13/2018  FINDINGS: Brain: No evidence of acute infarction, hemorrhage, hydrocephalus, extra-axial collection or mass lesion/mass effect. There is mild diffuse low-attenuation within the subcortical and periventricular white  matter compatible with chronic microvascular disease. Left parietal subcortical white matter hypodensity is unchanged from previous MRI, image 20/2.  Vascular: No hyperdense vessel or unexpected calcification.  Skull: Normal. Negative for fracture or focal lesion.  Sinuses/Orbits: Paranasal sinuses and mastoid air cells are clear.  Other: None  IMPRESSION: 1. No acute intracranial abnormalities. 2. Chronic microvascular disease.  Electronically Signed   By: Veronda Prude.D.  On: 09/27/2023 13:18 DG Chest Port 1 View CLINICAL DATA:  Possible sepsis.  Fall.  EXAM: PORTABLE CHEST 1 VIEW  COMPARISON:  12/28/2022  FINDINGS: Numerous leads and wires project over the chest. Bilateral calcified breast implants. Midline trachea. Mild cardiomegaly. Atherosclerosis in the transverse aorta. No pleural effusion or pneumothorax. No congestive failure. Clear lungs.  IMPRESSION: Mild cardiomegaly, without pneumonia or acute disease  Aortic Atherosclerosis (ICD10-I70.0).  Electronically Signed   By: Jeronimo Greaves M.D.   On: 09/27/2023 13:09  Lab Results  Component Value Date   WBC 16.0 (H) 09/27/2023   HGB 12.8 09/27/2023   HCT 38.3 09/27/2023   MCV 88.9 09/27/2023   PLT 133 (L) 09/27/2023   Last metabolic panel Lab Results  Component Value Date   GLUCOSE 115 (H) 09/27/2023   NA 126 (L) 09/27/2023   K 4.6 09/27/2023   CL 95 (L) 09/27/2023   CO2 17 (L) 09/27/2023   BUN 55 (H) 09/27/2023   CREATININE 4.38 (H) 09/27/2023   GFRNONAA 9 (L) 09/27/2023   CALCIUM 8.7 (L) 09/27/2023   PROT 7.2 09/27/2023   ALBUMIN 3.8 09/27/2023   BILITOT 1.2 (H) 09/27/2023   ALKPHOS 82 09/27/2023   AST 111 (H) 09/27/2023   ALT 39 09/27/2023   ANIONGAP 14 09/27/2023    Assessment and Plan: * Sepsis (HCC) Meeting sepsis criteria with Tmax of one 1.3, heart rate 100s, white count 16 Noted urinary source IV Levaquin for infectious coverage in the setting of penicillin  allergy Panculture Lactate 2.1 Trend with LR maintenance IV fluids Monitor  UTI (urinary tract infection) Positive acute on cassette of lethargy with overall lapping sepsis and patient being found in her own urine today Urinalysis indicative of infection IV Levaquin for bacterial coverage Pancultured in setting of sepsis Monitor  NSTEMI (non-ST elevated myocardial infarction) (HCC) Troponin 138 in the setting of overlapping sepsis as well as AKI No active chest pain at present Suspect secondary NSTEMI AKI and poor cardiac renal clearance confounding issue Trend troponin Noted aspirin allergy-defer for now Consider formal cardiology consult if significant uptrend Monitor  Acute kidney injury superimposed on chronic kidney disease (HCC) Baseline creatinine around 1.7 with GFR in the 30s Creatinine 4.38 with GFR less than 9 Suspect prerenal etiology in the setting of sepsis Will check CT abdomen pelvis to better correlate in the setting of urinary tract infection IV fluid hydration Hold nephrotoxic agents Monitor  Hyponatremia Sodium 126 on presentation Clinically dry Suspect hypovolemia IV fluid hydration Trend sodium Goal 6-8 mill equivalent change over the next 12 to 24 hours Urine and serum studies  Acute encephalopathy Positive generalized lethargy and confusion in the setting of concurrent sepsis, UTI, NSTEMI, acute on chronic kidney injury Clinically dry on presentation with noted urinary tract infection and sepsis IV fluid hydration IV Levaquin for infectious coverage Suspect underlying dementia may be confounding issue CT head within normal limits Check ammonia level Monitor   HTN (hypertension) BP stable  Titrate home regimen     Greater than 50% was spent in counseling, critical care evaluation and coordination of care with patient Total encounter time 80 minutes or more    Advance Care Planning:   Code Status: Limited: Do not attempt  resuscitation (DNR) -DNR-LIMITED -Do Not Intubate/DNI    Consults: None   Family Communication: Family at the bedside   Severity of Illness: The appropriate patient status for this patient is INPATIENT. Inpatient status is judged to be reasonable and necessary in order to provide  the required intensity of service to ensure the patient's safety. The patient's presenting symptoms, physical exam findings, and initial radiographic and laboratory data in the context of their chronic comorbidities is felt to place them at high risk for further clinical deterioration. Furthermore, it is not anticipated that the patient will be medically stable for discharge from the hospital within 2 midnights of admission.   * I certify that at the point of admission it is my clinical judgment that the patient will require inpatient hospital care spanning beyond 2 midnights from the point of admission due to high intensity of service, high risk for further deterioration and high frequency of surveillance required.*  Author: Floydene Flock, MD 09/27/2023 3:12 PM  For on call review [email or web address].

## 2023-09-27 NOTE — ED Notes (Signed)
Patient placed on bedpan.

## 2023-09-27 NOTE — ED Triage Notes (Signed)
Pt to ED from home [facility] for AMS Pt fell yesterday, altered per family Answering orientation questions correctly per EMS  EMS VS: 101.9 axillary temp Strong urine odor, urinary frequency RR 23, etco2 25 96/43 then 114/50 CBG and 12 lead normal No IV  EDP at bedside

## 2023-09-27 NOTE — ED Notes (Signed)
Patient assisted to bedside commode.

## 2023-09-27 NOTE — Assessment & Plan Note (Signed)
Positive generalized lethargy and confusion in the setting of concurrent sepsis, UTI, NSTEMI, acute on chronic kidney injury Clinically dry on presentation with noted urinary tract infection and sepsis IV fluid hydration IV Levaquin for infectious coverage Suspect underlying dementia may be confounding issue CT head within normal limits Check ammonia level Monitor

## 2023-09-27 NOTE — Assessment & Plan Note (Signed)
Sodium 126 on presentation Clinically dry Suspect hypovolemia IV fluid hydration Trend sodium Goal 6-8 mill equivalent change over the next 12 to 24 hours Urine and serum studies

## 2023-09-27 NOTE — ED Notes (Signed)
Pt agitated, stating over and over "I can't get up." Unable to reorient pt with advising that she is not able to get out of bed.

## 2023-09-27 NOTE — Assessment & Plan Note (Signed)
BP stable Titrate home regimen 

## 2023-09-27 NOTE — ED Notes (Signed)
Brenda Charon, MD, made aware of lactic 2.1

## 2023-09-27 NOTE — ED Notes (Signed)
Patient bladder scanned. 266 mL.

## 2023-09-27 NOTE — ED Notes (Signed)
Pt cleaned and changed into hospital gown. Fresh linens on bed.

## 2023-09-27 NOTE — Consult Note (Addendum)
ED Pharmacy Antibiotic Sign Off An antibiotic consult was received from an ED provider for Vancomycin per pharmacy dosing for sepsis. A chart review was completed to assess appropriateness.   The following one time order(s) were placed:  Vancomycin 2gm IV x 1 dose  Further antibiotic and/or antibiotic pharmacy consults should be ordered by the admitting provider if indicated.   Thank you for allowing pharmacy to be a part of this patient's care.   Denario Bagot Rodriguez-Guzman PharmD, BCPS 09/27/2023 12:49 PM

## 2023-09-27 NOTE — ED Provider Notes (Addendum)
[facility] Provider Note    Event Date/Time   First MD Initiated Contact with Patient 09/27/23 1225     (approximate)   History   Altered Mental Status and sepsis alert   HPI  Brenda Wiley is a 82 y.o. female   Past medical history of depression and hyperlipidemia who presents to the emergency department with altered mental status and sepsis alert due to fever, tachycardia and hypotension.  The patient is altered and unable to provide an adequate history though she does state that she has been feeling short of breath with a cough and dysuria over the last several days.  I obtained collateral information with a phone call to her daughter and sister.  They note that the patient is typically fully functional lives independently and was last heard from on the telephone on Wednesday, normal.  Last night in the middle of the night the patient had called her sister and was noted to sound confused on the phone, when they arrived to visit her today she had fecal matter and urinary incontinence that soiled the couch.  Was covered in cold sweats and shivering/rigors. Complaining of myalgias. She was very altered and confused.  She noted that she had fallen at some point.  Independent Historian contributed to assessment above: Sister and daughter on the phone as above  External Medical Documents Reviewed: [facility] medical history and medications for past medical history, medications, and noted anaphylactic allergy to cephalosporin antibiotics      Physical Exam   Triage Vital Signs: ED Triage Vitals  Encounter Vitals Group     BP 09/27/23 1225 (!) 109/93     Systolic BP Percentile --      Diastolic BP Percentile --      Pulse Rate 09/27/23 1225 72     Resp 09/27/23 1225 (!) 22     Temp --      Temp src --      SpO2 09/27/23 1225 99 %     Weight 09/27/23 1227 179 lb (81.2 kg)     Height 09/27/23 1227 5\' 6"  (1.676 m)     Head Circumference --       Peak Flow --      Pain Score 09/27/23 1225 0     Pain Loc --      Pain Education --      Exclude from Growth Chart --     Most recent vital signs: Vitals:   09/27/23 1249 09/27/23 1300  BP: 102/62 105/86  Pulse: 76   Resp: 14 16  Temp:    SpO2: 100%     General: Awake, no distress.  CV:  Good peripheral perfusion.  Resp:  Normal effort.  Abd:  No distention.  Other:  Slow to answer questions, disoriented, but she is able to follow commands and move all extremities with equal strength, good sensation throughout, no facial asymmetry.  She has clear lungs and a soft nontender abdomen.  She is febrile and tachycardic.  There is no obvious head trauma.  Cracked lips dry mucous membranes looks dehydrated.   ED Results / Procedures / Treatments   Labs (all labs ordered are listed, but only abnormal results are displayed) Labs Reviewed  LACTIC ACID, PLASMA - Abnormal; Notable for the following components:      Result Value   Lactic Acid, Venous 2.1 (*)    All other components within normal limits  COMPREHENSIVE METABOLIC PANEL - Abnormal; Notable for  the following components:   Sodium 126 (*)    Chloride 95 (*)    CO2 17 (*)    Glucose, Bld 115 (*)    BUN 55 (*)    Creatinine, Ser 4.38 (*)    Calcium 8.7 (*)    AST 111 (*)    Total Bilirubin 1.2 (*)    GFR, Estimated 9 (*)    All other components within normal limits  CBC WITH DIFFERENTIAL/PLATELET - Abnormal; Notable for the following components:   WBC 16.0 (*)    Platelets 133 (*)    Neutro Abs 13.6 (*)    Lymphs Abs 0.6 (*)    Monocytes Absolute 1.5 (*)    Abs Immature Granulocytes 0.12 (*)    All other components within normal limits  RESP PANEL BY RT-PCR (RSV, FLU A&B, COVID)  [id number]  CULTURE, BLOOD (ROUTINE X 2)  CULTURE, BLOOD (ROUTINE X 2)  LACTIC ACID, PLASMA  URINALYSIS, W/ REFLEX TO CULTURE (INFECTION SUSPECTED)  TSH  T4, FREE  PROTIME-INR  APTT  TROPONIN I (HIGH SENSITIVITY)     I ordered and  reviewed the above labs they are notable for AKI with a creatinine 4 from a baseline of 1.  Hyponatremia 126.  Leukocytosis with a white blood cell count of 16.  EKG  ED ECG REPORT I, Pilar Jarvis, the attending physician, personally viewed and interpreted this ECG.   Date: 09/27/2023  EKG Time: 1236  Rate: 77  Rhythm: sinus  Axis: nl  Intervals:none  ST&T Change: no stemi    RADIOLOGY I independently reviewed and interpreted CT scan of the head and see no obvious bleeding or midline shift I also reviewed radiologist's formal read.   PROCEDURES:  Critical Care performed: Yes, see critical care procedure note(s)  .Critical Care  Performed by: Pilar Jarvis, MD Authorized by: Pilar Jarvis, MD   Critical care provider statement:    Critical care time (minutes):  30   Critical care was time spent personally by me on the following activities:  Development of treatment plan with patient or surrogate, discussions with consultants, evaluation of patient's response to treatment, examination of patient, ordering and review of laboratory studies, ordering and review of radiographic studies, ordering and performing treatments and interventions, pulse oximetry, re-evaluation of patient's condition and review of old charts    MEDICATIONS ORDERED IN ED: Medications  levofloxacin (LEVAQUIN) IVPB 750 mg (750 mg Intravenous New Bag/Given 09/27/23 1308)  vancomycin (VANCOCIN) IVPB 1000 mg/200 mL premix (1,000 mg Intravenous New Bag/Given 09/27/23 1315)    Followed by  vancomycin (VANCOCIN) IVPB 1000 mg/200 mL premix (has no administration in time range)  sodium chloride 0.9 % bolus 1,500 mL (1,500 mLs Intravenous New Bag/Given 09/27/23 1234)  acetaminophen (TYLENOL) tablet 650 mg (650 mg Oral Given 09/27/23 1252)    External physician / consultants:  I spoke with hospital medicine for admission and regarding care plan for this patient.   IMPRESSION / MDM / ASSESSMENT AND PLAN / ED COURSE  I  reviewed the triage vital signs and the nursing notes.                                Patient's presentation is most consistent with acute presentation with potential threat to life or bodily function.  Differential diagnosis includes, but is not limited to, sepsis due to UTI, respiratory infection, pneumonia, electrolyte disturbance, dehydration, ICH, thyroid dysfunction   The patient  is on the cardiac monitor to evaluate for evidence of arrhythmia and/or significant heart rate changes.  MDM:    Patient with concern of sepsis given tachycardia fever with potential sources including respiratory given her cough and shortness of breath versus urinary tract infection given her dysuria.  Cover for these most likely sources with levofloxacin and vancomycin, will avoid cephalosporin given her anaphylactic allergy as well as penicillins due to cross-reactivity potential.  Sepsis bolus crystalloid 30 cc per kg ideal body weight, Tylenol for antipyretic.  Given her altered mental status reports a fall while CT head to check for ICH intracranial abnormalities.  Doubt meningitis given no significant risk factors and no meningismus with other potential sources more likely at this time.  Admission.  -- Sepsis reevaluation, after initial fluid bolus, blood pressure markedly improved, pulse rate normalized, remains febrile, daughter at bedside and informed of care plan.  Urine looks infected. Seems that is the source.       FINAL CLINICAL IMPRESSION(S) / ED DIAGNOSES   Final diagnoses:  Sepsis, due to unspecified organism, unspecified whether acute organ dysfunction present (HCC)  Altered mental status, unspecified altered mental status type  AKI (acute kidney injury) (HCC)  Hyponatremia  Dehydration  Fever, unspecified fever cause     Rx / DC Orders   ED Discharge Orders     None        Note:  This document was prepared using Dragon voice recognition software and may include  unintentional dictation errors.    Pilar Jarvis, MD 09/27/23 1336    Pilar Jarvis, MD 09/27/23 1337    Pilar Jarvis, MD 09/27/23 1339    Pilar Jarvis, MD 09/27/23 1342    Pilar Jarvis, MD 09/27/23 [phone]

## 2023-09-28 ENCOUNTER — Inpatient Hospital Stay: Payer: PPO

## 2023-09-28 DIAGNOSIS — N390 Urinary tract infection, site not specified: Secondary | ICD-10-CM

## 2023-09-28 DIAGNOSIS — A419 Sepsis, unspecified organism: Secondary | ICD-10-CM

## 2023-09-28 DIAGNOSIS — F419 Anxiety disorder, unspecified: Secondary | ICD-10-CM | POA: Diagnosis present

## 2023-09-28 DIAGNOSIS — I2489 Other forms of acute ischemic heart disease: Secondary | ICD-10-CM | POA: Diagnosis present

## 2023-09-28 DIAGNOSIS — R7881 Bacteremia: Secondary | ICD-10-CM | POA: Diagnosis present

## 2023-09-28 LAB — BLOOD CULTURE ID PANEL (REFLEXED) - BCID2

## 2023-09-28 LAB — CBC
HCT: 32.7 % — ABNORMAL LOW (ref 36.0–46.0)
Hemoglobin: 10.9 g/dL — ABNORMAL LOW (ref 12.0–15.0)
MCH: 29.6 pg (ref 26.0–34.0)
MCHC: 33.3 g/dL (ref 30.0–36.0)
MCV: 88.9 fL (ref 80.0–100.0)
Platelets: 120 10*3/uL — ABNORMAL LOW (ref 150–400)
RBC: 3.68 MIL/uL — ABNORMAL LOW (ref 3.87–5.11)
RDW: 12.4 % (ref 11.5–15.5)
WBC: 10.3 10*3/uL (ref 4.0–10.5)
nRBC: 0 % (ref 0.0–0.2)

## 2023-09-28 LAB — COMPREHENSIVE METABOLIC PANEL
ALT: 37 U/L (ref 0–44)
AST: 87 U/L — ABNORMAL HIGH (ref 15–41)
Albumin: 3 g/dL — ABNORMAL LOW (ref 3.5–5.0)
Alkaline Phosphatase: 62 U/L (ref 38–126)
Anion gap: 11 (ref 5–15)
BUN: 52 mg/dL — ABNORMAL HIGH (ref 8–23)
CO2: 15 mmol/L — ABNORMAL LOW (ref 22–32)
Calcium: 8.1 mg/dL — ABNORMAL LOW (ref 8.9–10.3)
Chloride: 100 mmol/L (ref 98–111)
Creatinine, Ser: 3.73 mg/dL — ABNORMAL HIGH (ref 0.44–1.00)
GFR, Estimated: 12 mL/min — ABNORMAL LOW (ref >60)
Glucose, Bld: 105 mg/dL — ABNORMAL HIGH (ref 70–99)
Potassium: 3.8 mmol/L (ref 3.5–5.1)
Sodium: 126 mmol/L — ABNORMAL LOW (ref 135–145)
Total Bilirubin: 0.9 mg/dL (ref <1.2)
Total Protein: 5.9 g/dL — ABNORMAL LOW (ref 6.5–8.1)

## 2023-09-28 MED ORDER — FLUOXETINE HCL 20 MG PO CAPS
20.0000 mg | ORAL_CAPSULE | Freq: Every day | ORAL | Status: DC
Start: 2023-09-28 — End: 2023-09-30
  Administered 2023-09-29 – 2023-09-30 (×2): 20 mg via ORAL
  Filled 2023-09-28 (×3): qty 1

## 2023-09-28 MED ORDER — FLUTICASONE PROPIONATE 50 MCG/ACT NA SUSP
1.0000 | Freq: Every day | NASAL | Status: DC
  Administered 2023-09-29 – 2023-09-30 (×2): 1 via NASAL
  Filled 2023-09-28 (×2): qty 16

## 2023-09-28 MED ORDER — IMIPRAMINE HCL 10 MG PO TABS
10.0000 mg | ORAL_TABLET | Freq: Every day | ORAL | Status: DC
  Administered 2023-09-28 – 2023-09-29 (×2): 10 mg via ORAL
  Filled 2023-09-28 (×2): qty 1

## 2023-09-28 MED ORDER — ARIPIPRAZOLE 10 MG PO TABS
5.0000 mg | ORAL_TABLET | Freq: Every day | ORAL | Status: DC
Start: 2023-09-28 — End: 2023-09-30
  Administered 2023-09-28 – 2023-09-30 (×3): 5 mg via ORAL
  Filled 2023-09-28 (×3): qty 1

## 2023-09-28 MED ORDER — OXYCODONE HCL 5 MG PO TABS: 10.0000 mg | ORAL_TABLET | Freq: Two times a day (BID) | ORAL | Status: DC | PRN

## 2023-09-28 MED ORDER — BUSPIRONE HCL 10 MG PO TABS
10.0000 mg | ORAL_TABLET | Freq: Three times a day (TID) | ORAL | Status: DC
Start: 2023-09-28 — End: 2023-09-30
  Administered 2023-09-28 – 2023-09-30 (×6): 10 mg via ORAL
  Filled 2023-09-28 (×6): qty 1

## 2023-09-28 MED ORDER — TOPIRAMATE 25 MG PO TABS
50.0000 mg | ORAL_TABLET | Freq: Every day | ORAL | Status: DC
  Administered 2023-09-28 – 2023-09-29 (×2): 50 mg via ORAL
  Filled 2023-09-28 (×2): qty 2

## 2023-09-28 MED ORDER — DEXTROSE 5 % IV SOLN
0.5000 g | Freq: Three times a day (TID) | INTRAVENOUS | Status: DC
  Filled 2023-09-28 (×2): qty 2.5

## 2023-09-28 MED ORDER — LACTATED RINGERS IV SOLN: INTRAVENOUS | Status: AC

## 2023-09-28 MED ORDER — LACTATED RINGERS IV BOLUS
500.0000 mL | Freq: Once | INTRAVENOUS | Status: AC
  Administered 2023-09-28: 500 mL via INTRAVENOUS

## 2023-09-28 MED ORDER — OXYCODONE HCL 10 MG PO TABS: 10.0000 mg | ORAL_TABLET | Freq: Two times a day (BID) | ORAL | Status: DC | PRN

## 2023-09-28 MED ORDER — SODIUM CHLORIDE 0.9 % IV SOLN
1.0000 g | Freq: Three times a day (TID) | INTRAVENOUS | Status: DC
  Administered 2023-09-28 – 2023-09-30 (×7): 1 g via INTRAVENOUS
  Filled 2023-09-28: qty 5
  Filled 2023-09-28 (×2): qty 1
  Filled 2023-09-28 (×2): qty 5
  Filled 2023-09-28: qty 1
  Filled 2023-09-28 (×4): qty 5

## 2023-09-28 MED ORDER — ALPRAZOLAM 0.25 MG PO TABS
0.2500 mg | ORAL_TABLET | Freq: Two times a day (BID) | ORAL | Status: DC | PRN
  Administered 2023-09-28 – 2023-09-29 (×2): 0.25 mg via ORAL
  Filled 2023-09-28 (×2): qty 1

## 2023-09-28 NOTE — Evaluation (Signed)
Physical Therapy Evaluation Patient Details Name: Brenda Wiley MRN: [id number] DOB: July 27, 1941 Today's Date: 09/28/2023  History of Present Illness  82 y.o. female with medical history significant of obesity, hyperlipidemia, hypertension, stage III CKD presenting with sepsis, encephalopathy, UTI, acute on chronic kidney disease, NSTEMI.  Limited history in the setting of encephalopathy.  Per report, patient noted to be confused by her neighbor.  Clinical Impression  Pt pleasant and willing to participate with PT.  Pt reports that she is normally able to be very independent and active and has not ever had a need for RW or SPC.  She was A&O, but had moments of blank staring and difficulty following simple cues.  She was very slow and inconsistent with gait, not overly reliant on the walker but unable to maintain a cadence when trialing single UE/HHA ambulation.  Pt also struggled with static and dynamic balance exercises at EOB when challenged w/o UE support.  Pt did manage to walk nearly 100 ft with AD, but is far from her baseline.  She hopes to improve enough home but rightfully does not feel she would be safe to do so at this time.  Pt will benefit from PT to address functional limitations.       If plan is discharge home, recommend the following: A little help with walking and/or transfers;A little help with bathing/dressing/bathroom;Assistance with cooking/housework;Assist for transportation   Can travel by private vehicle   Yes    Equipment Recommendations Other (comment) (TBD, RW if going home)  Recommendations for Other Services       Functional Status Assessment Patient has had a recent decline in their functional status and demonstrates the ability to make significant improvements in function in a reasonable and predictable amount of time.     Precautions / Restrictions Precautions Precautions: Fall Restrictions Weight Bearing Restrictions: No      Mobility  Bed  Mobility Overal bed mobility: Needs Assistance Bed Mobility: Supine to Sit     Supine to sit: Contact guard, Min assist     General bed mobility comments: Pt was able to get to sitting EOB slowly and with HHA to pull up from, but otherwise did not need phyiscal assist    Transfers Overall transfer level: Needs assistance Equipment used: Rolling walker (2 wheels), None Transfers: Sit to/from Stand Sit to Stand: Contact guard assist, Min assist           General transfer comment: able to rise and maintain balance with FWW, no direct assist.  She was unsteady without AD needing assist to maintain static standing    Ambulation/Gait Ambulation/Gait assistance: Contact guard assist Gait Distance (Feet): 85 Feet Assistive device: Rolling walker (2 wheels)         General Gait Details: Pt was able to do a decent bout of ambulation with the walker but with slow and guarded gait that is far from her baseline. We did trial ~10 ft w/o AD, light UE use on hallrail with even slower and more hesitant gait.  No LOBs but needing consistent directional cues and encouragement for more consistent cadence.  Stairs            Wheelchair Mobility     Tilt Bed    Modified Rankin (Stroke Patients Only)       Balance Overall balance assessment: Needs assistance Sitting-balance support: Single extremity supported Sitting balance-Leahy Scale: Good     Standing balance support: Bilateral upper extremity supported Standing balance-Leahy Scale: Fair Standing balance comment:  Pt struggled with static standing tasks w/o UE support, no LOBs with HHA/walker but again clearly not at her baseline                             Pertinent Vitals/Pain Pain Assessment Pain Assessment: No/denies pain    Home Living Family/patient expects to be discharged to:: Unsure Living Arrangements: Alone Available Help at Discharge:  (reports she has basically no one local, daughter >1 hr  away)   Home Access: Stairs to enter Entrance Stairs-Rails: Left Entrance Stairs-Number of Steps: 3   Home Layout: One level Home Equipment: Shower seat      Prior Function Prior Level of Function : Independent/Modified Independent             Mobility Comments: Pt was able to do all she needs, drive, run errands, etc ADLs Comments: independent     Extremity/Trunk Assessment   Upper Extremity Assessment Upper Extremity Assessment: Generalized weakness;Overall [facility] for tasks assessed    Lower Extremity Assessment Lower Extremity Assessment: Generalized weakness;Overall WFL for tasks assessed       Communication   Communication Communication: No apparent difficulties  Cognition   Behavior During Therapy: WFL for tasks assessed/performed Overall Cognitive Status: Impaired/Different from baseline                                 General Comments: Pt pleasant, appropriately interactive and oriented, but endorses not feeling like her normal self "a little off"        General Comments General comments (skin integrity, edema, etc.): Pt reports mild dizziness intermittently, HR remains in the 80s t/o most of session.  Some DOE with activity, inconsistent pulseox reading with some inconsistently low numbers but when reading well seemed to stay in the 90s?    Exercises Other Exercises Other Exercises: HHA at EOB heel raises with b/l UE support, struggled to forward weight shift appropriately, marching in place with inability to lift either foot more than ~1" before LOB   Assessment/Plan    PT Assessment Patient needs continued PT services  PT Problem List Decreased strength;Decreased range of motion;Decreased activity tolerance;Decreased balance;Decreased mobility;Decreased cognition;Decreased knowledge of use of DME;Decreased safety awareness;Cardiopulmonary status limiting activity       PT Treatment Interventions DME instruction;Gait training;Stair  training;Functional mobility training;Therapeutic activities;Therapeutic exercise;Balance training;Patient/family education    PT Goals (Current goals can be found in the Care Plan section)  Acute Rehab PT Goals Patient Stated Goal: go home PT Goal Formulation: With patient Time For Goal Achievement: 10/11/23 Potential to Achieve Goals: Good    Frequency Min 1X/week     Co-evaluation               AM-PAC PT "6 Clicks" Mobility  Outcome Measure Help needed turning from your back to your side while in a flat bed without using bedrails?: None Help needed moving from lying on your back to sitting on the side of a flat bed without using bedrails?: None Help needed moving to and from a bed to a chair (including a wheelchair)?: A Little Help needed standing up from a chair using your arms (e.g., wheelchair or bedside chair)?: A Little Help needed to walk in hospital room?: A Little Help needed climbing 3-5 steps with a railing? : A Lot 6 Click Score: 19    End of Session Equipment Utilized During Treatment: Gait belt  Activity Tolerance: Patient limited by fatigue Patient left: in bed;with call bell/phone within reach Nurse Communication: Mobility status PT Visit Diagnosis: Unsteadiness on feet (R26.81);Muscle weakness (generalized) (M62.81);Difficulty in walking, not elsewhere classified (R26.2)    Time: 3664-4034 PT Time Calculation (min) (ACUTE ONLY): 32 min   Charges:     PT Treatments $Gait Training: 8-22 mins $Therapeutic Activity: 8-22 mins PT General Charges $$ ACUTE PT VISIT: 1 Visit         Malachi Pro, DPT 09/28/2023, 10:47 AM

## 2023-09-28 NOTE — ED Notes (Signed)
Advised nurse that patient has ready bed 

## 2023-09-28 NOTE — ED Notes (Signed)
This RN and another RN in to answer call bell. Pt needs to use the restroom. Pt assisted to the toilet, given a clean brief and assisted back to bed. Pt was very SOB upon walking to and from the bed. Pt ambulates with a walker. Family and pt advised she is always SOB when she walks.

## 2023-09-28 NOTE — Progress Notes (Signed)
PHARMACY - PHYSICIAN COMMUNICATION CRITICAL VALUE ALERT - BLOOD CULTURE IDENTIFICATION (BCID)  Brenda Wiley is an 82 y.o. female who presented to [facility] on 09/27/2023 with a chief complaint of sepsis.   Assessment:  E Coli in 1 of 4 bottles, no resistance detected.  (include suspected source if known)  Name of physician (or Provider) Contacted: Manuela Schwartz, NP   Current antibiotics: Levaquin 750 mg IV Q48H   Changes to prescribed antibiotics recommended:  Recommendations accepted by provider - will d/c levaquin and start aztreonam 1 gm IV Q8H  Results for orders placed or performed during the hospital encounter of 09/27/23  Blood Culture ID Panel (Reflexed) (Collected: 09/27/2023 12:27 PM)  Result Value Ref Range   Enterococcus faecalis NOT DETECTED NOT DETECTED   Enterococcus Faecium NOT DETECTED NOT DETECTED   Listeria monocytogenes NOT DETECTED NOT DETECTED   Staphylococcus species NOT DETECTED NOT DETECTED   Staphylococcus aureus (BCID) NOT DETECTED NOT DETECTED   Staphylococcus epidermidis NOT DETECTED NOT DETECTED   Staphylococcus lugdunensis NOT DETECTED NOT DETECTED   Streptococcus species NOT DETECTED NOT DETECTED   Streptococcus agalactiae NOT DETECTED NOT DETECTED   Streptococcus pneumoniae NOT DETECTED NOT DETECTED   Streptococcus pyogenes NOT DETECTED NOT DETECTED   A.calcoaceticus-baumannii NOT DETECTED NOT DETECTED   Bacteroides fragilis NOT DETECTED NOT DETECTED   Enterobacterales DETECTED (A) NOT DETECTED   Enterobacter cloacae complex NOT DETECTED NOT DETECTED   Escherichia coli DETECTED (A) NOT DETECTED   Klebsiella aerogenes NOT DETECTED NOT DETECTED   Klebsiella oxytoca NOT DETECTED NOT DETECTED   Klebsiella pneumoniae NOT DETECTED NOT DETECTED   Proteus species NOT DETECTED NOT DETECTED   Salmonella species NOT DETECTED NOT DETECTED   Serratia marcescens NOT DETECTED NOT DETECTED   Haemophilus influenzae NOT DETECTED NOT DETECTED   Neisseria  meningitidis NOT DETECTED NOT DETECTED   Pseudomonas aeruginosa NOT DETECTED NOT DETECTED   Stenotrophomonas maltophilia NOT DETECTED NOT DETECTED   Candida albicans NOT DETECTED NOT DETECTED   Candida auris NOT DETECTED NOT DETECTED   Candida glabrata NOT DETECTED NOT DETECTED   Candida krusei NOT DETECTED NOT DETECTED   Candida parapsilosis NOT DETECTED NOT DETECTED   Candida tropicalis NOT DETECTED NOT DETECTED   Cryptococcus neoformans/gattii NOT DETECTED NOT DETECTED   CTX-M ESBL NOT DETECTED NOT DETECTED   Carbapenem resistance IMP NOT DETECTED NOT DETECTED   Carbapenem resistance KPC NOT DETECTED NOT DETECTED   Carbapenem resistance NDM NOT DETECTED NOT DETECTED   Carbapenem resist OXA 48 LIKE NOT DETECTED NOT DETECTED   Carbapenem resistance VIM NOT DETECTED NOT DETECTED    [name] 09/28/2023  6:16 AM

## 2023-09-28 NOTE — ED Notes (Signed)
Pt given toothbrush, toothpaste and 2 cups of water per her request to brush her teeth.  Pt able to brush teeth independently.

## 2023-09-28 NOTE — ED Notes (Addendum)
Starr at bedside. Secure chat sent to MD Ophelia Charter to update family on patients status

## 2023-09-28 NOTE — ED Notes (Signed)
Family updated as to patient's status. (Sister Lawerance Cruel)

## 2023-09-28 NOTE — Progress Notes (Signed)
Progress Note   Patient: Brenda Wiley GNF:[id number] DOB: 07-11-1941 DOA: 09/27/2023     1 DOS: the patient was seen and examined on 09/28/2023   Brief hospital course: 82yo with h/o HTN, HLD, and stage 3b CKD who presented with AMS.  She was found to be septic from a urinary source.  She was also noted to have AKI and elevated troponin concerning for demand ischemia.    Assessment and Plan:  Sepsis due to UTI Presented with AMS, met sepsis criteria with Tmax of 101.3, heart rate 100s, white count 16, lactate 2.1 UA c/w UTI, likely urinary source IV Levaquin for infectious coverage in the setting of penicillin allergy (reported anaphylaxis to Ceclor, but patient denies any drug allergies - will ask pharmacy to consult)   Acute metabolic encephalopathy Presented with AMS in the setting of concurrent sepsis, UTI, AKI IV fluid hydration IV Levaquin for infectious coverage Appeared to be more cognitively clear this AM Has underlying psychiatric illness - continue home meds of Abilify, Xanax, buspirone, fluoxetine, imipramine, Topamax    Demand ischemia Troponin 138 -> 112 in the setting of overlapping sepsis as well as AKI No chest pain  Noted aspirin allergy-defer for now Consider formal cardiology consult if she develops chest pain   Acute kidney injury superimposed on stage 3b chronic kidney disease  Baseline creatinine around 1.7 with GFR in the 30s Creatinine 4.38 with GFR less than 9 on presentation, improved today to 3.73/12 Suspect prerenal etiology in the setting of sepsis Will check CT abdomen pelvis to better correlate in the setting of urinary tract infection IV fluid hydration Hold nephrotoxic agents   Hyponatremia Sodium 126 on presentation Clinically dry Suspect hypovolemia IV fluid hydration Trend sodium   Chronic pain I have reviewed this patient in the [place] Controlled Substances Reporting System.  She is receiving medications from only one provider and  appears to be taking them as prescribed. She is at increased risk of opioid misuse or diversion. Continue home Oxy.   Aortic dilation Infrarenal aorta dilated to 2.8 cm Recommend f/u US in 5 years  DNR Discussed on admission She will need a gold out of facility DNR form at the time of discharge    Consultants: PT OT TOC team  Procedures: None  Antibiotics: Vancomycin x 1 Aztreonam 12/8- Levaquin 12/7 x 1  30 Day Unplanned Readmission Risk Score    Flowsheet Row ED to Hosp-Admission (Current) from 09/27/2023 in [facility] Emergency Department at [facility]  30 Day Unplanned Readmission Risk Score (%) 17.67 Filed at 09/28/2023 0801       This score is the patient's risk of an unplanned readmission within 30 days of being discharged (0 -100%). The score is based on dignosis, age, lab data, medications, orders, and past utilization.   Low:  0-14.9   Medium: 15-21.9   High: 22-29.9   Extreme: 30 and above            Subjective: Feeling better.  Less confused.  Hoping to get better and go home rather than to rehab.  Physical Exam: Vitals:   09/28/23 0430 09/28/23 0719 09/28/23 0719 09/28/23 0720  BP:  (!) 125/59    Pulse:  67    Resp:  18    Temp: (!) 97.5 F (36.4 C)  97.8 F (36.6 C)   TempSrc: Oral  Oral   SpO2:  100%  100%  Weight:      Height:  Intake/Output Summary (Last 24 hours) at 09/28/2023 0846 Last data filed at 09/27/2023 1833 Gross per 24 hour  Intake --  Output 400 ml  Net -400 ml   Filed Weights   09/27/23 1227  Weight: 81.2 kg    Exam:  General:  Appears calm and comfortable and is in NAD, mildly tremulous Eyes:  EOMI, normal lids, iris ENT:  hard of hearing, grossly normal lips & tongue, mmm Neck:  no LAD, masses or thyromegaly Cardiovascular:  RRR, no m/r/g. No LE edema.  Respiratory:   CTA bilaterally with no wheezes/rales/rhonchi.  Normal respiratory effort. Abdomen:  soft, NT, ND Skin:  no rash or induration  seen on limited exam Musculoskeletal:  grossly normal tone BUE/BLE, good ROM, no bony abnormality Psychiatric:  grossly normal mood and affect, speech fluent and appropriate, AOx3  Neurologic:  CN 2-12 grossly intact, moves all extremities in coordinated fashion  Data Reviewed: I have reviewed the patient's lab results since admission.  Pertinent labs for today include:   Na++ 126, stable CO2 15 Glucose 105 BUN 52/Creatinine 3.73/GFR 12; 55/4.38/9 on 12/7, 29/1.6/32 on 10/25 Albumin 3.0 AST 87/ALT 37 HS troponin 138, 112 Lactate 2.1, 1.3 Procalcitonin 8.03 WBC 10.3, down from 16 Hgb 10.9 Platelets 120 BCID + E coli, Enterobacter UA: large Hgb, large LE, 100 protein, many bacteria    Family Communication: None present initially; daughter and SIL were present later in the day  Disposition: Status is: Inpatient Remains inpatient appropriate because: ongoing evaluation and management  Planned Discharge Destination:  TBD    Time spent: 50 minutes  Author: Jonah Blue, MD 09/28/2023 8:44 AM  For on call review [email or web address].

## 2023-09-28 NOTE — ED Notes (Signed)
Hospitalist at bedside 

## 2023-09-28 NOTE — ED Notes (Signed)
Pt provided with pericare and clean, dry disposable brief.

## 2023-09-28 NOTE — ED Notes (Signed)
This RN in room to assist with IV pump. PT in room to work with patient at this time.

## 2023-09-28 NOTE — Evaluation (Addendum)
Occupational Therapy Evaluation Patient Details Name: Brenda Wiley MRN: [id number] DOB: [birth year] Today's Date: 09/28/2023   History of Present Illness 82 y.o. female with medical history significant of obesity, hyperlipidemia, hypertension, stage III CKD presenting with sepsis, encephalopathy, UTI, acute on chronic kidney disease, NSTEMI.  Limited history in the setting of encephalopathy.  Per report, patient noted to be confused by her neighbor.   Clinical Impression   Patient received for OT evaluation. See flowsheet below for details of function. Generally, patient requiring supervision and extra time for bed mobility, CGA with RW for functional mobility, and set up-MIN A for ADLs. Cognition appears to be improving from admission; continue to recommend supervision-MIN A for ADLs and mobility given pt is typically able to mobilize without AD and today requires RW, as well as cognition is not quite back to baseline.  Patient will benefit from continued OT while in acute care.       If plan is discharge home, recommend the following: A little help with walking and/or transfers;A little help with bathing/dressing/bathroom;Assistance with cooking/housework;Direct supervision/assist for medications management;Assist for transportation;Help with stairs or ramp for entrance    Functional Status Assessment  Patient has had a recent decline in their functional status and demonstrates the ability to make significant improvements in function in a reasonable and predictable amount of time.  Equipment Recommendations  Other (comment) (defer to next venue of care.)    Recommendations for Other Services       Precautions / Restrictions Precautions Precautions: Fall Restrictions Weight Bearing Restrictions: No      Mobility Bed Mobility Overal bed mobility: Needs Assistance Bed Mobility: Supine to Sit, Sit to Supine     Supine to sit: Supervision Sit to supine: Supervision         Transfers Overall transfer level: Needs assistance Equipment used: Rolling walker (2 wheels), None Transfers: Sit to/from Stand Sit to Stand: Supervision           General transfer comment: Able to walk with CGA and RW to the commode in the room; appears to be relying on RW for balance.      Balance Overall balance assessment: Needs assistance Sitting-balance support: Single extremity supported Sitting balance-Leahy Scale: Good     Standing balance support: Bilateral upper extremity supported Standing balance-Leahy Scale: Fair Standing balance comment: Able to stand at sink momentarily for washing hands without UE support.                           ADL either performed or assessed with clinical judgement   ADL Overall ADL's : Needs assistance/impaired Eating/Feeding: Set up;Bed level   Grooming: Wash/dry hands;Supervision/safety;Standing     Upper Body Bathing Details (indicate cue type and reason): anticipate set up while seated on shower chair   Lower Body Bathing Details (indicate cue type and reason): will require seated on shower chair; anticipate supervision for safety   Upper Body Dressing Details (indicate cue type and reason): anticipate set up from seated only   Lower Body Dressing Details (indicate cue type and reason): anticipate MIN A during dynamic standing activity Toilet Transfer: Contact guard assist;Cueing for safety;Regular Toilet;Rolling walker (2 wheels) Toilet Transfer Details (indicate cue type and reason): did not have enough time to use grab bar, as she thought she would urinate urgently. Toileting- Clothing Manipulation and Hygiene: Minimal assistance;Sit to/from stand Toileting - Clothing Manipulation Details (indicate cue type and reason): OT assisted with incontinence brief; pt does  not wear incontinence brief at baseline; incontinence issues denied. Pt able to perform hygiene while seated on commode with L hand.   Tub/Shower  Transfer Details (indicate cue type and reason): anticipate pt will require assistance for safety, especially when shower is wet. Functional mobility during ADLs: Contact guard assist;Rolling walker (2 wheels) General ADL Comments: Decreased activity tolerance today; decreased balance; would recommend shower chair use.     Vision Patient Visual Report: No change from baseline       Perception         Praxis         Pertinent Vitals/Pain Pain Assessment Pain Assessment: No/denies pain     Extremity/Trunk Assessment Upper Extremity Assessment Upper Extremity Assessment: Generalized weakness;Left hand dominant   Lower Extremity Assessment Lower Extremity Assessment: Generalized weakness   Cervical / Trunk Assessment Cervical / Trunk Assessment: Normal   Communication Communication Communication: No apparent difficulties   Cognition Arousal: Alert Behavior During Therapy: WFL for tasks assessed/performed Overall Cognitive Status: Impaired/Different from baseline Area of Impairment: Orientation, Memory                 Orientation Level: Disoriented to, Time   Memory: Decreased short-term memory         General Comments: Pt is alert, pleasant. Mostly oriented; able to state the day of the week and the month; isn't exactly sure the date, although knows that her son's birthday is Monday the 9th, so with OT cues was able to figure out that today is the 8th. Knows that she had a fall; limited details of admission.     General Comments  New cough. No reports of dizziness during session. Pulseox reading inconsistent, as poor pleth. RN notified.    Exercises     Shoulder Instructions      Home Living Family/patient expects to be discharged to:: Private residence Living Arrangements: Alone Available Help at Discharge:  (no local support)   Home Access: Stairs to enter Entrance Stairs-Number of Steps: 3 Entrance Stairs-Rails: Left Home Layout: One level      Bathroom Shower/Tub: Producer, television/film/video: Standard     Home Equipment: Shower seat;Grab bars - tub/shower          Prior Functioning/Environment Prior Level of Function : Independent/Modified Independent;Driving             Mobility Comments: Drives, walks without AD; states she had one fall just prior to admission. ADLs Comments: (I) ADL/IADL, drives, does pill box for meds; enjoys watching soap operas.        OT Problem List: Decreased strength;Decreased activity tolerance;Decreased safety awareness;Decreased knowledge of use of DME or AE      OT Treatment/Interventions: Self-care/ADL training;Therapeutic exercise;DME and/or AE instruction;Therapeutic activities;Patient/family education    OT Goals(Current goals can be found in the care plan section) Acute Rehab OT Goals Patient Stated Goal: Get better. OT Goal Formulation: With patient Time For Goal Achievement: 10/12/23 Potential to Achieve Goals: Good ADL Goals Pt Will Perform Grooming: with modified independence;standing Pt Will Perform Lower Body Dressing: with modified independence;sit to/from stand Pt Will Transfer to Toilet: with modified independence;ambulating;regular height toilet Pt Will Perform Toileting - Clothing Manipulation and hygiene: with modified independence;sit to/from stand  OT Frequency: Min 1X/week    Co-evaluation              AM-PAC OT "6 Clicks" Daily Activity     Outcome Measure Help from another person eating meals?: None Help from another person  taking care of personal grooming?: A Little Help from another person toileting, which includes using toliet, bedpan, or urinal?: A Little Help from another person bathing (including washing, rinsing, drying)?: A Little Help from another person to put on and taking off regular upper body clothing?: None Help from another person to put on and taking off regular lower body clothing?: A Little 6 Click Score: 20   End of  Session Equipment Utilized During Treatment: Rolling walker (2 wheels);Other (comment) (gait belt for safety) Nurse Communication: Mobility status  Activity Tolerance: Patient tolerated treatment well Patient left: in bed;with call bell/phone within reach (set up with tray for lunch)  OT Visit Diagnosis: Unsteadiness on feet (R26.81);Muscle weakness (generalized) (M62.81)                Time: 1610-9604 OT Time Calculation (min): 25 min Charges:  OT General Charges $OT Visit: 1 Visit OT Evaluation $OT Eval Moderate Complexity: 1 Mod OT Treatments $Self Care/Home Management : 8-22 mins  Linward Foster, MS, OTR/L  Alvester Morin 09/28/2023, 12:01 PM

## 2023-09-28 NOTE — ED Notes (Signed)
PT at bedside.

## 2023-09-28 NOTE — Progress Notes (Signed)
Pharmacy Antibiotic Note  Brenda Wiley is a 82 y.o. female admitted on 09/27/2023 with sepsis.  E Coli in blood culture, no resistance.  Pt has anaphylaxis to ceclor, could not confirm she has had other cephalsporins or PCN previously.  Pharmacy has been consulted for aztreonam dosing.  Plan: Aztreonam 1 gm IV Q8H ordered to start on 12/8 @ 0700.   Height: 5\' 6"  (167.6 cm) Weight: 81.2 kg (179 lb) IBW/kg (Calculated) : 59.3  Temp (24hrs), Avg:99 F (37.2 C), Min:97.5 F (36.4 C), Max:101.7 F (38.7 C)  Recent Labs  Lab 09/27/23 1227 09/27/23 1439 09/28/23 0430  WBC 16.0*  --  10.3  CREATININE 4.38*  --  3.73*  LATICACIDVEN 2.1* 1.3  --     Estimated Creatinine Clearance: 12.3 mL/min (A) (by C-G formula based on SCr of 3.73 mg/dL (H)).    Allergies  Allergen Reactions   Ceclor [Cefaclor] Anaphylaxis    Anaphylaxis per cart review   Asa [Aspirin]    Codeine Itching    Will take if I have to    Antimicrobials this admission:   >>    >>   Dose adjustments this admission:   Microbiology results:  BCx:   UCx:    Sputum:    MRSA PCR:   Thank you for allowing pharmacy to be a part of this patient's care.  Rhonda Linan D 09/28/2023 6:13 AM

## 2023-09-29 DIAGNOSIS — A419 Sepsis, unspecified organism: Secondary | ICD-10-CM | POA: Diagnosis not present

## 2023-09-29 DIAGNOSIS — N390 Urinary tract infection, site not specified: Secondary | ICD-10-CM | POA: Diagnosis not present

## 2023-09-29 LAB — CBC WITH DIFFERENTIAL/PLATELET
Abs Immature Granulocytes: 0.05 10*3/uL (ref 0.00–0.07)
Basophils Absolute: 0 10*3/uL (ref 0.0–0.1)
Basophils Relative: 0 %
Eosinophils Absolute: 0.1 10*3/uL (ref 0.0–0.5)
Eosinophils Relative: 1 %
HCT: 32.9 % — ABNORMAL LOW (ref 36.0–46.0)
Hemoglobin: 11.2 g/dL — ABNORMAL LOW (ref 12.0–15.0)
Immature Granulocytes: 1 %
Lymphocytes Relative: 5 %
Lymphs Abs: 0.5 10*3/uL — ABNORMAL LOW (ref 0.7–4.0)
MCH: 28.7 pg (ref 26.0–34.0)
MCHC: 34 g/dL (ref 30.0–36.0)
MCV: 84.4 fL (ref 80.0–100.0)
Monocytes Absolute: 1.1 10*3/uL — ABNORMAL HIGH (ref 0.1–1.0)
Monocytes Relative: 11 %
Neutro Abs: 7.8 10*3/uL — ABNORMAL HIGH (ref 1.7–7.7)
Neutrophils Relative %: 82 %
Platelets: 153 10*3/uL (ref 150–400)
RBC: 3.9 MIL/uL (ref 3.87–5.11)
RDW: 12.5 % (ref 11.5–15.5)
WBC: 9.5 10*3/uL (ref 4.0–10.5)
nRBC: 0 % (ref 0.0–0.2)

## 2023-09-29 LAB — BASIC METABOLIC PANEL
Anion gap: 12 (ref 5–15)
BUN: 42 mg/dL — ABNORMAL HIGH (ref 8–23)
CO2: 17 mmol/L — ABNORMAL LOW (ref 22–32)
Calcium: 8.2 mg/dL — ABNORMAL LOW (ref 8.9–10.3)
Chloride: 104 mmol/L (ref 98–111)
Creatinine, Ser: 2.86 mg/dL — ABNORMAL HIGH (ref 0.44–1.00)
GFR, Estimated: 16 mL/min — ABNORMAL LOW (ref >60)
Glucose, Bld: 112 mg/dL — ABNORMAL HIGH (ref 70–99)
Potassium: 3.8 mmol/L (ref 3.5–5.1)
Sodium: 134 mmol/L — ABNORMAL LOW (ref 135–145)

## 2023-09-29 LAB — URINE CULTURE: Culture: <10000 — AB

## 2023-09-29 LAB — PROCALCITONIN: Procalcitonin: 3.97 ng/mL

## 2023-09-29 MED ORDER — SODIUM CHLORIDE 0.9 % IV SOLN: INTRAVENOUS | Status: AC

## 2023-09-29 MED ORDER — LACTATED RINGERS IV SOLN: INTRAVENOUS | Status: DC

## 2023-09-29 NOTE — Progress Notes (Signed)
Progress Note   Patient: Brenda Wiley WUJ:[id number] DOB: October 13, 1941 DOA: 09/27/2023     2 DOS: the patient was seen and examined on 09/29/2023   Brief hospital course: 82yo with h/o HTN, HLD, and stage 3b CKD who presented with AMS. She was found to be septic from a urinary source. She was also noted to have AKI and elevated troponin concerning for demand ischemia.   Assessment and Plan:  Sepsis due to UTI with E coli bacteremia Symptoms started around Thanksgiving with urinary symptoms and mild AMS Presented on 12/7 with AMS, met sepsis criteria with Tmax of 101.3, heart rate 100s, white count 16, lactate 2.1 Procalcitonin >8 UA c/w UTI, likely urinary source Blood cultures + for E coli, sensitivities are pending On IV Aztreonam for infectious coverage in the setting of penicillin allergy (reported anaphylaxis to Ceclor, but patient denies any drug allergies - will ask pharmacy to consult) Feeling significantly better today   Acute metabolic encephalopathy Presented with AMS in the setting of concurrent sepsis, UTI, AKI Improving, currently back to cognitive baseline Has underlying psychiatric illness - continue home meds of Abilify, Xanax, buspirone, fluoxetine, imipramine, Topamax    Demand ischemia Troponin 138 -> 112 in the setting of overlapping sepsis as well as AKI No chest pain  Noted aspirin allergy-defer for now Consider formal cardiology consult if she develops chest pain   Acute kidney injury superimposed on stage 3b chronic kidney disease  Baseline creatinine around 1.7 with GFR in the 30s Creatinine 4.38 with GFR less than 9 on presentation, improved to 3.73/12 on 12/8 and 2.86/16 today Suspect prerenal etiology in the setting of sepsis CT abdomen pelvis suggestive of R-sided pyelonephritis IV fluid hydration Hold nephrotoxic agents   Hyponatremia Sodium 126 on presentation, improved to 134 today after hydration Clinically dry Suspect hypovolemia IV fluid  hydration Trend sodium   Chronic pain I have reviewed this patient in the [place] Controlled Substances Reporting System.  She is receiving medications from only one provider and appears to be taking them as prescribed. She is at increased risk of opioid misuse or diversion. Continue home Oxy.    Aortic dilation Infrarenal aorta dilated to 2.8 cm Recommend f/u US in 5 years   DNR Discussed on admission She will need a gold out of facility DNR form at the time of discharge      Consultants: PT OT TOC team   Procedures: None   Antibiotics: Vancomycin x 1 Aztreonam 12/8- Levaquin 12/7 x 1  30 Day Unplanned Readmission Risk Score    Flowsheet Row ED to Hosp-Admission (Current) from 09/27/2023 in [facility]  30 Day Unplanned Readmission Risk Score (%) 19.06 Filed at 09/29/2023 0801       This score is the patient's risk of an unplanned readmission within 30 days of being discharged (0 -100%). The score is based on dignosis, age, lab data, medications, orders, and past utilization.   Low:  0-14.9   Medium: 15-21.9   High: 22-29.9   Extreme: 30 and above           Subjective: Feeling much better today.  Would like to go home but willing to stay at least 1 more day.   Recommended initially for SNF rehab but she is doing better enough that she believes she will be safe to go home tomorrow with home health.   Objective: Vitals:   09/29/23 0825 09/29/23 1614  BP: (!) 163/66 (!) 163/66  Pulse: 82 82  Resp:    Temp: 97.6 F (36.4 C) 98 F (36.7 C)  SpO2: 100% 100%    Intake/Output Summary (Last 24 hours) at 09/29/2023 1705 Last data filed at 09/29/2023 1220 Gross per 24 hour  Intake 1190.19 ml  Output 300 ml  Net 890.19 ml   Filed Weights   09/27/23 1227  Weight: 81.2 kg    Exam:  General:  Appears calm and comfortable and is in NAD Eyes:  EOMI, normal lids, iris ENT:  hard of hearing, grossly normal lips & tongue,  mmm Neck:  no LAD, masses or thyromegaly Cardiovascular:  RRR, no m/r/g. No LE edema.  Respiratory:   CTA bilaterally with no wheezes/rales/rhonchi.  Normal respiratory effort. Abdomen:  soft, NT, ND Skin:  no rash or induration seen on limited exam Musculoskeletal:  grossly normal tone BUE/BLE, good ROM, no bony abnormality Psychiatric:  grossly normal mood and affect, speech fluent and appropriate, AOx3  Neurologic:  CN 2-12 grossly intact, moves all extremities in coordinated fashion  Data Reviewed: I have reviewed the patient's lab results since admission.  Pertinent labs for today include:   Procalcitonin 3.97, down from 8.03 Na++ 134, up from 126 CO2 17, improved from 15 Glucose 112 BUN 42/Creatinine 2.86/GFR 16; 52/3.73/12 on 12/8 WBC 9.5 Hgb 11.2 - stable     Family Communication: Sister was present throughout evaluation  Disposition: Status is: Inpatient Remains inpatient appropriate because: ongoing management     Time spent: 50 minutes  Unresulted Labs (From admission, onward)     Start     Ordered   09/30/23 0500  CBC with Differential/Platelet  Tomorrow morning,   R        09/29/23 0814   09/30/23 0500  Basic metabolic panel  Tomorrow morning,   R        09/29/23 0814   09/29/23 1700  Culture, blood (Routine X 2) w Reflex to ID Panel  BLOOD CULTURE X 2,   TIMED      09/29/23 1659             Author: Jonah Blue, MD 09/29/2023 5:05 PM  For on call review [email or web address].

## 2023-09-29 NOTE — Plan of Care (Signed)

## 2023-09-29 NOTE — TOC Initial Note (Addendum)
Transition of Care [facility]) - Initial/Assessment Note    Patient Details  Name: Brenda Wiley MRN: [id number] Date of Birth: 28-Aug-1941  Transition of Care [facility]) CM/SW Contact:    Chapman Fitch, RN Phone Number: 09/29/2023, 3:57 PM  Clinical Narrative:                  Admitted for: sepsis UTI Admitted from: Home alone PCP: [facility] Current home health/prior home health/DME: NA  Discussed recs of SNF.  Patient declines.  Patient states that her sister Star lives locally and came come to check on her if needed. Patient states that sister will be transporting at discharge Patient agreeable to home health.  States she does not have a preference of agency.  Referral made to [facility] with [facility].  Patient agreeable to RW. Will obtain closer to discharge   MD updated  Expected Discharge Plan: Home w Home Health Services     Patient Goals and CMS Choice            Expected Discharge Plan and Services                                              Prior Living Arrangements/Services                       Activities of Daily Living   ADL Screening (condition at time of admission) Independently performs ADLs?: No Does the patient have a NEW difficulty with bathing/dressing/toileting/self-feeding that is expected to last >3 days?: Yes (Initiates electronic notice to provider for possible OT consult) Does the patient have a NEW difficulty with getting in/out of bed, walking, or climbing stairs that is expected to last >3 days?: Yes (Initiates electronic notice to provider for possible PT consult) Does the patient have a NEW difficulty with communication that is expected to last >3 days?: No Is the patient deaf or have difficulty hearing?: No Does the patient have difficulty seeing, even when wearing glasses/contacts?: No Does the patient have difficulty concentrating, remembering, or making decisions?: Yes  Permission Sought/Granted                   Emotional Assessment              Admission diagnosis:  Dehydration [E86.0] Hyponatremia [E87.1] AKI (acute kidney injury) (HCC) [N17.9] Sepsis (HCC) [A41.9] Fever, unspecified fever cause [R50.9] Urinary tract infection without hematuria, site unspecified [N39.0] Altered mental status, unspecified altered mental status type [R41.82] Sepsis, due to unspecified organism, unspecified whether acute organ dysfunction present [facility]) [A41.9] Patient Active Problem List   Diagnosis Date Noted   Demand ischemia (HCC) 09/28/2023   Anxiety disorder 09/28/2023   E coli bacteremia 09/28/2023   Sepsis secondary to UTI (HCC) 09/27/2023   UTI (urinary tract infection) 09/27/2023   NSTEMI (non-ST elevated myocardial infarction) (HCC) 09/27/2023   Acute kidney injury superimposed on chronic kidney disease (HCC) 09/27/2023   HTN (hypertension) 09/27/2023   Acute metabolic encephalopathy 09/27/2023   Hyponatremia 09/27/2023   Shortness of breath 02/17/2015   Smoker 02/17/2015   Hyperlipidemia 02/17/2015   Osteoarthritis of both hips 02/17/2015   Fecal incontinence due to anorectal disorder 04/13/2014   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 04/13/2014   PCP:  Marguarite Arbour, MD Pharmacy:   [phone] - [place], [place] - [address] [place] [address] Phone: [phone] Fax: [phone]     Social Determinants of Health (SDOH) Social History: SDOH Screenings   Food Insecurity: No Food Insecurity (09/28/2023)  Housing: Low Risk  (09/28/2023)  Transportation Needs: No Transportation Needs (09/28/2023)  Utilities: Not At Risk (09/28/2023)  Financial Resource Strain: Low Risk  (08/15/2023)   Received from [facility]  Tobacco Use: Medium Risk (09/27/2023)   SDOH Interventions:     Readmission Risk Interventions     No data to display

## 2023-09-29 NOTE — Hospital Course (Signed)
82yo with h/o HTN, HLD, and stage 3b CKD who presented with AMS. She was found to be septic from a urinary source. She was also noted to have AKI and elevated troponin concerning for demand ischemia.

## 2023-09-29 NOTE — Progress Notes (Signed)
Physical Therapy Treatment Patient Details Name: Brenda Wiley MRN: [id number] DOB: 02-Dec-1940 Today's Date: 09/29/2023   History of Present Illness 82 y.o. female with medical history significant of obesity, hyperlipidemia, hypertension, stage III CKD presenting with sepsis, encephalopathy, UTI, acute on chronic kidney disease, NSTEMI.  Limited history in the setting of encephalopathy.  Per report, patient noted to be confused by her neighbor.    PT Comments  Pt is making progress performing bed mobility and transfers with supervision and functional standing balance (with RW) and gait with RW  (95') with CGA. Pt stays inside walker and manuevers well with turns. Very short steps, pt reporting feeling off balance but had a slightly increase in speed with gait on return to room.  Continued PT will assist pt towards greater activity tolerance and balance with functional mobility.   If plan is discharge home, recommend the following: A little help with walking and/or transfers;A little help with bathing/dressing/bathroom;Assistance with cooking/housework;Assist for transportation   Can travel by private vehicle     Yes  Equipment Recommendations  Rolling walker (2 wheels);Other (comment) (TBD, RW if going home)    Recommendations for Other Services       Precautions / Restrictions Precautions Precautions: Fall Restrictions Weight Bearing Restrictions: No     Mobility  Bed Mobility Overal bed mobility: Needs Assistance Bed Mobility: Supine to Sit     Supine to sit: Supervision     General bed mobility comments: Pt was able to get to sitting EOB slowly Without HHA    Transfers Overall transfer level: Needs assistance Equipment used: Rolling walker (2 wheels) Transfers: Sit to/from Stand, Bed to chair/wheelchair/BSC Sit to Stand: Supervision   Step pivot transfers: Supervision            Ambulation/Gait Ambulation/Gait assistance: Contact guard assist Gait Distance  (Feet): 95 Feet Assistive device: Rolling walker (2 wheels) Gait Pattern/deviations: Step-through pattern, Decreased step length - right, Decreased step length - left       General Gait Details: pt stays inside walker and manuevers well with turns. Very short steps, pt reporting feeling off balance but had a slightly increase in speed with gait on return to room.   Stairs             Wheelchair Mobility     Tilt Bed    Modified Rankin (Stroke Patients Only)       Balance Overall balance assessment: Needs assistance Sitting-balance support: No upper extremity supported Sitting balance-Leahy Scale: Good     Standing balance support: Bilateral upper extremity supported Standing balance-Leahy Scale: Fair Standing balance comment: tends to keep weight on heels esp. with intial sit<>stands.  Required CGA to maintain staic standing balance with 1 UE support 2/2 posterior imbalance.                            Cognition Arousal: Alert Behavior During Therapy: WFL for tasks assessed/performed Overall Cognitive Status: Impaired/Different from baseline                   Orientation Level: Person, Place, Time, Situation                      Exercises      General Comments        Pertinent Vitals/Pain Pain Assessment Pain Assessment: No/denies pain    Home Living  Prior Function            PT Goals (current goals can now be found in the care plan section) Acute Rehab PT Goals Patient Stated Goal: go home PT Goal Formulation: With patient Time For Goal Achievement: 10/11/23 Potential to Achieve Goals: Good Progress towards PT goals: Progressing toward goals    Frequency    Min 1X/week      PT Plan      Co-evaluation              AM-PAC PT "6 Clicks" Mobility   Outcome Measure  Help needed turning from your back to your side while in a flat bed without using bedrails?: None Help  needed moving from lying on your back to sitting on the side of a flat bed without using bedrails?: None Help needed moving to and from a bed to a chair (including a wheelchair)?: A Little Help needed standing up from a chair using your arms (e.g., wheelchair or bedside chair)?: A Little Help needed to walk in hospital room?: A Little Help needed climbing 3-5 steps with a railing? : Total 6 Click Score: 18    End of Session Equipment Utilized During Treatment: Gait belt Activity Tolerance: Patient limited by fatigue Patient left: in chair;with call bell/phone within reach;with chair alarm set Nurse Communication: Mobility status PT Visit Diagnosis: Unsteadiness on feet (R26.81);Muscle weakness (generalized) (M62.81);Difficulty in walking, not elsewhere classified (R26.2)     Time: 0981-1914 PT Time Calculation (min) (ACUTE ONLY): 18 min  Charges:    $Therapeutic Activity: 8-22 mins PT General Charges $$ ACUTE PT VISIT: 1 Visit                     Hortencia Conradi, PTA  09/29/23, 10:23 AM

## 2023-09-30 DIAGNOSIS — A419 Sepsis, unspecified organism: Secondary | ICD-10-CM | POA: Diagnosis not present

## 2023-09-30 DIAGNOSIS — N390 Urinary tract infection, site not specified: Secondary | ICD-10-CM | POA: Diagnosis not present

## 2023-09-30 LAB — CBC WITH DIFFERENTIAL/PLATELET
Abs Immature Granulocytes: 0.09 10*3/uL — ABNORMAL HIGH (ref 0.00–0.07)
Basophils Absolute: 0 10*3/uL (ref 0.0–0.1)
Basophils Relative: 0 %
Eosinophils Absolute: 0.1 10*3/uL (ref 0.0–0.5)
Eosinophils Relative: 1 %
HCT: 31.3 % — ABNORMAL LOW (ref 36.0–46.0)
Hemoglobin: 10.9 g/dL — ABNORMAL LOW (ref 12.0–15.0)
Immature Granulocytes: 1 %
Lymphocytes Relative: 13 %
Lymphs Abs: 1.1 10*3/uL (ref 0.7–4.0)
MCH: 29.3 pg (ref 26.0–34.0)
MCHC: 34.8 g/dL (ref 30.0–36.0)
MCV: 84.1 fL (ref 80.0–100.0)
Monocytes Absolute: 1.2 10*3/uL — ABNORMAL HIGH (ref 0.1–1.0)
Monocytes Relative: 14 %
Neutro Abs: 6.3 10*3/uL (ref 1.7–7.7)
Neutrophils Relative %: 71 %
Platelets: 157 10*3/uL (ref 150–400)
RBC: 3.72 MIL/uL — ABNORMAL LOW (ref 3.87–5.11)
RDW: 12.5 % (ref 11.5–15.5)
WBC: 8.9 10*3/uL (ref 4.0–10.5)
nRBC: 0 % (ref 0.0–0.2)

## 2023-09-30 LAB — BASIC METABOLIC PANEL
Anion gap: 9 (ref 5–15)
BUN: 34 mg/dL — ABNORMAL HIGH (ref 8–23)
CO2: 20 mmol/L — ABNORMAL LOW (ref 22–32)
Calcium: 7.9 mg/dL — ABNORMAL LOW (ref 8.9–10.3)
Chloride: 107 mmol/L (ref 98–111)
Creatinine, Ser: 2.4 mg/dL — ABNORMAL HIGH (ref 0.44–1.00)
GFR, Estimated: 20 mL/min — ABNORMAL LOW (ref >60)
Glucose, Bld: 97 mg/dL (ref 70–99)
Potassium: 3.5 mmol/L (ref 3.5–5.1)
Sodium: 136 mmol/L (ref 135–145)

## 2023-09-30 LAB — CULTURE, BLOOD (ROUTINE X 2)

## 2023-09-30 MED ORDER — CIPROFLOXACIN HCL 500 MG PO TABS: 500.0000 mg | ORAL_TABLET | Freq: Every day | ORAL | 0 refills | Status: AC

## 2023-09-30 NOTE — TOC Transition Note (Signed)
Transition of Care [facility]) - CM/SW Discharge Note   Patient Details  Name: Manjit Stidd MRN: [id number] Date of Birth: 07-Aug-1941  Transition of Care [facility]) CM/SW Contact:  Chapman Fitch, RN Phone Number: 09/30/2023, 3:05 PM   Clinical Narrative:      Patient to discharge today Sister at bedside to transport [facility] with [facility] notified of discharge At discharge notified that patient would like a rollator instead of RW, and now would like a BSC.  Referral made to Jon with [employer] to be delivered to room       Patient Goals and CMS Choice      Discharge Placement                         Discharge Plan and Services Additional resources added to the After Visit Summary for                                       Social Determinants of Health (SDOH) Interventions SDOH Screenings   Food Insecurity: No Food Insecurity (09/28/2023)  Housing: Low Risk  (09/28/2023)  Transportation Needs: No Transportation Needs (09/28/2023)  Utilities: Not At Risk (09/28/2023)  Financial Resource Strain: Low Risk  (08/15/2023)   Received from [facility]  Tobacco Use: Medium Risk (09/27/2023)     Readmission Risk Interventions     No data to display

## 2023-09-30 NOTE — Care Management Important Message (Signed)
Important Message  Patient Details  Name: Brenda Wiley MRN: [id number] Date of Birth: [birth year]   Important Message Given:  Yes - Medicare IM     Verita Schneiders Amedio Bowlby 09/30/2023, 9:21 AM

## 2023-09-30 NOTE — Progress Notes (Signed)
Patient is not able to walk the distance required to go the bathroom, or he/she is unable to safely negotiate stairs required to access the bathroom.  A 3in1 BSC will alleviate this problem  

## 2023-09-30 NOTE — Progress Notes (Signed)
Physical Therapy Treatment Patient Details Name: Dazjah Kissam MRN: [id number] DOB: 1941/05/04 Today's Date: 09/30/2023   History of Present Illness 82 y.o. female with medical history significant of obesity, hyperlipidemia, hypertension, stage III CKD presenting with sepsis, encephalopathy, UTI, acute on chronic kidney disease, NSTEMI.  Limited history in the setting of encephalopathy.  Per report, patient noted to be confused by her neighbor.    PT Comments  Pt received in bed, agreed to PT, states her sister is coming to take her home today. Initial recommendations for SNF upon d/c which pt is declining. Session focused on assessing current functional mobility and safety. Pt able to demonstrate ability to perform bed mobility and transfers with distant Supervision/ModI. Gait training in hallway with multiple changes in direction and head turns without AD and with use of Rollator. Pt did not have any LOB, however remains slightly weak from hospital stay and appears safer and less of a fall risk with use of Rollator. Pt also states she would use the Rollator compared to RW at home. MD and CM notified of PT session. Pt left up in chair with all needs in reach.    If plan is discharge home, recommend the following: A little help with walking and/or transfers;A little help with bathing/dressing/bathroom;Assistance with cooking/housework;Assist for transportation   Can travel by private vehicle     Yes  Equipment Recommendations  Rollator (4 wheels)    Recommendations for Other Services       Precautions / Restrictions Precautions Precautions: Fall Restrictions Weight Bearing Restrictions: No     Mobility  Bed Mobility Overal bed mobility: Needs Assistance Bed Mobility: Supine to Sit     Supine to sit: Supervision Sit to supine: Supervision   General bed mobility comments: Pt was able to get to sitting EOB slowly Without HHA    Transfers Overall transfer level: Needs  assistance Equipment used: None Transfers: Sit to/from Stand Sit to Stand: Supervision           General transfer comment: Improved ability to perform sit<>stand transfers    Ambulation/Gait Ambulation/Gait assistance: Contact guard assist Gait Distance (Feet): 95 Feet Assistive device: Rollator (4 wheels), None Gait Pattern/deviations: Step-through pattern, Decreased step length - right, Decreased step length - left Gait velocity: decreased     General Gait Details: Pt ambulated with Rollator and without AD in hall, no LOB however appears more safe with Rollator   Stairs             Wheelchair Mobility     Tilt Bed    Modified Rankin (Stroke Patients Only)       Balance Overall balance assessment: Needs assistance Sitting-balance support: No upper extremity supported Sitting balance-Leahy Scale: Good     Standing balance support: No upper extremity supported, During functional activity Standing balance-Leahy Scale: Fair Standing balance comment: No LOB while ambulating without AD, however improved safety with use of Rollator                            Cognition Arousal: Alert Behavior During Therapy: WFL for tasks assessed/performed Overall Cognitive Status: Within Functional Limits for tasks assessed                                 General Comments: Pt A&Ox4, slightly delayed due to fatigue and hospital stay.        Exercises General Exercises -  Lower Extremity Ankle Circles/Pumps: AROM, Both, 10 reps, Seated Long Arc Quad: AROM, Both, 10 reps, Seated Hip Flexion/Marching: AROM, Both, 10 reps, Seated    General Comments General comments (skin integrity, edema, etc.): Pt educated on role of PT, reducing falls at home, benefits of HHPT services, and proper use of Rollator with good understanding      Pertinent Vitals/Pain Pain Assessment Pain Assessment: No/denies pain    Home Living                           Prior Function            PT Goals (current goals can now be found in the care plan section) Acute Rehab PT Goals Patient Stated Goal: go home Progress towards PT goals: Progressing toward goals    Frequency    Min 1X/week      PT Plan      Co-evaluation              AM-PAC PT "6 Clicks" Mobility   Outcome Measure  Help needed turning from your back to your side while in a flat bed without using bedrails?: None Help needed moving from lying on your back to sitting on the side of a flat bed without using bedrails?: None Help needed moving to and from a bed to a chair (including a wheelchair)?: A Little Help needed standing up from a chair using your arms (e.g., wheelchair or bedside chair)?: A Little Help needed to walk in hospital room?: A Little Help needed climbing 3-5 steps with a railing? : A Lot 6 Click Score: 19    End of Session Equipment Utilized During Treatment: Gait belt Activity Tolerance: Patient tolerated treatment well Patient left: in chair;with call bell/phone within reach;with chair alarm set Nurse Communication: Mobility status PT Visit Diagnosis: Unsteadiness on feet (R26.81);Muscle weakness (generalized) (M62.81);Difficulty in walking, not elsewhere classified (R26.2)     Time: 9323-5573 PT Time Calculation (min) (ACUTE ONLY): 31 min  Charges:    $Gait Training: 8-22 mins $Therapeutic Exercise: 8-22 mins PT General Charges $$ ACUTE PT VISIT: 1 Visit                    Zadie Cleverly, PTA  Jannet Askew 09/30/2023, 1:06 PM

## 2023-09-30 NOTE — Progress Notes (Signed)
Occupational Therapy Treatment Patient Details Name: Brenda Wiley MRN: [id number] DOB: November 04, 1940 Today's Date: 09/30/2023   History of present illness 82 y.o. female with medical history significant of obesity, hyperlipidemia, hypertension, stage III CKD presenting with sepsis, encephalopathy, UTI, acute on chronic kidney disease, NSTEMI.  Limited history in the setting of encephalopathy.  Per report, patient noted to be confused by her neighbor.   OT comments  Chart reviewed to date, pt greeted in chair, agreeable to OT tx session targeting improving activity tolerance for improved ADL completion. Improvements noted in overall mobility on this date with pt performing with CGA-supervision, amb in room with supervision with RW. Toileting performed with supervision. Sister in room and discussed with pt re: use of DME for safe ADL completion. Pt is left as received,all needs met. OT will follow acutely.       If plan is discharge home, recommend the following:  A little help with walking and/or transfers;A little help with bathing/dressing/bathroom;Assistance with cooking/housework;Direct supervision/assist for medications management;Assist for transportation;Help with stairs or ramp for entrance   Equipment Recommendations  BSC/3in1;Tub/shower seat    Recommendations for Other Services      Precautions / Restrictions Precautions Precautions: Fall Restrictions Weight Bearing Restrictions: No       Mobility Bed Mobility Overal bed mobility: Needs Assistance Bed Mobility: Supine to Sit     Supine to sit: Supervision          Transfers Overall transfer level: Needs assistance Equipment used: None, Rolling walker (2 wheels) Transfers: Sit to/from Stand Sit to Stand: Supervision (multiple attempts from various surfaces)                 Balance Overall balance assessment: Needs assistance Sitting-balance support: No upper extremity supported Sitting balance-Leahy  Scale: Good     Standing balance support: No upper extremity supported, During functional activity Standing balance-Leahy Scale: Fair                             ADL either performed or assessed with clinical judgement   ADL Overall ADL's : Needs assistance/impaired     Grooming: Sitting;Standing;Supervision/safety               Lower Body Dressing: Minimal assistance;Sitting/lateral leans   Toilet Transfer: Rolling walker (2 wheels);Ambulation;Regular Toilet;Contact guard assist Toilet Transfer Details (indicate cue type and reason): CGA for sit>stand due to lower toilet height, discussed use of bsc Toileting- Clothing Manipulation and Hygiene: Supervision/safety;Sitting/lateral lean       Functional mobility during ADLs: Supervision/safety;Rolling walker (2 wheels) (approx 15' in room two attempts)      Extremity/Trunk Assessment              Vision       Perception     Praxis      Cognition Arousal: Alert Behavior During Therapy: WFL for tasks assessed/performed Overall Cognitive Status: Impaired/Different from baseline Area of Impairment: Problem solving                             Problem Solving: Slow processing          Exercises Other Exercises Other Exercises: edu pt and sister re: role of OT, role of rehab, DME recomendations    Shoulder Instructions       General Comments spo2 >95% on RA throughout, pt with increased reported SOB while amb    Pertinent Vitals/  Pain       Pain Assessment Pain Assessment: No/denies pain  Home Living                                          Prior Functioning/Environment              Frequency  Min 1X/week        Progress Toward Goals  OT Goals(current goals can now be found in the care plan section)  Progress towards OT goals: Progressing toward goals  Acute Rehab OT Goals Time For Goal Achievement: 10/12/23  Plan      Co-evaluation                  AM-PAC OT "6 Clicks" Daily Activity     Outcome Measure   Help from another person eating meals?: None Help from another person taking care of personal grooming?: None Help from another person toileting, which includes using toliet, bedpan, or urinal?: A Little Help from another person bathing (including washing, rinsing, drying)?: A Little Help from another person to put on and taking off regular upper body clothing?: None Help from another person to put on and taking off regular lower body clothing?: A Little 6 Click Score: 21    End of Session Equipment Utilized During Treatment: Gait belt;Rolling walker (2 wheels)  OT Visit Diagnosis: Unsteadiness on feet (R26.81);Muscle weakness (generalized) (M62.81)   Activity Tolerance Patient tolerated treatment well   Patient Left in chair;with call bell/phone within reach;with chair alarm set   Nurse Communication Mobility status        Time: 9528-4132 OT Time Calculation (min): 29 min  Charges: OT General Charges $OT Visit: 1 Visit OT Treatments $Self Care/Home Management : 23-37 mins  Oleta Mouse, OTD OTR/L  09/30/23, 3:38 PM

## 2023-09-30 NOTE — Discharge Summary (Signed)
Physician Discharge Summary   Patient: Brenda Wiley MRN: [id number] DOB: Aug 26, 1941  Admit date:     09/27/2023  Discharge date: 09/30/23  Discharge Physician: Jonah Blue   PCP: Marguarite Arbour, MD   Recommendations at discharge:   You were here with a serious infection - complete antibiotics as prescribed (Ciprofloxacin daily for 4 days) Follow up with Dr. Judithann Sheen within 1 week; will need repeat blood work to ensure ongoing improvement in kidney functions Do not take over the counter pain medications other than Tylenol; avoid all NSAIDs (Advil, Motrin, Ibuprofen, Aleve, Naproxen, etc) You are being provided with a walker (Rollator) and home health PT/OT is ordered  Discharge Diagnoses: Principal Problem:   Sepsis secondary to UTI [facility]) Active Problems:   Acute kidney injury superimposed on chronic kidney disease (HCC)   HTN (hypertension)   Acute metabolic encephalopathy   Hyponatremia   Demand ischemia (HCC)   Anxiety disorder   E coli bacteremia    Hospital Course: 82yo with h/o HTN, HLD, and stage 3b CKD who presented with AMS. She was found to be septic from a urinary source. She was also noted to have AKI and elevated troponin concerning for demand ischemia.   Assessment and Plan:  Sepsis due to UTI with E coli bacteremia Symptoms started around Thanksgiving with urinary symptoms and mild AMS Presented on 12/7 with AMS, met sepsis criteria with Tmax of 101.3, heart rate 100s, white count 16, lactate 2.1 Procalcitonin >8 UA c/w UTI, likely urinary source Blood cultures + for E coli, pansensitive On IV Aztreonam for infectious coverage in the setting of penicillin allergy (reported anaphylaxis to Ceclor, but patient denies any drug allergies - will ask pharmacy to consult) -> Cipro 500 mg daily for 4 days (after discussion with pharmacist and ID pharmacist)   Acute metabolic encephalopathy Presented with AMS in the setting of concurrent sepsis, UTI,  AKI Improving, currently back to cognitive baseline Has underlying psychiatric illness - continue home meds of Abilify, Xanax, buspirone, fluoxetine, imipramine, Topamax    Demand ischemia Troponin 138 -> 112 in the setting of overlapping sepsis as well as AKI No chest pain  Noted aspirin allergy-defer for now   Acute kidney injury superimposed on stage 3b chronic kidney disease  Baseline creatinine around 1.7 with GFR in the 30s Creatinine 4.38 with GFR less than 9 on presentation, improved to 3.73/12 on 12/8 and 2.86/16 on 12/9, continuing to improve Suspect prerenal etiology in the setting of sepsis CT abdomen pelvis suggestive of R-sided pyelonephritis Continue to avoid nephrotoxic agents   Hyponatremia Sodium 126 on presentation, resolved after hydration   Chronic pain I have reviewed this patient in the [place] Controlled Substances Reporting System.  She is receiving medications from only one provider and appears to be taking them as prescribed. She is at increased risk of opioid misuse or diversion. Continue home Oxy.    Aortic dilation Infrarenal aorta dilated to 2.8 cm Recommend f/u US in 5 years   DNR Discussed on admission, patient and family agree that she prefers to be DNR  Ambulatory dysfunction Patient was profoundly weak on presentation She was recommended for SNF placement She has had improvement in global weakness with resolution of illness She declines SNF and appears to be safe for dc to home with home health Rollator ordered      Consultants: PT OT TOC team   Procedures: None   Antibiotics: Vancomycin x 1 Aztreonam 12/8- Levaquin 12/7 x 1   Pain control -  [place] Controlled Substance Reporting System database was reviewed. and patient was instructed, not to drive, operate heavy machinery, perform activities at heights, swimming or participation in water activities or provide baby-sitting services while on Pain, Sleep and Anxiety  Medications; until their outpatient Physician has advised to do so again. Also recommended to not to take more than prescribed Pain, Sleep and Anxiety Medications.   Disposition: Home Diet recommendation:  Regular diet DISCHARGE MEDICATION: Allergies as of 09/30/2023       Reactions   Ceclor [cefaclor] Anaphylaxis   Anaphylaxis per cart review, 12/10 spoke with patient and she reported taking Amoxicillin without issue   Asa [aspirin]    Codeine Itching   Will take if I have to        Medication List     TAKE these medications    ALPRAZolam 0.25 MG tablet Commonly known as: XANAX Take 0.25 mg by mouth 2 (two) times daily as needed for anxiety or sleep.   ARIPiprazole 5 MG tablet Commonly known as: ABILIFY Take 1 tablet by mouth daily.   busPIRone 10 MG tablet Commonly known as: BUSPAR Take 1 tablet by mouth 3 (three) times daily.   ciprofloxacin 500 MG tablet Commonly known as: Cipro Take 1 tablet (500 mg total) by mouth daily at 8 pm for 5 days.   FLUoxetine 20 MG capsule Commonly known as: PROZAC Take 1 capsule by mouth daily.   fluticasone 50 MCG/ACT nasal spray Commonly known as: FLONASE Place 1 spray into both nostrils daily.   imipramine 10 MG tablet Commonly known as: TOFRANIL Take 1 tablet by mouth at bedtime.   Oxycodone HCl 10 MG Tabs Take 10 mg by mouth 2 (two) times daily as needed. for pain   topiramate 50 MG tablet Commonly known as: TOPAMAX Take 1 tablet by mouth at bedtime.               Durable Medical Equipment  (From admission, onward)           Start     Ordered   09/30/23 1304  For home use only DME 4 wheeled rolling walker with seat  Once       Question:  Patient needs a walker to treat with the following condition  Answer:  Weakness   09/30/23 1304   09/30/23 1014  For home use only DME Walker rolling  Once       Question Answer Comment  Walker: With 5 Inch Wheels   Patient needs a walker to treat with the following  condition Weakness      09/30/23 1013            Discharge Exam:   Subjective: Feels ok today, not quite as perky because the bed is making her back hurt.  She would like to go home.   Objective: Vitals:   09/30/23 0257 09/30/23 0746  BP: (!) 142/64 (!) 152/59  Pulse: 71 79  Resp: 18   Temp: 98.4 F (36.9 C) 98.3 F (36.8 C)  SpO2: 95% 98%    Intake/Output Summary (Last 24 hours) at 09/30/2023 1405 Last data filed at 09/30/2023 0600 Gross per 24 hour  Intake 1729.49 ml  Output --  Net 1729.49 ml   Filed Weights   09/27/23 1227  Weight: 81.2 kg    Exam:  General:  Appears calm and comfortable and is in NAD, fatigued Eyes:  normal lids, iris ENT:  hard of hearing, grossly normal lips & tongue, mmm Neck:  no LAD, masses or thyromegaly Cardiovascular:  RRR, no m/r/g. No LE edema.  Respiratory:   CTA bilaterally with no wheezes/rales/rhonchi.  Normal respiratory effort. Abdomen:  soft, NT, ND Skin:  no rash or induration seen on limited exam Musculoskeletal:  grossly normal tone BUE/BLE, good ROM, no bony abnormality Psychiatric:  blunted mood and affect, speech fluent and appropriate, AOx3  Neurologic:  CN 2-12 grossly intact, moves all extremities in coordinated fashion  Data Reviewed: I have reviewed the patient's lab results since admission.  Pertinent labs for today include:   CO2 20, improved BUN 34/Creatinine 2.4/GFR 20, improved WBC 8.9 Hgb 10.9 Blood cultures E coli, pansensitive    Condition at discharge: improving  The results of significant diagnostics from this hospitalization (including imaging, microbiology, ancillary and laboratory) are listed below for reference.   Imaging Studies: DG Knee 1-2 Views Right  Result Date: 09/28/2023 CLINICAL DATA:  82 year old female status post fall at home. EXAM: RIGHT KNEE - 1-2 VIEW COMPARISON:  None Available. FINDINGS: AP and cross-table lateral views 1019 hours. Moderate medial compartment joint  space loss. Mild to moderate for age tricompartmental degenerative spurring. Small chronic appearing ossific fragment at the superior pole of the patella. No definite joint effusion on the cross-table lateral view. Other visible osseous structures appear intact. Calcified peripheral vascular disease. IMPRESSION: 1. Probably chronic small bone fragment at the superior pole of the patella. Query point tenderness. 2. No other acute fracture or dislocation identified about the right knee. 3. Generally mild for age tricompartmental degeneration. Electronically Signed   By: Odessa Fleming M.D.   On: 09/28/2023 10:39   CT ABDOMEN PELVIS WO CONTRAST  Result Date: 09/27/2023 CLINICAL DATA:  Fall yesterday. Clinical concern for sepsis. Fever. EXAM: CT ABDOMEN AND PELVIS WITHOUT CONTRAST TECHNIQUE: Multidetector CT imaging of the abdomen and pelvis was performed following the standard protocol without IV contrast. RADIATION DOSE REDUCTION: This exam was performed according to the departmental dose-optimization program which includes automated exposure control, adjustment of the mA and/or kV according to patient size and/or use of iterative reconstruction technique. COMPARISON:  05/08/2010. FINDINGS: Lower chest: Heart mildly enlarged. Interstitial thickening and ground-glass type opacities noted at the lung bases. No pleural effusion. Hepatobiliary: Unremarkable liver. Distended gallbladder with no wall thickening or adjacent inflammation. No evidence of stones. No bile duct dilation. Pancreas: Unremarkable. No pancreatic ductal dilatation or surrounding inflammatory changes. Spleen: Splenic calcifications consistent with healed granuloma. Normal spleen size. No mass. Adrenals/Urinary Tract: Normal adrenal glands. Kidneys normal in position. Mild dilation of the right intrarenal collecting system and proximal right ureter. No renal or ureteral stones. No renal masses. Left renal collecting system and ureter are unremarkable. Non  dependent air within the bladder. No bladder wall thickening, mass or stone. Stomach/Bowel: Small hiatal hernia. Stomach is otherwise unremarkable. Small bowel and colon are normal in caliber. No wall thickening. No inflammation. Numerous colonic diverticula without evidence of diverticulitis. No evidence of appendicitis. Vascular/Lymphatic: Aortic atherosclerosis. Mild dilation of the infrarenal aorta 2 2.8 x 2.5 cm. No enlarged lymph nodes. Reproductive: Status post hysterectomy. No adnexal masses. Other: No abdominal wall hernia or abnormality. No abdominopelvic ascites. Musculoskeletal: No fracture or acute finding.  No bone lesion. IMPRESSION: 1. Mild dilation of the right intrarenal collecting system and proximal right ureter without evidence of an obstructing stone or mass. This is of uncertain etiology, but raises suspicion for right-sided pyelonephritis 2. Air within the bladder. Correlate with any recent instrumentation. 3. Colonic diverticulosis without evidence of diverticulitis.  4. Aortic atherosclerosis. 5. Mild dilation of the infrarenal aorta to 2.8 cm. Recommend follow-up ultrasound every 5 years. 6. Interstitial thickening and ground-glass type opacities at the lung bases. This is nonspecific but could reflect edema or an infectious/inflammatory process. Aortic Atherosclerosis (ICD10-I70.0). Electronically Signed   By: Amie Portland M.D.   On: 09/27/2023 16:21   CT Head Wo Contrast  Result Date: 09/27/2023 CLINICAL DATA:  Head trauma. EXAM: CT HEAD WITHOUT CONTRAST TECHNIQUE: Contiguous axial images were obtained from the base of the skull through the vertex without intravenous contrast. RADIATION DOSE REDUCTION: This exam was performed according to the departmental dose-optimization program which includes automated exposure control, adjustment of the mA and/or kV according to patient size and/or use of iterative reconstruction technique. COMPARISON:  Brain MRI from 12/13/2018 FINDINGS: Brain:  No evidence of acute infarction, hemorrhage, hydrocephalus, extra-axial collection or mass lesion/mass effect. There is mild diffuse low-attenuation within the subcortical and periventricular white matter compatible with chronic microvascular disease. Left parietal subcortical white matter hypodensity is unchanged from previous MRI, image 20/2. Vascular: No hyperdense vessel or unexpected calcification. Skull: Normal. Negative for fracture or focal lesion. Sinuses/Orbits: Paranasal sinuses and mastoid air cells are clear. Other: None IMPRESSION: 1. No acute intracranial abnormalities. 2. Chronic microvascular disease. Electronically Signed   By: Signa Kell M.D.   On: 09/27/2023 13:18   DG Chest Port 1 View  Result Date: 09/27/2023 CLINICAL DATA:  Possible sepsis.  Fall. EXAM: PORTABLE CHEST 1 VIEW COMPARISON:  12/28/2022 FINDINGS: Numerous leads and wires project over the chest. Bilateral calcified breast implants. Midline trachea. Mild cardiomegaly. Atherosclerosis in the transverse aorta. No pleural effusion or pneumothorax. No congestive failure. Clear lungs. IMPRESSION: Mild cardiomegaly, without pneumonia or acute disease Aortic Atherosclerosis (ICD10-I70.0). Electronically Signed   By: Jeronimo Greaves M.D.   On: 09/27/2023 13:09    Microbiology: Results for orders placed or performed during the hospital encounter of 09/27/23  Urine Culture     Status: Abnormal   Collection Time: 09/27/23 12:23 PM   Specimen: Urine, Random  Result Value Ref Range Status   Specimen Description   Final    URINE, RANDOM Performed at [facility], [address], [place], [place] [address]    Special Requests   Final    NONE Reflexed from [id number] Performed at [facility], [address] [place], [place] [address]    Culture (A)  Final    <10,000 COLONIES/mL INSIGNIFICANT GROWTH Performed at [facility], [address], [place], [place] [address]    Report Status 09/29/2023 FINAL   Final  Blood Culture (routine x 2)     Status: Abnormal   Collection Time: 09/27/23 12:27 PM   Specimen: BLOOD  Result Value Ref Range Status   Specimen Description   Final    BLOOD BLOOD LEFT FOREARM Performed at [facility], [address], [place], [place] [address]    Special Requests   Final    BOTTLES DRAWN AEROBIC AND ANAEROBIC Blood Culture adequate volume Performed at [facility], [address] [place], [place] [address]    Culture  Setup Time   Final    GRAM NEGATIVE RODS AEROBIC BOTTLE ONLY CRITICAL RESULT CALLED TO, READ BACK BY AND VERIFIED WITH: JASON ROBBINS AT 0540 09/28/23.PMF    Culture ESCHERICHIA COLI (A)  Final   Report Status 09/30/2023 FINAL  Final   Organism ID, Bacteria ESCHERICHIA COLI  Final   Organism ID, Bacteria ESCHERICHIA COLI  Final      Susceptibility   Escherichia coli - KIRBY BAUER*    CEFAZOLIN SENSITIVE Sensitive    Escherichia coli - MIC*    AMPICILLIN <=2 SENSITIVE Sensitive     CEFEPIME <=0.12 SENSITIVE Sensitive     CEFTAZIDIME <=1 SENSITIVE Sensitive     CEFTRIAXONE <=0.25 SENSITIVE Sensitive     CIPROFLOXACIN <=0.25 SENSITIVE Sensitive     GENTAMICIN <=1 SENSITIVE Sensitive     IMIPENEM <=0.25 SENSITIVE Sensitive     TRIMETH/SULFA <=20 SENSITIVE Sensitive     AMPICILLIN/SULBACTAM <=2 SENSITIVE Sensitive     PIP/TAZO <=4 SENSITIVE Sensitive ug/mL    * ESCHERICHIA COLI    ESCHERICHIA COLI  Resp panel by RT-PCR (RSV, Flu A&B, Covid) Anterior Nasal Swab     Status: None   Collection Time: 09/27/23 12:27 PM   Specimen: Anterior Nasal Swab  Result Value Ref Range Status   SARS Coronavirus 2 by RT PCR NEGATIVE NEGATIVE Final    Comment: (NOTE) SARS-CoV-2 target nucleic acids are NOT DETECTED.  The SARS-CoV-2 RNA is generally detectable in upper respiratory specimens during the acute phase of infection. The lowest concentration of SARS-CoV-2 viral copies this assay can detect is 138 copies/mL. A negative  result does not preclude SARS-Cov-2 infection and should not be used as the sole basis for treatment or other patient management decisions. A negative result may occur with  improper specimen collection/handling, submission of specimen other than nasopharyngeal swab, presence of viral mutation(s) within the areas targeted by this assay, and inadequate number of viral copies(<138 copies/mL). A negative result must be combined with clinical observations, patient history, and epidemiological information. The expected result is Negative.  Fact Sheet for Patients:  BloggerCourse.com  Fact Sheet for Healthcare Providers:  SeriousBroker.it  This test is no t yet approved or cleared by the [place] FDA and  has been authorized for detection and/or diagnosis of SARS-CoV-2 by FDA under an Emergency Use Authorization (EUA). This EUA will remain  in effect (meaning this test can be used) for the duration of the COVID-19 declaration under Section 564(b)(1) of the Act, 21 U.S.C.section 360bbb-3(b)(1), unless the authorization is terminated  or revoked sooner.       Influenza A by PCR NEGATIVE NEGATIVE Final   Influenza B by PCR NEGATIVE NEGATIVE Final    Comment: (NOTE) The Xpert Xpress SARS-CoV-2/FLU/RSV plus assay is intended as an aid in the diagnosis of influenza from Nasopharyngeal swab specimens and should not be used as a sole basis for treatment. Nasal washings and aspirates are unacceptable for Xpert Xpress SARS-CoV-2/FLU/RSV testing.  Fact Sheet for Patients: BloggerCourse.com  Fact Sheet for Healthcare Providers: SeriousBroker.it  This test is not yet approved or cleared by the [place] FDA and has been authorized for detection and/or diagnosis of SARS-CoV-2 by FDA under an Emergency Use Authorization (EUA). This EUA will remain in effect (meaning this test can be used)  for the duration of the COVID-19 declaration under Section 564(b)(1) of the Act, 21 U.S.C. section 360bbb-3(b)(1), unless the authorization is terminated or revoked.     Resp Syncytial Virus by PCR NEGATIVE NEGATIVE Final    Comment: (NOTE) Fact Sheet for Patients: BloggerCourse.com  Fact Sheet for Healthcare Providers: SeriousBroker.it  This test is not yet approved or cleared by the [place] FDA and has been authorized for detection and/or diagnosis of SARS-CoV-2 by FDA under an Emergency Use Authorization (EUA). This EUA will remain in effect (meaning this test can be used) for  the duration of the COVID-19 declaration under Section 564(b)(1) of the Act, 21 U.S.C. section 360bbb-3(b)(1), unless the authorization is terminated or revoked.  Performed at [facility], [address] [place], [place] [address]   Blood Culture ID Panel (Reflexed)     Status: Abnormal   Collection Time: 09/27/23 12:27 PM  Result Value Ref Range Status   Enterococcus faecalis NOT DETECTED NOT DETECTED Final   Enterococcus Faecium NOT DETECTED NOT DETECTED Final   Listeria monocytogenes NOT DETECTED NOT DETECTED Final   Staphylococcus species NOT DETECTED NOT DETECTED Final   Staphylococcus aureus (BCID) NOT DETECTED NOT DETECTED Final   Staphylococcus epidermidis NOT DETECTED NOT DETECTED Final   Staphylococcus lugdunensis NOT DETECTED NOT DETECTED Final   Streptococcus species NOT DETECTED NOT DETECTED Final   Streptococcus agalactiae NOT DETECTED NOT DETECTED Final   Streptococcus pneumoniae NOT DETECTED NOT DETECTED Final   Streptococcus pyogenes NOT DETECTED NOT DETECTED Final   A.calcoaceticus-baumannii NOT DETECTED NOT DETECTED Final   Bacteroides fragilis NOT DETECTED NOT DETECTED Final   Enterobacterales DETECTED (A) NOT DETECTED Final    Comment: Enterobacterales represent a large order of gram negative bacteria, not a  single organism. CRITICAL RESULT CALLED TO, READ BACK BY AND VERIFIED WITH: JASON ROBBINS AT 0540 09/28/23.PMF    Enterobacter cloacae complex NOT DETECTED NOT DETECTED Final   Escherichia coli DETECTED (A) NOT DETECTED Final    Comment: CRITICAL RESULT CALLED TO, READ BACK BY AND VERIFIED WITH: JASON ROBBINS AT 0540 09/28/23.PMF    Klebsiella aerogenes NOT DETECTED NOT DETECTED Final   Klebsiella oxytoca NOT DETECTED NOT DETECTED Final   Klebsiella pneumoniae NOT DETECTED NOT DETECTED Final   Proteus species NOT DETECTED NOT DETECTED Final   Salmonella species NOT DETECTED NOT DETECTED Final   Serratia marcescens NOT DETECTED NOT DETECTED Final   Haemophilus influenzae NOT DETECTED NOT DETECTED Final   Neisseria meningitidis NOT DETECTED NOT DETECTED Final   Pseudomonas aeruginosa NOT DETECTED NOT DETECTED Final   Stenotrophomonas maltophilia NOT DETECTED NOT DETECTED Final   Candida albicans NOT DETECTED NOT DETECTED Final   Candida auris NOT DETECTED NOT DETECTED Final   Candida glabrata NOT DETECTED NOT DETECTED Final   Candida krusei NOT DETECTED NOT DETECTED Final   Candida parapsilosis NOT DETECTED NOT DETECTED Final   Candida tropicalis NOT DETECTED NOT DETECTED Final   Cryptococcus neoformans/gattii NOT DETECTED NOT DETECTED Final   CTX-M ESBL NOT DETECTED NOT DETECTED Final   Carbapenem resistance IMP NOT DETECTED NOT DETECTED Final   Carbapenem resistance KPC NOT DETECTED NOT DETECTED Final   Carbapenem resistance NDM NOT DETECTED NOT DETECTED Final   Carbapenem resist OXA 48 LIKE NOT DETECTED NOT DETECTED Final   Carbapenem resistance VIM NOT DETECTED NOT DETECTED Final    Comment: Performed at [facility], [address] [place], [place] [address]  Blood Culture (routine x 2)     Status: Abnormal   Collection Time: 09/27/23 12:33 PM   Specimen: BLOOD RIGHT ARM  Result Value Ref Range Status   Specimen Description   Final    BLOOD RIGHT ARM Performed at  [facility], [address] [place], [place] [address]    Special Requests   Final    BOTTLES DRAWN AEROBIC AND ANAEROBIC Blood Culture results may not be optimal due to an inadequate volume of blood received in culture bottles Performed at [facility], [address], [place], [place] [address]    Culture  Setup Time  Final    GRAM NEGATIVE RODS AEROBIC BOTTLE ONLY CRITICAL VALUE NOTED.  VALUE IS CONSISTENT WITH PREVIOUSLY REPORTED AND CALLED VALUE.    Culture (A)  Final    ESCHERICHIA COLI SUSCEPTIBILITIES PERFORMED ON PREVIOUS CULTURE WITHIN THE LAST 5 DAYS. Performed at [facility], [address] [place], [place] [address]    Report Status 09/30/2023 FINAL  Final  Culture, blood (Routine X 2) w Reflex to ID Panel     Status: None (Preliminary result)   Collection Time: 09/29/23  5:41 PM   Specimen: BLOOD  Result Value Ref Range Status   Specimen Description BLOOD LAC  Final   Special Requests   Final    BOTTLES DRAWN AEROBIC AND ANAEROBIC Blood Culture results may not be optimal due to an inadequate volume of blood received in culture bottles   Culture   Final    NO GROWTH < 24 HOURS Performed at [facility], [address] [place], [place] [address]    Report Status PENDING  Incomplete  Culture, blood (Routine X 2) w Reflex to ID Panel     Status: None (Preliminary result)   Collection Time: 09/29/23  5:41 PM   Specimen: BLOOD  Result Value Ref Range Status   Specimen Description BLOOD BLOOD LEFT ARM  Final   Special Requests   Final    BOTTLES DRAWN AEROBIC AND ANAEROBIC Blood Culture adequate volume   Culture   Final    NO GROWTH < 24 HOURS Performed at [facility], [address] [place], [place] [address]    Report Status PENDING  Incomplete    Labs: CBC: Recent Labs  Lab 09/27/23 1227 09/28/23 0430 09/29/23 1148 09/30/23 0430  WBC 16.0* 10.3 9.5 8.9  NEUTROABS 13.6*  --  7.8* 6.3  HGB 12.8 10.9*  11.2* 10.9*  HCT 38.3 32.7* 32.9* 31.3*  MCV 88.9 88.9 84.4 84.1  PLT 133* 120* 153 157   Basic Metabolic Panel: Recent Labs  Lab 09/27/23 1227 09/28/23 0430 09/29/23 1148 09/30/23 0430  NA 126* 126* 134* 136  K 4.6 3.8 3.8 3.5  CL 95* 100 104 107  CO2 17* 15* 17* 20*  GLUCOSE 115* 105* 112* 97  BUN 55* 52* 42* 34*  CREATININE 4.38* 3.73* 2.86* 2.40*  CALCIUM 8.7* 8.1* 8.2* 7.9*   Liver Function Tests: Recent Labs  Lab 09/27/23 1227 09/28/23 0430  AST 111* 87*  ALT 39 37  ALKPHOS 82 62  BILITOT 1.2* 0.9  PROT 7.2 5.9*  ALBUMIN 3.8 3.0*   CBG: No results for input(s): "GLUCAP" in the last 168 hours.  Discharge time spent: greater than 30 minutes.  Signed: Jonah Blue, MD [facility] 09/30/2023

## 2023-10-04 LAB — CULTURE, BLOOD (ROUTINE X 2)
Culture: NO GROWTH
Culture: NO GROWTH
Special Requests: ADEQUATE
Special Requests: ADEQUATE

## 2023-10-05 DIAGNOSIS — F419 Anxiety disorder, unspecified: Secondary | ICD-10-CM | POA: Diagnosis not present

## 2023-10-05 DIAGNOSIS — E669 Obesity, unspecified: Secondary | ICD-10-CM | POA: Diagnosis not present

## 2023-10-05 DIAGNOSIS — Z87891 Personal history of nicotine dependence: Secondary | ICD-10-CM | POA: Diagnosis not present

## 2023-10-05 DIAGNOSIS — N1832 Chronic kidney disease, stage 3b: Secondary | ICD-10-CM | POA: Diagnosis not present

## 2023-10-05 DIAGNOSIS — E785 Hyperlipidemia, unspecified: Secondary | ICD-10-CM | POA: Diagnosis not present

## 2023-10-05 DIAGNOSIS — N179 Acute kidney failure, unspecified: Secondary | ICD-10-CM | POA: Diagnosis not present

## 2023-10-05 DIAGNOSIS — I1310 Hypertensive heart and chronic kidney disease without heart failure, with stage 1 through stage 4 chronic kidney disease, or unspecified chronic kidney disease: Secondary | ICD-10-CM | POA: Diagnosis not present

## 2023-10-05 DIAGNOSIS — G8929 Other chronic pain: Secondary | ICD-10-CM | POA: Diagnosis not present

## 2023-10-05 DIAGNOSIS — N390 Urinary tract infection, site not specified: Secondary | ICD-10-CM | POA: Diagnosis not present

## 2023-10-05 DIAGNOSIS — F32A Depression, unspecified: Secondary | ICD-10-CM | POA: Diagnosis not present

## 2023-10-05 DIAGNOSIS — A4151 Sepsis due to Escherichia coli [E. coli]: Secondary | ICD-10-CM | POA: Diagnosis not present

## 2023-10-05 DIAGNOSIS — Z79899 Other long term (current) drug therapy: Secondary | ICD-10-CM | POA: Diagnosis not present

## 2023-10-05 DIAGNOSIS — Z6831 Body mass index (BMI) 31.0-31.9, adult: Secondary | ICD-10-CM | POA: Diagnosis not present

## 2023-10-05 DIAGNOSIS — G9341 Metabolic encephalopathy: Secondary | ICD-10-CM | POA: Diagnosis not present

## 2023-10-05 DIAGNOSIS — Z85828 Personal history of other malignant neoplasm of skin: Secondary | ICD-10-CM | POA: Diagnosis not present

## 2023-10-05 DIAGNOSIS — Z9181 History of falling: Secondary | ICD-10-CM | POA: Diagnosis not present

## 2023-10-05 DIAGNOSIS — I21A1 Myocardial infarction type 2: Secondary | ICD-10-CM | POA: Diagnosis not present

## 2023-10-05 DIAGNOSIS — I700 Atherosclerosis of aorta: Secondary | ICD-10-CM | POA: Diagnosis not present

## 2023-10-05 DIAGNOSIS — K5730 Diverticulosis of large intestine without perforation or abscess without bleeding: Secondary | ICD-10-CM | POA: Diagnosis not present

## 2023-10-07 DIAGNOSIS — N12 Tubulo-interstitial nephritis, not specified as acute or chronic: Secondary | ICD-10-CM | POA: Diagnosis not present

## 2023-10-07 DIAGNOSIS — E871 Hypo-osmolality and hyponatremia: Secondary | ICD-10-CM | POA: Diagnosis not present

## 2023-10-07 DIAGNOSIS — N179 Acute kidney failure, unspecified: Secondary | ICD-10-CM | POA: Diagnosis not present

## 2023-10-07 DIAGNOSIS — A419 Sepsis, unspecified organism: Secondary | ICD-10-CM | POA: Diagnosis not present

## 2023-10-07 DIAGNOSIS — Z09 Encounter for follow-up examination after completed treatment for conditions other than malignant neoplasm: Secondary | ICD-10-CM | POA: Diagnosis not present

## 2023-10-20 DIAGNOSIS — N390 Urinary tract infection, site not specified: Secondary | ICD-10-CM | POA: Diagnosis not present

## 2023-10-20 DIAGNOSIS — E785 Hyperlipidemia, unspecified: Secondary | ICD-10-CM | POA: Diagnosis not present

## 2023-10-20 DIAGNOSIS — A4151 Sepsis due to Escherichia coli [E. coli]: Secondary | ICD-10-CM | POA: Diagnosis not present

## 2023-10-20 DIAGNOSIS — N1832 Chronic kidney disease, stage 3b: Secondary | ICD-10-CM | POA: Diagnosis not present

## 2023-10-20 DIAGNOSIS — G9341 Metabolic encephalopathy: Secondary | ICD-10-CM | POA: Diagnosis not present

## 2023-10-20 DIAGNOSIS — I1310 Hypertensive heart and chronic kidney disease without heart failure, with stage 1 through stage 4 chronic kidney disease, or unspecified chronic kidney disease: Secondary | ICD-10-CM | POA: Diagnosis not present

## 2023-10-20 DIAGNOSIS — F419 Anxiety disorder, unspecified: Secondary | ICD-10-CM | POA: Diagnosis not present

## 2023-10-21 DIAGNOSIS — N1832 Chronic kidney disease, stage 3b: Secondary | ICD-10-CM | POA: Diagnosis not present

## 2023-10-21 DIAGNOSIS — G9341 Metabolic encephalopathy: Secondary | ICD-10-CM | POA: Diagnosis not present

## 2023-10-21 DIAGNOSIS — A4151 Sepsis due to Escherichia coli [E. coli]: Secondary | ICD-10-CM | POA: Diagnosis not present

## 2023-10-21 DIAGNOSIS — F419 Anxiety disorder, unspecified: Secondary | ICD-10-CM | POA: Diagnosis not present

## 2023-10-21 DIAGNOSIS — N390 Urinary tract infection, site not specified: Secondary | ICD-10-CM | POA: Diagnosis not present

## 2023-10-21 DIAGNOSIS — I1310 Hypertensive heart and chronic kidney disease without heart failure, with stage 1 through stage 4 chronic kidney disease, or unspecified chronic kidney disease: Secondary | ICD-10-CM | POA: Diagnosis not present

## 2023-10-21 DIAGNOSIS — E785 Hyperlipidemia, unspecified: Secondary | ICD-10-CM | POA: Diagnosis not present

## 2023-10-23 DIAGNOSIS — R030 Elevated blood-pressure reading, without diagnosis of hypertension: Secondary | ICD-10-CM | POA: Diagnosis not present

## 2023-11-12 DIAGNOSIS — L853 Xerosis cutis: Secondary | ICD-10-CM | POA: Diagnosis not present

## 2023-11-12 DIAGNOSIS — D3701 Neoplasm of uncertain behavior of lip: Secondary | ICD-10-CM | POA: Diagnosis not present

## 2023-11-12 DIAGNOSIS — D040 Carcinoma in situ of skin of lip: Secondary | ICD-10-CM | POA: Diagnosis not present

## 2023-11-12 DIAGNOSIS — C44612 Basal cell carcinoma of skin of right upper limb, including shoulder: Secondary | ICD-10-CM | POA: Diagnosis not present

## 2023-11-12 DIAGNOSIS — L578 Other skin changes due to chronic exposure to nonionizing radiation: Secondary | ICD-10-CM | POA: Diagnosis not present

## 2023-11-12 DIAGNOSIS — D485 Neoplasm of uncertain behavior of skin: Secondary | ICD-10-CM | POA: Diagnosis not present

## 2023-11-12 DIAGNOSIS — X32XXXA Exposure to sunlight, initial encounter: Secondary | ICD-10-CM | POA: Diagnosis not present

## 2023-11-12 DIAGNOSIS — Z08 Encounter for follow-up examination after completed treatment for malignant neoplasm: Secondary | ICD-10-CM | POA: Diagnosis not present

## 2023-11-12 DIAGNOSIS — Z85828 Personal history of other malignant neoplasm of skin: Secondary | ICD-10-CM | POA: Diagnosis not present

## 2023-11-12 DIAGNOSIS — L570 Actinic keratosis: Secondary | ICD-10-CM | POA: Diagnosis not present

## 2023-11-27 DIAGNOSIS — C44612 Basal cell carcinoma of skin of right upper limb, including shoulder: Secondary | ICD-10-CM | POA: Diagnosis not present

## 2023-11-27 DIAGNOSIS — D040 Carcinoma in situ of skin of lip: Secondary | ICD-10-CM | POA: Diagnosis not present

## 2023-11-27 DIAGNOSIS — L570 Actinic keratosis: Secondary | ICD-10-CM | POA: Diagnosis not present

## 2023-12-16 DIAGNOSIS — N1832 Chronic kidney disease, stage 3b: Secondary | ICD-10-CM | POA: Diagnosis not present

## 2023-12-16 DIAGNOSIS — Z0000 Encounter for general adult medical examination without abnormal findings: Secondary | ICD-10-CM | POA: Diagnosis not present

## 2023-12-16 DIAGNOSIS — Z79899 Other long term (current) drug therapy: Secondary | ICD-10-CM | POA: Diagnosis not present

## 2023-12-16 DIAGNOSIS — E7800 Pure hypercholesterolemia, unspecified: Secondary | ICD-10-CM | POA: Diagnosis not present

## 2023-12-16 DIAGNOSIS — R0602 Shortness of breath: Secondary | ICD-10-CM | POA: Diagnosis not present

## 2023-12-16 DIAGNOSIS — G894 Chronic pain syndrome: Secondary | ICD-10-CM | POA: Diagnosis not present

## 2023-12-16 DIAGNOSIS — F3341 Major depressive disorder, recurrent, in partial remission: Secondary | ICD-10-CM | POA: Diagnosis not present

## 2023-12-16 DIAGNOSIS — Z1231 Encounter for screening mammogram for malignant neoplasm of breast: Secondary | ICD-10-CM | POA: Diagnosis not present

## 2023-12-16 DIAGNOSIS — N184 Chronic kidney disease, stage 4 (severe): Secondary | ICD-10-CM | POA: Diagnosis not present

## 2023-12-16 DIAGNOSIS — J431 Panlobular emphysema: Secondary | ICD-10-CM | POA: Diagnosis not present

## 2023-12-16 DIAGNOSIS — F339 Major depressive disorder, recurrent, unspecified: Secondary | ICD-10-CM | POA: Diagnosis not present

## 2023-12-16 DIAGNOSIS — R7303 Prediabetes: Secondary | ICD-10-CM | POA: Diagnosis not present

## 2023-12-18 ENCOUNTER — Other Ambulatory Visit: Payer: Self-pay | Admitting: Internal Medicine

## 2023-12-18 DIAGNOSIS — Z1231 Encounter for screening mammogram for malignant neoplasm of breast: Secondary | ICD-10-CM

## 2023-12-31 DIAGNOSIS — R899 Unspecified abnormal finding in specimens from other organs, systems and tissues: Secondary | ICD-10-CM | POA: Diagnosis not present

## 2024-02-10 ENCOUNTER — Encounter

## 2024-02-11 ENCOUNTER — Ambulatory Visit
Admission: RE | Admit: 2024-02-11 | Discharge: 2024-02-11 | Disposition: A | Discharge status: Home / Self Care | Source: Ambulatory Visit | Attending: Internal Medicine | Admitting: Internal Medicine

## 2024-02-11 DIAGNOSIS — Z1231 Encounter for screening mammogram for malignant neoplasm of breast: Secondary | ICD-10-CM | POA: Insufficient documentation

## 2024-03-16 DIAGNOSIS — M5137 Other intervertebral disc degeneration, lumbosacral region with discogenic back pain only: Secondary | ICD-10-CM | POA: Diagnosis not present

## 2024-03-16 DIAGNOSIS — N1832 Chronic kidney disease, stage 3b: Secondary | ICD-10-CM | POA: Diagnosis not present

## 2024-03-16 DIAGNOSIS — R7303 Prediabetes: Secondary | ICD-10-CM | POA: Diagnosis not present

## 2024-03-16 DIAGNOSIS — G894 Chronic pain syndrome: Secondary | ICD-10-CM | POA: Diagnosis not present

## 2024-03-16 DIAGNOSIS — Z79899 Other long term (current) drug therapy: Secondary | ICD-10-CM | POA: Diagnosis not present

## 2024-03-16 DIAGNOSIS — F339 Major depressive disorder, recurrent, unspecified: Secondary | ICD-10-CM | POA: Diagnosis not present

## 2024-03-16 DIAGNOSIS — F3341 Major depressive disorder, recurrent, in partial remission: Secondary | ICD-10-CM | POA: Diagnosis not present

## 2024-03-16 DIAGNOSIS — J431 Panlobular emphysema: Secondary | ICD-10-CM | POA: Diagnosis not present

## 2024-03-16 DIAGNOSIS — E7800 Pure hypercholesterolemia, unspecified: Secondary | ICD-10-CM | POA: Diagnosis not present

## 2024-06-17 DIAGNOSIS — N1832 Chronic kidney disease, stage 3b: Secondary | ICD-10-CM | POA: Diagnosis not present

## 2024-06-17 DIAGNOSIS — G894 Chronic pain syndrome: Secondary | ICD-10-CM | POA: Diagnosis not present

## 2024-06-17 DIAGNOSIS — R2689 Other abnormalities of gait and mobility: Secondary | ICD-10-CM | POA: Diagnosis not present

## 2024-06-17 DIAGNOSIS — F3341 Major depressive disorder, recurrent, in partial remission: Secondary | ICD-10-CM | POA: Diagnosis not present

## 2024-06-17 DIAGNOSIS — R7303 Prediabetes: Secondary | ICD-10-CM | POA: Diagnosis not present

## 2024-06-17 DIAGNOSIS — Z79899 Other long term (current) drug therapy: Secondary | ICD-10-CM | POA: Diagnosis not present

## 2024-06-17 DIAGNOSIS — F339 Major depressive disorder, recurrent, unspecified: Secondary | ICD-10-CM | POA: Diagnosis not present

## 2024-06-17 DIAGNOSIS — J431 Panlobular emphysema: Secondary | ICD-10-CM | POA: Diagnosis not present

## 2024-06-17 DIAGNOSIS — E7800 Pure hypercholesterolemia, unspecified: Secondary | ICD-10-CM | POA: Diagnosis not present

## 2024-06-29 ENCOUNTER — Ambulatory Visit: Attending: Internal Medicine | Admitting: Physical Therapy

## 2024-06-29 DIAGNOSIS — M6281 Muscle weakness (generalized): Secondary | ICD-10-CM | POA: Insufficient documentation

## 2024-06-29 DIAGNOSIS — R262 Difficulty in walking, not elsewhere classified: Secondary | ICD-10-CM | POA: Insufficient documentation

## 2024-06-29 DIAGNOSIS — R2681 Unsteadiness on feet: Secondary | ICD-10-CM | POA: Diagnosis not present

## 2024-06-29 DIAGNOSIS — R269 Unspecified abnormalities of gait and mobility: Secondary | ICD-10-CM | POA: Diagnosis not present

## 2024-06-29 NOTE — Therapy (Signed)
 OUTPATIENT PHYSICAL THERAPY NEURO EVALUATION   Patient Name: Brenda Wiley MRN: [id number] DOB:1941-01-05, 83 y.o., female Today's Date: 06/29/2024   PCP:   Auston Reyes BIRCH, MD   REFERRING PROVIDER:   Auston Reyes BIRCH, MD    END OF SESSION:  PT End of Session - 06/29/24 1349     Visit Number 1    Number of Visits 24    Date for PT Re-Evaluation 09/21/24    PT Start Time 1400    PT Stop Time 1442    PT Time Calculation (min) 42 min    Equipment Utilized During Treatment Gait belt    Activity Tolerance Patient tolerated treatment well    Behavior During Therapy [facility] for tasks assessed/performed          Past Medical History:  Diagnosis Date   Anal sphincter tear (healed) (old)-anterior 04/13/2014   Basal cell carcinoma    Chronic headaches    resolved   Depression    loss of spouse   Diverticulosis    Dizzy spells 02/23/2015   X 1year   HLD (hyperlipidemia)    HTN (hypertension) 09/27/2023   Past Surgical History:  Procedure Laterality Date   AUGMENTATION MAMMAPLASTY Bilateral    silicone implants put in over 30 yeras ago   CESAREAN SECTION     x 2   COLONOSCOPY     PARTIAL HYSTERECTOMY     TONSILLECTOMY     Patient Active Problem List   Diagnosis Date Noted   Demand ischemia (HCC) 09/28/2023   Anxiety disorder 09/28/2023   E coli bacteremia 09/28/2023   Sepsis secondary to UTI (HCC) 09/27/2023   UTI (urinary tract infection) 09/27/2023   NSTEMI (non-ST elevated myocardial infarction) (HCC) 09/27/2023   Acute kidney injury superimposed on chronic kidney disease (HCC) 09/27/2023   HTN (hypertension) 09/27/2023   Acute metabolic encephalopathy 09/27/2023   Hyponatremia 09/27/2023   Shortness of breath 02/17/2015   Smoker 02/17/2015   Hyperlipidemia 02/17/2015   Osteoarthritis of both hips 02/17/2015   Fecal incontinence due to anorectal disorder 04/13/2014   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 04/13/2014    ONSET DATE: 3 months approx  REFERRING DIAG:  R26.89 (ICD-10-CM) - Balance disorder   THERAPY DIAG:  Abnormality of gait and mobility  Difficulty in walking, not elsewhere classified  Unsteadiness on feet  Muscle weakness (generalized)  Rationale for Evaluation and Treatment: Rehabilitation  SUBJECTIVE:  SUBJECTIVE STATEMENT: Pt reports with her balance it is hard to explain. Pt has a lot of stumbles when she is turning around and is not very sure footed. Pt reports this has been going on for several months. Pt has not had any falls in the last 6 months but several stumbles. Pt reports it started with a UTI and it got very bad during this. Since then there has been a noticeable difference in her balance, speech and her over all and has had some difficulty with word finding. Pt has been doing a lot less and feels she needs to do more things.  Patient also reports she has a fear of bending down to pick things up at times although she is able to do it in the clinic she reports the other day she was too afraid to bend down to pick something up she needed off the ground and had to ask for assistance.  Edition patient feels she is unable to go up and down a full flight of steps and has absolute certainty of falling with here in her opinion. Pt accompanied by: self  PERTINENT HISTORY: HTN, HLD  From eval: Pt reports with her balance it is hard to explain. Pt has a lot of stumbles when she is turning around and is not very sure footed. Pt reports this has been going on for several months. Pt has not had any falls in the last 6 months but several stumbles. Pt reports it started with a UTI and it got very bad during this. Since then there has been a noticeable difference in her balance, speech and her over all and has had some difficulty with word finding. Pt has been  doing a lot less and feels she needs to do more things.  Patient also reports she has a fear of bending down to pick things up at times although she is able to do it in the clinic she reports the other day she was too afraid to bend down to pick something up she needed off the ground and had to ask for assistance.  Edition patient feels she is unable to go up and down a full flight of steps and has absolute certainty of falling with here in her opinion. Pt accompanied by: self PAIN:  Are you having pain? No  PRECAUTIONS: Fall  RED FLAGS: None   WEIGHT BEARING RESTRICTIONS: No  FALLS: Has patient fallen in last 6 months? No and several stumbles   LIVING ENVIRONMENT: Lives with: lives with their family and and has a dog  Lives in: House/apartment Stairs: Yes: External: 2 steps; can reach both Has following equipment at home: None  PLOF: Independent and Independent with basic ADLs  PATIENT GOALS: Improve her overall mobility and safety with balance   OBJECTIVE:  Note: Objective measures were completed at Evaluation unless otherwise noted.  DIAGNOSTIC FINDINGS: n/a  COGNITION: Overall cognitive status: pt reports some memorty and word finding impairment since UTI   SENSATION: Not tested    LOWER EXTREMITY ROM:     WFL for tasks assessed   LOWER EXTREMITY MMT:    MMT Right Eval Left Eval  Hip flexion 4+ 4+  Hip extension    Hip abduction 4+ 4+  Hip adduction 4+ 4+  Hip internal rotation    Hip external rotation    Knee flexion 4+ 4+  Knee extension 4+ 4+  Ankle dorsiflexion 4+ 4+  Ankle plantarflexion    Ankle inversion    Ankle  eversion    (Blank rows = not tested)   TRANSFERS: Sit to stand: Complete Independence  Assistive device utilized: None     Stand to sit: Complete Independence  Assistive device utilized: None     Chair to chair: Complete Independence  Assistive device utilized: None       RAMP:  Not tested  CURB:  Not  tested  STAIRS: Findings: Level of Assistance: Modified independence, Stair Negotiation Technique: Alternating Pattern  with Bilateral Rails, Number of Stairs: 4, Height of Stairs: 6 in   , and Comments: Is afraid of doing a flight of steps, feels as though she would fall.  GAIT: Findings: Distance walked: 50 ft and Comments: NBOS, unsteady at times   FUNCTIONAL TESTS:  5 times sit to stand: 15.69 sec  10 meter walk test: 12.99 sec = .76 m/s : 265 ft, mild imbalance at times, no AD  Dynamic Gait Index: Test visit 2     PATIENT SURVEYS:  ABC scale: The Activities-Specific Balance Confidence (ABC) Scale 0% 10 20 30  40 50 60 70 80 90 100% No confidence<->completely confident  "How confident are you that you will not lose your balance or become unsteady when you . . .   Date tested 06/29/24  Walk around the house 100%  2. Walk up or down stairs 100%  3. Bend over and pick up a slipper from in front of a closet floor 80%  4. Reach for a small can off a shelf at eye level 100%  5. Stand on tip toes and reach for something above your head 90%  6. Stand on a chair and reach for something 0%  7. Sweep the floor 100%  8. Walk outside the house to a car parked in the driveway 100%  9. Get into or out of a car 100%  10. Walk across a parking lot to the mall 70%  11. Walk up or down a ramp 80%  12. Walk in a crowded mall where people rapidly walk past you 100%  13. Are bumped into by people as you walk through the mall 80%  14. Step onto or off of an escalator while you are holding onto the railing 100%  15. Step onto or off an escalator while holding onto parcels such that you cannot hold onto the railing 100%  16. Walk outside on icy sidewalks 0%  Total: #/16 81.25%                                                                                                                                 TREATMENT DATE: 06/29/24   SELF CARE Patient instructed in plan of care, findings for  evaluation, and ways of physical therapy may improve their function and quality of life.     PATIENT EDUCATION: Education details: POC Person educated: Patient Education method: Explanation Education comprehension: verbalized understanding   HOME EXERCISE PROGRAM: Establish visit 2  GOALS: Goals reviewed with patient? No  SHORT TERM GOALS: Target date: 07/27/2024      Patient will be independent in home exercise program to improve strength/mobility for better functional independence with ADLs. Baseline: No HEP currently  Goal status: INITIAL  LONG TERM GOALS: Target date: 09/21/2024   1.  Patient will complete five times sit to stand test in < 13 seconds indicating an increased LE strength and improved balance. Baseline: 15.69 sec Goal status: INITIAL  2.  Patient will improve ABC scale score to 90%   to demonstrate statistically significant improvement in mobility and quality of life as it relates to their balance confidence.  Baseline: 81.25% Goal status: INITIAL   3.  Patient will increase Berg Balance score by > 6 points to demonstrate decreased fall risk during functional activities. Baseline: 46 Goal status: INITIAL   4.   Patient will improve DGI score by 4 points or greater to reduce fall risk and demonstrate improved dynamic balance. Baseline: test visit 2  Goal status: INITIAL  5.   Patient will increase 10 meter walk test to >1.19m/s as to improve gait speed for better community ambulation and to reduce fall risk. Baseline: .76 m/s Goal status: INITIAL  6.   Patient will increase 2 minute walk test distance to >400 ft for progression to community ambulator and improve gait ability Baseline: 265 ft Goal status: INITIAL   ASSESSMENT:  CLINICAL IMPRESSION: Patient is a 83 year old female who is to physical therapy for issues regarding balance and mobility.  Patient presents with self-reported decrease in balance comments particularly with bending  down to pick things up on the sidewalk as well as going up and down stairs and general mobility in the community.  Patient's activity specific balance scale does not sincerely reflect this the patient subjectively does report these things to physical therapist and voices concern about it.  Patient does demonstrate balance impairments as evidenced by Lars balance scale as well as 10 m walk test.  Both of these putting at her increased risk of falls.  Patient also shows decreased endurance requiring seated rest break following 10 m walk test and reporting some fatigue.  Patient is able to complete 2-minute walk test without significant fatigue but is below age-matched norms so we will be a good goal to track in the future to continue to track her endurance.  Patient will benefit physical physical therapy to address her deficits and improve her quality of life.  OBJECTIVE IMPAIRMENTS: Abnormal gait, decreased activity tolerance, decreased balance, decreased endurance, difficulty walking, and decreased strength.   ACTIVITY LIMITATIONS: standing, stairs, and locomotion level  PARTICIPATION LIMITATIONS: laundry, shopping, community activity, and yard work  PERSONAL FACTORS: Age and 1-2 comorbidities: HLD, HTN are also affecting patient's functional outcome.   REHAB POTENTIAL: Good  CLINICAL DECISION MAKING: Evolving/moderate complexity  EVALUATION COMPLEXITY: Moderate  PLAN:  PT FREQUENCY: 2x/week  PT DURATION: 12 weeks  PLANNED INTERVENTIONS: 97750- Physical Performance Testing, 97110-Therapeutic exercises, 97530- Therapeutic activity, [id number]- Neuromuscular re-education, 97535- Self Care, [id number]- Manual therapy, [phone]- Gait training, Balance training, Stair training, DME instructions, and Moist heat  PLAN FOR NEXT SESSION: HEP, DGI   Lonni KATHEE Gainer, PT 06/29/2024, 2:09 PM

## 2024-07-02 DIAGNOSIS — Z79899 Other long term (current) drug therapy: Secondary | ICD-10-CM | POA: Diagnosis not present

## 2024-07-06 ENCOUNTER — Ambulatory Visit: Admitting: Physical Therapy

## 2024-07-06 DIAGNOSIS — R269 Unspecified abnormalities of gait and mobility: Secondary | ICD-10-CM | POA: Diagnosis not present

## 2024-07-06 DIAGNOSIS — M6281 Muscle weakness (generalized): Secondary | ICD-10-CM

## 2024-07-06 DIAGNOSIS — R2681 Unsteadiness on feet: Secondary | ICD-10-CM

## 2024-07-06 DIAGNOSIS — R262 Difficulty in walking, not elsewhere classified: Secondary | ICD-10-CM

## 2024-07-06 NOTE — Therapy (Signed)
 OUTPATIENT PHYSICAL THERAPY NEURO TREATMENT   Patient Name: Reyanna Baley MRN: [id number] DOB:05/25/1941, 83 y.o., female Today's Date: 07/06/2024   PCP:   Auston Reyes BIRCH, MD   REFERRING PROVIDER:   Auston Reyes BIRCH, MD    END OF SESSION:  PT End of Session - 07/06/24 1405     Visit Number 2    Number of Visits 24    Date for PT Re-Evaluation 09/21/24    PT Start Time 1405    PT Stop Time 1446    PT Time Calculation (min) 41 min    Equipment Utilized During Treatment Gait belt    Activity Tolerance Patient tolerated treatment well    Behavior During Therapy [facility] for tasks assessed/performed           Past Medical History:  Diagnosis Date   Anal sphincter tear (healed) (old)-anterior 04/13/2014   Basal cell carcinoma    Chronic headaches    resolved   Depression    loss of spouse   Diverticulosis    Dizzy spells 02/23/2015   X 1year   HLD (hyperlipidemia)    HTN (hypertension) 09/27/2023   Past Surgical History:  Procedure Laterality Date   AUGMENTATION MAMMAPLASTY Bilateral    silicone implants put in over 30 yeras ago   CESAREAN SECTION     x 2   COLONOSCOPY     PARTIAL HYSTERECTOMY     TONSILLECTOMY     Patient Active Problem List   Diagnosis Date Noted   Demand ischemia (HCC) 09/28/2023   Anxiety disorder 09/28/2023   E coli bacteremia 09/28/2023   Sepsis secondary to UTI (HCC) 09/27/2023   UTI (urinary tract infection) 09/27/2023   NSTEMI (non-ST elevated myocardial infarction) (HCC) 09/27/2023   Acute kidney injury superimposed on chronic kidney disease (HCC) 09/27/2023   HTN (hypertension) 09/27/2023   Acute metabolic encephalopathy 09/27/2023   Hyponatremia 09/27/2023   Shortness of breath 02/17/2015   Smoker 02/17/2015   Hyperlipidemia 02/17/2015   Osteoarthritis of both hips 02/17/2015   Fecal incontinence due to anorectal disorder 04/13/2014   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 04/13/2014    ONSET DATE: 3 months approx  REFERRING DIAG:  R26.89 (ICD-10-CM) - Balance disorder   THERAPY DIAG:  Abnormality of gait and mobility  Difficulty in walking, not elsewhere classified  Unsteadiness on feet  Muscle weakness (generalized)  Rationale for Evaluation and Treatment: Rehabilitation  SUBJECTIVE:  SUBJECTIVE STATEMENT:  Pt initially pointing at printed therapy schedule calendar while apologizing for missing the appointment last week. Anticipate pt having difficulty reading the calendar layout because once questioned, she did remember meeting prior therapist. Pt states I could not move all night long I ached so after last session. Pt states she didn't understand why that happened because she didn't feel like she did much during therapy. Pt states she did go to [place] after her last session and that may have contributed to her aching because she isn't used to doing that either. Denies pain today. Denies stumbles/falls.  Pt accompanied by: self  PERTINENT HISTORY: HTN, HLD  From eval: Pt reports with her balance it is hard to explain. Pt has a lot of stumbles when she is turning around and is not very sure footed. Pt reports this has been going on for several months. Pt has not had any falls in the last 6 months but several stumbles. Pt reports it started with a UTI and it got very bad during this. Since then there has been a noticeable difference in her balance, speech and her over all and has had some difficulty with word finding. Pt has been doing a lot less and feels she needs to do more things.  Patient also reports she has a fear of bending down to pick things up at times although she is able to do it in the clinic she reports the other day she was too afraid to bend down to pick something up she needed off the ground and had to ask for  assistance.  Edition patient feels she is unable to go up and down a full flight of steps and has absolute certainty of falling with here in her opinion.  PAIN:  Are you having pain? No  PRECAUTIONS: Fall  RED FLAGS: None   WEIGHT BEARING RESTRICTIONS: No  FALLS: Has patient fallen in last 6 months? No and several stumbles   LIVING ENVIRONMENT: Lives with: lives with their family and and has a dog  Lives in: House/apartment Stairs: Yes: External: 2 steps; can reach both Has following equipment at home: None  PLOF: Independent and Independent with basic ADLs  PATIENT GOALS: Improve her overall mobility and safety with balance   OBJECTIVE:  Note: Objective measures were completed at Evaluation unless otherwise noted.  DIAGNOSTIC FINDINGS: n/a  COGNITION: Overall cognitive status: pt reports some memory and word finding impairment since UTI   SENSATION: Not tested    LOWER EXTREMITY ROM:     WFL for tasks assessed   LOWER EXTREMITY MMT:    MMT Right Eval Left Eval  Hip flexion 4+ 4+  Hip extension    Hip abduction 4+ 4+  Hip adduction 4+ 4+  Hip internal rotation    Hip external rotation    Knee flexion 4+ 4+  Knee extension 4+ 4+  Ankle dorsiflexion 4+ 4+  Ankle plantarflexion    Ankle inversion    Ankle eversion    (Blank rows = not tested)   TRANSFERS: Sit to stand: Complete Independence  Assistive device utilized: None     Stand to sit: Complete Independence  Assistive device utilized: None     Chair to chair: Complete Independence  Assistive device utilized: None       RAMP:  Not tested  CURB:  Not tested  STAIRS: Findings: Level of Assistance: Modified independence, Stair Negotiation Technique: Alternating Pattern  with Bilateral Rails, Number of Stairs: 4, Height of Stairs:  6 in   , and Comments: Is afraid of doing a flight of steps, feels as though she would fall.  GAIT: Findings: Distance walked: 50 ft and Comments: NBOS, unsteady at  times   FUNCTIONAL TESTS:  5 times sit to stand: 15.69 sec  10 meter walk test: 12.99 sec = .76 m/s : 265 ft, mild imbalance at times, no AD  Dynamic Gait Index: Test visit 2   [facility] PT Assessment - 07/06/24 0001       Dynamic Gait Index   Level Surface Mild Impairment    Change in Gait Speed Moderate Impairment    Gait with Horizontal Head Turns Moderate Impairment    Gait with Vertical Head Turns Mild Impairment    Gait and Pivot Turn Moderate Impairment    Step Over Obstacle Mild Impairment    Step Around Obstacles Mild Impairment    Steps Mild Impairment    Total Score 13           PATIENT SURVEYS:  ABC scale: The Activities-Specific Balance Confidence (ABC) Scale 0% 10 20 30  40 50 60 70 80 90 100% No confidence<->completely confident  "How confident are you that you will not lose your balance or become unsteady when you . . .   Date tested 06/29/24  Walk around the house 100%  2. Walk up or down stairs 100%  3. Bend over and pick up a slipper from in front of a closet floor 80%  4. Reach for a small can off a shelf at eye level 100%  5. Stand on tip toes and reach for something above your head 90%  6. Stand on a chair and reach for something 0%  7. Sweep the floor 100%  8. Walk outside the house to a car parked in the driveway 100%  9. Get into or out of a car 100%  10. Walk across a parking lot to the mall 70%  11. Walk up or down a ramp 80%  12. Walk in a crowded mall where people rapidly walk past you 100%  13. Are bumped into by people as you walk through the mall 80%  14. Step onto or off of an escalator while you are holding onto the railing 100%  15. Step onto or off an escalator while holding onto parcels such that you cannot hold onto the railing 100%  16. Walk outside on icy sidewalks 0%  Total: #/16 81.25%                                                                                                                                 TREATMENT DATE:  07/06/24   Unless otherwise stated, CGA was provided and gait belt donned in order to ensure pt safety throughout session.  Patient participated in Dynamic Gait Index (DGI) and demonstrates increased fall risk as noted by score of 13/24. (< 19/24 = predictive of falls risk in community  dwelling elderly). Patient with greatest impairments when performing head turns (horizontal > vertical), change in gait speed, and pivot turn. Pt requires several small steps to maintain balance when turning.   [facility] Main PT Assessment - 07/06/24 0001       Dynamic Gait Index   Level Surface Mild Impairment    Change in Gait Speed Moderate Impairment    Gait with Horizontal Head Turns Moderate Impairment    Gait with Vertical Head Turns Mild Impairment    Gait and Pivot Turn Moderate Impairment    Step Over Obstacle Mild Impairment    Step Around Obstacles Mild Impairment    Steps Mild Impairment    Total Score 13         Patient becomes SOB following completion of DGI, despite standing rest breaks throughout. SpO2 99%  During gait, pt demonstrates abnormal spine alignment with R shoulder elevated compared to L and decreased L foot clearance compared to R as well as decreased step lengths bilaterally and decreased trunk rotation and arm swing.   Performed the following in preparation to provide patient with HEP and gauge her response to exercise given increased soreness/ache after last session:  Sit<>stands without UE support Cuing to only tap hips down, NOT sit down fully each time, and pt reports this as more challenging and states she will do it at home, but states she would be bored if she did true sit<>stands because they are too easy X8 reps Standing marches at counter with B UE support X10 reps each LE Does well maintaining balance to safely perform at home, but pt reports she can feel this is working on her balance Standing NBOS Started with x5 reps horizontal head turns, but pt states this is too  easy Transitioned to x10reps vertical head nods and pt reports this as challenging, primarily because of minor posterior lean tendency when nodding head up Cuing/education to only nod head and not arch through entire body  *pt again noted to have mild SOB with limited activity during session  - difficulty getting consistent reading on dynamap with HR; however, appears it may be low/irregular on the reading  Provided HEP printout and reviewed with patient to ensure understanding and improve recall of proper form/technique at home due to concern of cognitive/memory recall. Pt states she has difficulty remember things and this is noticed during session.  PATIENT EDUCATION: Education details: POC Person educated: Patient Education method: Explanation Education comprehension: verbalized understanding   HOME EXERCISE PROGRAM: Access Code: [email or web address] URL: [email or web address] Date: 07/06/2024 Prepared by: Connell Kiss  Exercises - Sit to Stand with Hands on Knees  - 1 x daily - 7 x weekly - 2 sets - 5 reps - Standing March with Counter Support  - 1 x daily - 7 x weekly - 2 sets - 10 reps - Narrow Stance with Counter Support - Vertical Head Nods  - 1 x daily - 7 x weekly - 2 sets - 10 reps   GOALS: Goals reviewed with patient? No  SHORT TERM GOALS: Target date: 07/27/2024  Patient will be independent in home exercise program to improve strength/mobility for better functional independence with ADLs. Baseline: No HEP currently  Goal status: INITIAL  LONG TERM GOALS: Target date: 09/21/2024   1.  Patient will complete five times sit to stand test in < 13 seconds indicating an increased LE strength and improved balance. Baseline: 15.69 sec Goal status: INITIAL  2.  Patient will improve ABC scale score to 90%  to demonstrate statistically significant improvement in mobility and quality of life as it relates to their balance confidence.  Baseline: 81.25% Goal status:  INITIAL   3.  Patient will increase Berg Balance score by > 6 points to demonstrate decreased fall risk during functional activities. Baseline: 46 Goal status: INITIAL   4.   Patient will improve DGI score by 4 points or greater to reduce fall risk and demonstrate improved dynamic balance. Baseline: 07/06/24: 13/24 Goal status: INITIAL  5.   Patient will increase 10 meter walk test to >1.76m/s as to improve gait speed for better community ambulation and to reduce fall risk. Baseline: .76 m/s Goal status: INITIAL  6.   Patient will increase 2 minute walk test distance to >400 ft for progression to community ambulator and improve gait ability Baseline: 265 ft Goal status: INITIAL   ASSESSMENT:  CLINICAL IMPRESSION:  Patient is a 83 year old female who presents to physical therapy for issues regarding balance and mobility.  Therapy session focused on further assessment of patient's dynamic gait via DGI with pt demonstrating high fall risk by score of 13/24. Patient with greatest balance impairments noted during horizontal head turns, pivot turn, and change in gait speed. Pt noted to require multiple small steps to maintain balance when turning. Noted to have decreased activity tolerance, quickly becoming SOB with limited activity during session; however, SpO2 WNL and inconsistent HR reading on dynamap indicating potential irregular rhythm. Therapy session focused on initiation of simple HEP with exercises targeting B LE strength and balance to ensure pt successful at completing independently at home. Patient will benefit physical physical therapy to address her deficits and improve her quality of life.  OBJECTIVE IMPAIRMENTS: Abnormal gait, decreased activity tolerance, decreased balance, decreased endurance, difficulty walking, and decreased strength.   ACTIVITY LIMITATIONS: standing, stairs, and locomotion level  PARTICIPATION LIMITATIONS: laundry, shopping, community activity, and yard  work  PERSONAL FACTORS: Age and 1-2 comorbidities: HLD, HTN are also affecting patient's functional outcome.   REHAB POTENTIAL: Good  CLINICAL DECISION MAKING: Evolving/moderate complexity  EVALUATION COMPLEXITY: Moderate  PLAN:  PT FREQUENCY: 2x/week  PT DURATION: 12 weeks  PLANNED INTERVENTIONS: 97750- Physical Performance Testing, 97110-Therapeutic exercises, 97530- Therapeutic activity, 97112- Neuromuscular re-education, 97535- Self Care, [id number]- Manual therapy, [phone]- Gait training, Balance training, Stair training, DME instructions, and Moist heat  PLAN FOR NEXT SESSION: Follow-up on provided HEP  Ensure performing exercises correctly Dynamic gait including: Turning Horizontal head turns Change in gait speed Dynamic balance and B LE strengthening including: Step ups   Hilberto Burzynski, PT, DPT, NCS, CSRS Physical Therapist - [place]  [facility]  2:59 PM 07/06/24

## 2024-07-13 ENCOUNTER — Ambulatory Visit: Admitting: Physical Therapy

## 2024-07-13 DIAGNOSIS — R269 Unspecified abnormalities of gait and mobility: Secondary | ICD-10-CM

## 2024-07-13 DIAGNOSIS — M6281 Muscle weakness (generalized): Secondary | ICD-10-CM

## 2024-07-13 DIAGNOSIS — R2681 Unsteadiness on feet: Secondary | ICD-10-CM

## 2024-07-13 DIAGNOSIS — R262 Difficulty in walking, not elsewhere classified: Secondary | ICD-10-CM

## 2024-07-13 NOTE — Therapy (Deleted)
 OUTPATIENT PHYSICAL THERAPY NEURO TREATMENT   Patient Name: Brenda Wiley MRN: [id number] DOB:[birth year], 83 y.o., female Today's Date: 07/13/2024   PCP:   Auston Reyes BIRCH, MD   REFERRING PROVIDER:   Auston Reyes BIRCH, MD    END OF SESSION:     Past Medical History:  Diagnosis Date   Anal sphincter tear (healed) (old)-anterior 04/13/2014   Basal cell carcinoma    Chronic headaches    resolved   Depression    loss of spouse   Diverticulosis    Dizzy spells 02/23/2015   X 1year   HLD (hyperlipidemia)    HTN (hypertension) 09/27/2023   Past Surgical History:  Procedure Laterality Date   AUGMENTATION MAMMAPLASTY Bilateral    silicone implants put in over 30 yeras ago   CESAREAN SECTION     x 2   COLONOSCOPY     PARTIAL HYSTERECTOMY     TONSILLECTOMY     Patient Active Problem List   Diagnosis Date Noted   Demand ischemia (HCC) 09/28/2023   Anxiety disorder 09/28/2023   E coli bacteremia 09/28/2023   Sepsis secondary to UTI (HCC) 09/27/2023   UTI (urinary tract infection) 09/27/2023   NSTEMI (non-ST elevated myocardial infarction) (HCC) 09/27/2023   Acute kidney injury superimposed on chronic kidney disease 09/27/2023   HTN (hypertension) 09/27/2023   Acute metabolic encephalopathy 09/27/2023   Hyponatremia 09/27/2023   Shortness of breath 02/17/2015   Smoker 02/17/2015   Hyperlipidemia 02/17/2015   Osteoarthritis of both hips 02/17/2015   Fecal incontinence due to anorectal disorder 04/13/2014   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 04/13/2014    ONSET DATE: 3 months approx  REFERRING DIAG: R26.89 (ICD-10-CM) - Balance disorder   THERAPY DIAG:  Abnormality of gait and mobility  Difficulty in walking, not elsewhere classified  Unsteadiness on feet  Muscle weakness (generalized)  Rationale for Evaluation and Treatment: Rehabilitation  SUBJECTIVE:                                                                                                                                                                                              SUBJECTIVE STATEMENT:  Pt initially pointing at printed therapy schedule calendar while apologizing for missing the appointment last week. Anticipate pt having difficulty reading the calendar layout because once questioned, she did remember meeting prior therapist. Pt states I could not move all night long I ached so after last session. Pt states she didn't understand why that happened because she didn't feel like she did much during therapy. Pt states she did go to [facility] after her last session and that may have contributed to  her aching because she isn't used to doing that either. Denies pain today. Denies stumbles/falls.  Pt accompanied by: self  PERTINENT HISTORY: HTN, HLD  From eval: Pt reports with her balance it is hard to explain. Pt has a lot of stumbles when she is turning around and is not very sure footed. Pt reports this has been going on for several months. Pt has not had any falls in the last 6 months but several stumbles. Pt reports it started with a UTI and it got very bad during this. Since then there has been a noticeable difference in her balance, speech and her over all and has had some difficulty with word finding. Pt has been doing a lot less and feels she needs to do more things.  Patient also reports she has a fear of bending down to pick things up at times although she is able to do it in the clinic she reports the other day she was too afraid to bend down to pick something up she needed off the ground and had to ask for assistance.  Edition patient feels she is unable to go up and down a full flight of steps and has absolute certainty of falling with here in her opinion.  PAIN:  Are you having pain? No  PRECAUTIONS: Fall  RED FLAGS: None   WEIGHT BEARING RESTRICTIONS: No  FALLS: Has patient fallen in last 6 months? No and several stumbles   LIVING ENVIRONMENT: Lives with: lives with their  family and and has a dog  Lives in: House/apartment Stairs: Yes: External: 2 steps; can reach both Has following equipment at home: None  PLOF: Independent and Independent with basic ADLs  PATIENT GOALS: Improve her overall mobility and safety with balance   OBJECTIVE:  Note: Objective measures were completed at Evaluation unless otherwise noted.  DIAGNOSTIC FINDINGS: n/a  COGNITION: Overall cognitive status: pt reports some memory and word finding impairment since UTI   SENSATION: Not tested    LOWER EXTREMITY ROM:     WFL for tasks assessed   LOWER EXTREMITY MMT:    MMT Right Eval Left Eval  Hip flexion 4+ 4+  Hip extension    Hip abduction 4+ 4+  Hip adduction 4+ 4+  Hip internal rotation    Hip external rotation    Knee flexion 4+ 4+  Knee extension 4+ 4+  Ankle dorsiflexion 4+ 4+  Ankle plantarflexion    Ankle inversion    Ankle eversion    (Blank rows = not tested)   TRANSFERS: Sit to stand: Complete Independence  Assistive device utilized: None     Stand to sit: Complete Independence  Assistive device utilized: None     Chair to chair: Complete Independence  Assistive device utilized: None       RAMP:  Not tested  CURB:  Not tested  STAIRS: Findings: Level of Assistance: Modified independence, Stair Negotiation Technique: Alternating Pattern  with Bilateral Rails, Number of Stairs: 4, Height of Stairs: 6 in   , and Comments: Is afraid of doing a flight of steps, feels as though she would fall.  GAIT: Findings: Distance walked: 50 ft and Comments: NBOS, unsteady at times   FUNCTIONAL TESTS:  5 times sit to stand: 15.69 sec  10 meter walk test: 12.99 sec = .76 m/s : 265 ft, mild imbalance at times, no AD  Dynamic Gait Index: Test visit 2      PATIENT SURVEYS:  ABC scale: The Activities-Specific Balance Confidence (  ABC) Scale 0% 10 20 30  40 50 60 70 80 90 100% No confidence<->completely confident  "How confident are you that you will  not lose your balance or become unsteady when you . . .   Date tested 06/29/24  Walk around the house 100%  2. Walk up or down stairs 100%  3. Bend over and pick up a slipper from in front of a closet floor 80%  4. Reach for a small can off a shelf at eye level 100%  5. Stand on tip toes and reach for something above your head 90%  6. Stand on a chair and reach for something 0%  7. Sweep the floor 100%  8. Walk outside the house to a car parked in the driveway 100%  9. Get into or out of a car 100%  10. Walk across a parking lot to the mall 70%  11. Walk up or down a ramp 80%  12. Walk in a crowded mall where people rapidly walk past you 100%  13. Are bumped into by people as you walk through the mall 80%  14. Step onto or off of an escalator while you are holding onto the railing 100%  15. Step onto or off an escalator while holding onto parcels such that you cannot hold onto the railing 100%  16. Walk outside on icy sidewalks 0%  Total: #/16 81.25%                                                                                                                                 TREATMENT DATE: 07/13/24   Unless otherwise stated, CGA was provided and gait belt donned in order to ensure pt safety throughout session.  Patient participated in Dynamic Gait Index (DGI) and demonstrates increased fall risk as noted by score of 13/24. (< 19/24 = predictive of falls risk in community dwelling elderly). Patient with greatest impairments when performing head turns (horizontal > vertical), change in gait speed, and pivot turn. Pt requires several small steps to maintain balance when turning.    Patient becomes SOB following completion of DGI, despite standing rest breaks throughout. SpO2 99%  During gait, pt demonstrates abnormal spine alignment with R shoulder elevated compared to L and decreased L foot clearance compared to R as well as decreased step lengths bilaterally and decreased trunk  rotation and arm swing.   Performed the following in preparation to provide patient with HEP and gauge her response to exercise given increased soreness/ache after last session:  Sit<>stands without UE support Cuing to only tap hips down, NOT sit down fully each time, and pt reports this as more challenging and states she will do it at home, but states she would be bored if she did true sit<>stands because they are too easy X8 reps Standing marches at counter with B UE support X10 reps each LE Does well maintaining balance to safely perform at home, but  pt reports she can feel this is working on her balance Standing NBOS Started with x5 reps horizontal head turns, but pt states this is too easy Transitioned to x10reps vertical head nods and pt reports this as challenging, primarily because of minor posterior lean tendency when nodding head up Cuing/education to only nod head and not arch through entire body  *pt again noted to have mild SOB with limited activity during session  - difficulty getting consistent reading on dynamap with HR; however, appears it may be low/irregular on the reading  Provided HEP printout and reviewed with patient to ensure understanding and improve recall of proper form/technique at home due to concern of cognitive/memory recall. Pt states she has difficulty remember things and this is noticed during session.  PATIENT EDUCATION: Education details: POC Person educated: Patient Education method: Explanation Education comprehension: verbalized understanding   HOME EXERCISE PROGRAM: Access Code: [email or web address] URL: [email or web address] Date: 07/06/2024 Prepared by: Connell Kiss  Exercises - Sit to Stand with Hands on Knees  - 1 x daily - 7 x weekly - 2 sets - 5 reps - Standing March with Counter Support  - 1 x daily - 7 x weekly - 2 sets - 10 reps - Narrow Stance with Counter Support - Vertical Head Nods  - 1 x daily - 7 x weekly - 2 sets - 10  reps   GOALS: Goals reviewed with patient? No  SHORT TERM GOALS: Target date: 07/27/2024  Patient will be independent in home exercise program to improve strength/mobility for better functional independence with ADLs. Baseline: No HEP currently  Goal status: INITIAL  LONG TERM GOALS: Target date: 09/21/2024   1.  Patient will complete five times sit to stand test in < 13 seconds indicating an increased LE strength and improved balance. Baseline: 15.69 sec Goal status: INITIAL  2.  Patient will improve ABC scale score to 90%   to demonstrate statistically significant improvement in mobility and quality of life as it relates to their balance confidence.  Baseline: 81.25% Goal status: INITIAL   3.  Patient will increase Berg Balance score by > 6 points to demonstrate decreased fall risk during functional activities. Baseline: 46 Goal status: INITIAL   4.   Patient will improve DGI score by 4 points or greater to reduce fall risk and demonstrate improved dynamic balance. Baseline: 07/06/24: 13/24 Goal status: INITIAL  5.   Patient will increase 10 meter walk test to >1.92m/s as to improve gait speed for better community ambulation and to reduce fall risk. Baseline: .76 m/s Goal status: INITIAL  6.   Patient will increase 2 minute walk test distance to >400 ft for progression to community ambulator and improve gait ability Baseline: 265 ft Goal status: INITIAL   ASSESSMENT:  CLINICAL IMPRESSION:  Patient is a 83 year old female who presents to physical therapy for issues regarding balance and mobility.  Therapy session focused on further assessment of patient's dynamic gait via DGI with pt demonstrating high fall risk by score of 13/24. Patient with greatest balance impairments noted during horizontal head turns, pivot turn, and change in gait speed. Pt noted to require multiple small steps to maintain balance when turning. Noted to have decreased activity tolerance, quickly  becoming SOB with limited activity during session; however, SpO2 WNL and inconsistent HR reading on dynamap indicating potential irregular rhythm. Therapy session focused on initiation of simple HEP with exercises targeting B LE strength and balance to ensure pt successful at completing independently at home. Patient  will benefit physical physical therapy to address her deficits and improve her quality of life.  OBJECTIVE IMPAIRMENTS: Abnormal gait, decreased activity tolerance, decreased balance, decreased endurance, difficulty walking, and decreased strength.   ACTIVITY LIMITATIONS: standing, stairs, and locomotion level  PARTICIPATION LIMITATIONS: laundry, shopping, community activity, and yard work  PERSONAL FACTORS: Age and 1-2 comorbidities: HLD, HTN are also affecting patient's functional outcome.   REHAB POTENTIAL: Good  CLINICAL DECISION MAKING: Evolving/moderate complexity  EVALUATION COMPLEXITY: Moderate  PLAN:  PT FREQUENCY: 2x/week  PT DURATION: 12 weeks  PLANNED INTERVENTIONS: [id number]- Physical Performance Testing, 97110-Therapeutic exercises, 97530- Therapeutic activity, 97112- Neuromuscular re-education, 97535- Self Care, [id number]- Manual therapy, [phone]- Gait training, Balance training, Stair training, DME instructions, and Moist heat  PLAN FOR NEXT SESSION: Follow-up on provided HEP  Ensure performing exercises correctly Dynamic gait including: Turning Horizontal head turns Change in gait speed Dynamic balance and B LE strengthening including: Step ups   Note: Portions of this document were prepared using Dragon voice recognition software and although reviewed may contain unintentional dictation errors in syntax, grammar, or spelling.  Lonni KATHEE Gainer PT ,DPT Physical Therapist- [place]  [facility]   9:22 AM 07/13/24

## 2024-07-13 NOTE — Therapy (Signed)
 OUTPATIENT PHYSICAL THERAPY NEURO TREATMENT   Patient Name: Nannette Zill MRN: [id number] DOB:1941-01-25, 83 y.o., female Today's Date: 07/13/2024   PCP:   Auston Reyes BIRCH, MD   REFERRING PROVIDER:   Auston Reyes BIRCH, MD    END OF SESSION:   PT End of Session - 07/13/24 1447     Visit Number 3    Number of Visits 24    Date for Recertification  09/21/24    PT Start Time 1449    PT Stop Time 1530    PT Time Calculation (min) 41 min    Equipment Utilized During Treatment Gait belt    Activity Tolerance Patient tolerated treatment well    Behavior During Therapy [facility] for tasks assessed/performed            Past Medical History:  Diagnosis Date   Anal sphincter tear (healed) (old)-anterior 04/13/2014   Basal cell carcinoma    Chronic headaches    resolved   Depression    loss of spouse   Diverticulosis    Dizzy spells 02/23/2015   X 1year   HLD (hyperlipidemia)    HTN (hypertension) 09/27/2023   Past Surgical History:  Procedure Laterality Date   AUGMENTATION MAMMAPLASTY Bilateral    silicone implants put in over 30 yeras ago   CESAREAN SECTION     x 2   COLONOSCOPY     PARTIAL HYSTERECTOMY     TONSILLECTOMY     Patient Active Problem List   Diagnosis Date Noted   Demand ischemia (HCC) 09/28/2023   Anxiety disorder 09/28/2023   E coli bacteremia 09/28/2023   Sepsis secondary to UTI (HCC) 09/27/2023   UTI (urinary tract infection) 09/27/2023   NSTEMI (non-ST elevated myocardial infarction) (HCC) 09/27/2023   Acute kidney injury superimposed on chronic kidney disease 09/27/2023   HTN (hypertension) 09/27/2023   Acute metabolic encephalopathy 09/27/2023   Hyponatremia 09/27/2023   Shortness of breath 02/17/2015   Smoker 02/17/2015   Hyperlipidemia 02/17/2015   Osteoarthritis of both hips 02/17/2015   Fecal incontinence due to anorectal disorder 04/13/2014   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 04/13/2014    ONSET DATE: 3 months approx  REFERRING DIAG:  R26.89 (ICD-10-CM) - Balance disorder   THERAPY DIAG:  Abnormality of gait and mobility  Difficulty in walking, not elsewhere classified  Unsteadiness on feet  Muscle weakness (generalized)  Rationale for Evaluation and Treatment: Rehabilitation  SUBJECTIVE:  SUBJECTIVE STATEMENT:  Pt states she wasn't able to do her exercises because she was clumsy and off balance. Pt requesting to perform them during session today to see if she can learn how to do them safely at home. Denies stumbles/falls. Pt states her back hurts real bad following last therapy session in R lower lumbar region. Pt states she took some pain medication after last session, states she doesn't normally need to take that medication, but it didn't result in noticeable improvement. Reports pain currently 4/10 - described as a stabbing pain that brushes or fans over towards her side. Pt denies improvement in symptoms following pain medication, but states sometimes Tylenol  helps. Pt declines the need to perform interventions to decrease her pain to start session.   Pt accompanied by: self  PERTINENT HISTORY: HTN, HLD  From eval: Pt reports with her balance it is hard to explain. Pt has a lot of stumbles when she is turning around and is not very sure footed. Pt reports this has been going on for several months. Pt has not had any falls in the last 6 months but several stumbles. Pt reports it started with a UTI and it got very bad during this. Since then there has been a noticeable difference in her balance, speech and her over all and has had some difficulty with word finding. Pt has been doing a lot less and feels she needs to do more things.  Patient also reports she has a fear of bending down to pick things up at times although she is able  to do it in the clinic she reports the other day she was too afraid to bend down to pick something up she needed off the ground and had to ask for assistance.  Edition patient feels she is unable to go up and down a full flight of steps and has absolute certainty of falling with here in her opinion.  PAIN:  Are you having pain? No  PRECAUTIONS: Fall  RED FLAGS: None   WEIGHT BEARING RESTRICTIONS: No  FALLS: Has patient fallen in last 6 months? No and several stumbles   LIVING ENVIRONMENT: Lives with: lives with their family and and has a dog  Lives in: House/apartment Stairs: Yes: External: 2 steps; can reach both Has following equipment at home: None  PLOF: Independent and Independent with basic ADLs  PATIENT GOALS: Improve her overall mobility and safety with balance   OBJECTIVE:  Note: Objective measures were completed at Evaluation unless otherwise noted.  DIAGNOSTIC FINDINGS: n/a  COGNITION: Overall cognitive status: pt reports some memory and word finding impairment since UTI   SENSATION: Not tested    LOWER EXTREMITY ROM:     WFL for tasks assessed   LOWER EXTREMITY MMT:    MMT Right Eval Left Eval  Hip flexion 4+ 4+  Hip extension    Hip abduction 4+ 4+  Hip adduction 4+ 4+  Hip internal rotation    Hip external rotation    Knee flexion 4+ 4+  Knee extension 4+ 4+  Ankle dorsiflexion 4+ 4+  Ankle plantarflexion    Ankle inversion    Ankle eversion    (Blank rows = not tested)   TRANSFERS: Sit to stand: Complete Independence  Assistive device utilized: None     Stand to sit: Complete Independence  Assistive device utilized: None     Chair to chair: Complete Independence  Assistive device utilized: None       RAMP:  Not tested  CURB:  Not tested  STAIRS: Findings: Level of Assistance: Modified independence, Stair Negotiation Technique: Alternating Pattern  with Bilateral Rails, Number of Stairs: 4, Height of Stairs: 6 in   , and Comments:  Is afraid of doing a flight of steps, feels as though she would fall.  GAIT: Findings: Distance walked: 50 ft and Comments: NBOS, unsteady at times   FUNCTIONAL TESTS:  5 times sit to stand: 15.69 sec  10 meter walk test: 12.99 sec = .76 m/s : 265 ft, mild imbalance at times, no AD  Dynamic Gait Index: Test visit 2      PATIENT SURVEYS:  ABC scale: The Activities-Specific Balance Confidence (ABC) Scale 0% 10 20 30  40 50 60 70 80 90 100% No confidence<->completely confident  "How confident are you that you will not lose your balance or become unsteady when you . . .   Date tested 06/29/24  Walk around the house 100%  2. Walk up or down stairs 100%  3. Bend over and pick up a slipper from in front of a closet floor 80%  4. Reach for a small can off a shelf at eye level 100%  5. Stand on tip toes and reach for something above your head 90%  6. Stand on a chair and reach for something 0%  7. Sweep the floor 100%  8. Walk outside the house to a car parked in the driveway 100%  9. Get into or out of a car 100%  10. Walk across a parking lot to the mall 70%  11. Walk up or down a ramp 80%  12. Walk in a crowded mall where people rapidly walk past you 100%  13. Are bumped into by people as you walk through the mall 80%  14. Step onto or off of an escalator while you are holding onto the railing 100%  15. Step onto or off an escalator while holding onto parcels such that you cannot hold onto the railing 100%  16. Walk outside on icy sidewalks 0%  Total: #/16 81.25%                                                                                                                                 TREATMENT DATE: 07/13/24   Unless otherwise stated, CGA was provided and gait belt donned in order to ensure pt safety throughout session.  Gait into therapy clinic with pt noted to have minor imbalance with path deviaiton in the direction of her head turning/visual scanning - supervision  provided for safety.  Dynamic gait training ~231ft, no AD, with dual-task challenge of horizontal head turns to identify cards held up by therapist Noticed pt with greatest imbalance with diagonal head turns looking up>down Pt with SOB after initial ~23ft gait distance, provided standing rest break for symptom management, then assessed vitals below at end of gait training Close SBA provided for balance safety - pt able to  adjust steps and path deviation to regain balance without hands-on assist  Pt reports she sits too much at home as being the reason she is SOB after only ~31ft of gait training - educated pt on recommendation to walk for ~1-36minutes every 1-2 hours at home - added this to pt's written HEP to increase compliance.  HR 46bpm, SpO2 97% Pt will benefit from follow-up with her PCP regarding low HR - pt's HR noted to be 42bpm during last visit with her PCP on 06/17/24.  Printed pt's HEP to review with her and hand wrote specifics on it for her to reference at home in order to improve form/technique and pt safety.   Continued to monitor HR throughout session to be ~43-53bpm  STS 2x 5 reps Required cuing to avoid knees excessively adducting, will likely need reinforcement of this at next visit Cuing to put hands on knees for balance support Cuing to lower hips all the way down to seat, rather than performing mini squat Standing marches with light UE support on mat X10reps per LE Cuing for slower marches to increase time in SLS for increased balance challenge & pt reports greater challenge Pt demos ability to complete this safely at home Standing NBOS vertical head nods X10reps Reinforced with pt nodding head yes rather than pt attempting to arch backwards, with improvement noted this session  Pt reports increased confidence with performing exercises at home, following HEP review during session today.   Dynamic balance and B LE functional strengthening including: Step-ups  at stairs - 1st foot on 1st step and contralateral tapping 2nd step x8reps per LE Unilateral UE support CGA for safety  *Pt continues to experience SOB with minimal exertion throughout session and SpO2 WNL; however, HR bradycardic as noted  HR 52bpm on manual palpation - Dynamap states HR 44bpm - Therapist sent secure chat message to patient's PCP regarding her low HR and he requests she be scheduled for a follow-up. Requested his office reach out to patient to set this up, will follow-up with patient at next session.   Pt reports ever since she had a UTI this past December, she hasn't been right.    PATIENT EDUCATION: Education details: POC Person educated: Patient Education method: Explanation Education comprehension: verbalized understanding   HOME EXERCISE PROGRAM: Access Code: [email or web address] URL: [email or web address] Date: 07/06/2024 Prepared by: Connell Kiss  Exercises - Sit to Stand with Hands on Knees  - 1 x daily - 7 x weekly - 2 sets - 5 reps - Standing March with Counter Support  - 1 x daily - 7 x weekly - 2 sets - 10 reps - Narrow Stance with Counter Support - Vertical Head Nods  - 1 x daily - 7 x weekly - 2 sets - 10 reps   GOALS: Goals reviewed with patient? No  SHORT TERM GOALS: Target date: 07/27/2024  Patient will be independent in home exercise program to improve strength/mobility for better functional independence with ADLs. Baseline: No HEP currently  Goal status: INITIAL  LONG TERM GOALS: Target date: 09/21/2024   1.  Patient will complete five times sit to stand test in < 13 seconds indicating an increased LE strength and improved balance. Baseline: 15.69 sec Goal status: INITIAL  2.  Patient will improve ABC scale score to 90%   to demonstrate statistically significant improvement in mobility and quality of life as it relates to their balance confidence.  Baseline: 81.25% Goal status: INITIAL   3.  Patient will increase Lars  Balance  score by > 6 points to demonstrate decreased fall risk during functional activities. Baseline: 46 Goal status: INITIAL   4.   Patient will improve DGI score by 4 points or greater to reduce fall risk and demonstrate improved dynamic balance. Baseline: 07/06/24: 13/24 Goal status: INITIAL  5.   Patient will increase 10 meter walk test to >1.71m/s as to improve gait speed for better community ambulation and to reduce fall risk. Baseline: .76 m/s Goal status: INITIAL  6.   Patient will increase 2 minute walk test distance to >400 ft for progression to community ambulator and improve gait ability Baseline: 265 ft Goal status: INITIAL   ASSESSMENT:  CLINICAL IMPRESSION:  Patient is a 83 year old female who presents to physical therapy for impairments regarding balance and mobility.  Therapy session focused on dynamic gait training and review of HEP due to pt reported concern of feeling unbalanced to complete them safely at home. Patient continues to demonstrate imbalance with head turning during gait, causing path deviation in the direction she is looking, which is most noticeable during diagonal head turns up>down. Patient continues to experience SOB with minimal exertion throughout session with SpO2 WNL; however, pt noted to be bradycardic with HR ranging 43-53bpm during session. Per chart review, pt's HR was 42bpm at last follow-up with her PCP; however, pt may benefit from seeing PCP sooner than planned time in ~November. Patient demonstrates improved understanding of safe step-up and technique to complete her HEP independently at home using her printout as a reference following reinforced education/training during session today. Patient will benefit from continued focus on increased activity tolerance (monitoring HR), B LE functional strengthening, and dynamic balance tasks focusing on turning, head rotations/visual scanning, and variable direction stepping. Patient will benefit physical  physical therapy to address her deficits and improve her quality of life.  OBJECTIVE IMPAIRMENTS: Abnormal gait, decreased activity tolerance, decreased balance, decreased endurance, difficulty walking, and decreased strength.   ACTIVITY LIMITATIONS: standing, stairs, and locomotion level  PARTICIPATION LIMITATIONS: laundry, shopping, community activity, and yard work  PERSONAL FACTORS: Age and 1-2 comorbidities: HLD, HTN are also affecting patient's functional outcome.   REHAB POTENTIAL: Good  CLINICAL DECISION MAKING: Evolving/moderate complexity  EVALUATION COMPLEXITY: Moderate  PLAN:  PT FREQUENCY: 2x/week  PT DURATION: 12 weeks  PLANNED INTERVENTIONS: 97750- Physical Performance Testing, 97110-Therapeutic exercises, 97530- Therapeutic activity, 97112- Neuromuscular re-education, 97535- Self Care, [id number]- Manual therapy, [phone]- Gait training, Balance training, Stair training, DME instructions, and Moist heat  PLAN FOR NEXT SESSION:  *monitor HR during session* -- educate pt on scheduling follow-up with her PCP Follow-up on provided HEP  Ensure performing exercises correctly Dynamic gait including: Turning Horizontal head turns Change in gait speed Dynamic balance and B LE strengthening including: Step ups   Devyn Griffing, PT, DPT, NCS, CSRS Physical Therapist - [place]  [facility]  3:34 PM 07/13/24

## 2024-07-15 NOTE — Therapy (Signed)
 OUTPATIENT PHYSICAL THERAPY NEURO TREATMENT   Patient Name: Brenda Wiley MRN: [id number] DOB:Mar 28, 1941, 83 y.o., female Today's Date: 07/20/2024   PCP:   Auston Reyes BIRCH, MD   REFERRING PROVIDER:   Auston Reyes BIRCH, MD    END OF SESSION:   PT End of Session - 07/20/24 1452     Visit Number 4    Number of Visits 24    Date for Recertification  09/21/24    Progress Note Due on Visit 10    PT Start Time 1448    PT Stop Time 1530    PT Time Calculation (min) 42 min    Equipment Utilized During Treatment Gait belt    Activity Tolerance Patient tolerated treatment well    Behavior During Therapy [facility] for tasks assessed/performed             Past Medical History:  Diagnosis Date   Anal sphincter tear (healed) (old)-anterior 04/13/2014   Basal cell carcinoma    Chronic headaches    resolved   Depression    loss of spouse   Diverticulosis    Dizzy spells 02/23/2015   X 1year   HLD (hyperlipidemia)    HTN (hypertension) 09/27/2023   Past Surgical History:  Procedure Laterality Date   AUGMENTATION MAMMAPLASTY Bilateral    silicone implants put in over 30 yeras ago   CESAREAN SECTION     x 2   COLONOSCOPY     PARTIAL HYSTERECTOMY     TONSILLECTOMY     Patient Active Problem List   Diagnosis Date Noted   Demand ischemia (HCC) 09/28/2023   Anxiety disorder 09/28/2023   E coli bacteremia 09/28/2023   Sepsis secondary to UTI (HCC) 09/27/2023   UTI (urinary tract infection) 09/27/2023   NSTEMI (non-ST elevated myocardial infarction) (HCC) 09/27/2023   Acute kidney injury superimposed on chronic kidney disease 09/27/2023   HTN (hypertension) 09/27/2023   Acute metabolic encephalopathy 09/27/2023   Hyponatremia 09/27/2023   Shortness of breath 02/17/2015   Smoker 02/17/2015   Hyperlipidemia 02/17/2015   Osteoarthritis of both hips 02/17/2015   Fecal incontinence due to anorectal disorder 04/13/2014   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 04/13/2014    ONSET DATE: 3  months approx  REFERRING DIAG: R26.89 (ICD-10-CM) - Balance disorder   THERAPY DIAG:  Abnormality of gait and mobility  Difficulty in walking, not elsewhere classified  Unsteadiness on feet  Rationale for Evaluation and Treatment: Rehabilitation  SUBJECTIVE:  SUBJECTIVE STATEMENT:   Pt reports she has been working on her HEP now, she may not have been doing them right but she is doing them per her report. Has not contacted MD regarding low HR, was encouraged to do to.   Pt states she wasn't able to do her exercises because she was clumsy and off balance. Pt requesting to perform them during session today to see if she can learn how to do them safely at home. Denies stumbles/falls. Pt states her back hurts real bad following last therapy session in R lower lumbar region. Pt states she took some pain medication after last session, states she doesn't normally need to take that medication, but it didn't result in noticeable improvement. Reports pain currently 4/10 - described as a stabbing pain that brushes or fans over towards her side. Pt denies improvement in symptoms following pain medication, but states sometimes Tylenol  helps. Pt declines the need to perform interventions to decrease her pain to start session.   Pt accompanied by: self  PERTINENT HISTORY: HTN, HLD  From eval: Pt reports with her balance it is hard to explain. Pt has a lot of stumbles when she is turning around and is not very sure footed. Pt reports this has been going on for several months. Pt has not had any falls in the last 6 months but several stumbles. Pt reports it started with a UTI and it got very bad during this. Since then there has been a noticeable difference in her balance, speech and her over all and has had some  difficulty with word finding. Pt has been doing a lot less and feels she needs to do more things.  Patient also reports she has a fear of bending down to pick things up at times although she is able to do it in the clinic she reports the other day she was too afraid to bend down to pick something up she needed off the ground and had to ask for assistance.  Edition patient feels she is unable to go up and down a full flight of steps and has absolute certainty of falling with here in her opinion.  PAIN:  Are you having pain? No  PRECAUTIONS: Fall  RED FLAGS: None   WEIGHT BEARING RESTRICTIONS: No  FALLS: Has patient fallen in last 6 months? No and several stumbles   LIVING ENVIRONMENT: Lives with: lives with their family and and has a dog  Lives in: House/apartment Stairs: Yes: External: 2 steps; can reach both Has following equipment at home: None  PLOF: Independent and Independent with basic ADLs  PATIENT GOALS: Improve her overall mobility and safety with balance   OBJECTIVE:  Note: Objective measures were completed at Evaluation unless otherwise noted.  DIAGNOSTIC FINDINGS: n/a  COGNITION: Overall cognitive status: pt reports some memory and word finding impairment since UTI   SENSATION: Not tested    LOWER EXTREMITY ROM:     WFL for tasks assessed   LOWER EXTREMITY MMT:    MMT Right Eval Left Eval  Hip flexion 4+ 4+  Hip extension    Hip abduction 4+ 4+  Hip adduction 4+ 4+  Hip internal rotation    Hip external rotation    Knee flexion 4+ 4+  Knee extension 4+ 4+  Ankle dorsiflexion 4+ 4+  Ankle plantarflexion    Ankle inversion    Ankle eversion    (Blank rows = not tested)   TRANSFERS: Sit to stand: Complete Independence  Assistive  device utilized: None     Stand to sit: Complete Independence  Assistive device utilized: None     Chair to chair: Complete Independence  Assistive device utilized: None       RAMP:  Not tested  CURB:  Not  tested  STAIRS: Findings: Level of Assistance: Modified independence, Stair Negotiation Technique: Alternating Pattern  with Bilateral Rails, Number of Stairs: 4, Height of Stairs: 6 in   , and Comments: Is afraid of doing a flight of steps, feels as though she would fall.  GAIT: Findings: Distance walked: 50 ft and Comments: NBOS, unsteady at times   FUNCTIONAL TESTS:  5 times sit to stand: 15.69 sec  10 meter walk test: 12.99 sec = .76 m/s : 265 ft, mild imbalance at times, no AD  Dynamic Gait Index: Test visit 2      PATIENT SURVEYS:  ABC scale: The Activities-Specific Balance Confidence (ABC) Scale 0% 10 20 30  40 50 60 70 80 90 100% No confidence<->completely confident  "How confident are you that you will not lose your balance or become unsteady when you . . .   Date tested 06/29/24  Walk around the house 100%  2. Walk up or down stairs 100%  3. Bend over and pick up a slipper from in front of a closet floor 80%  4. Reach for a small can off a shelf at eye level 100%  5. Stand on tip toes and reach for something above your head 90%  6. Stand on a chair and reach for something 0%  7. Sweep the floor 100%  8. Walk outside the house to a car parked in the driveway 100%  9. Get into or out of a car 100%  10. Walk across a parking lot to the mall 70%  11. Walk up or down a ramp 80%  12. Walk in a crowded mall where people rapidly walk past you 100%  13. Are bumped into by people as you walk through the mall 80%  14. Step onto or off of an escalator while you are holding onto the railing 100%  15. Step onto or off an escalator while holding onto parcels such that you cannot hold onto the railing 100%  16. Walk outside on icy sidewalks 0%  Total: #/16 81.25%                                                                                                                                 TREATMENT DATE: 07/20/24   Unless otherwise stated, CGA was provided and gait belt donned  in order to ensure pt safety throughout session. TA- To improve functional movements patterns for everyday tasks   Nustep level 3 x 6 min  at 6 min 99 HR 41 at end   STS  x 5 - cues for full erect posture each rep for post chain muscular recruitment   Standing marching x 10 ea  NBOS with head turns x 10   NMR: To facilitate reeducation of movement, balance, posture, coordination, and/or proprioception/kinesthetic sense.  Stance on airex x 7 min while playing word  game, no LOB but some dizziness after .   Seated rest  Sidestepping 2 x 15 ft  HR 45 following and fatigue noted   Gait in hallway reading letters on wall x 2 laps, very imbalanced with many stumbles, unable to read all letters while maintaining semblence of smooth gait pattern.  Self Care:   BP: 159/55  mmHg with HR 42  Patient educated regarding importance of maintaining an adequate heart rate and how low heart rate can lead to dangerous conditions such as passing out and can also be a sign for potential arrhythmia.  Pt required occasional rest breaks due fatigue, PT was attentive to when pt appeared to be tired or winded in order to prevent excessive fatigue.   Continued to monitor HR throughout session to be 38-45 bpm     PATIENT EDUCATION: Education details: POC Person educated: Patient Education method: Explanation Education comprehension: verbalized understanding   HOME EXERCISE PROGRAM: Access Code: [email or web address] URL: [email or web address] Date: 07/06/2024 Prepared by: Connell Kiss  Exercises - Sit to Stand with Hands on Knees  - 1 x daily - 7 x weekly - 2 sets - 5 reps - Standing March with Counter Support  - 1 x daily - 7 x weekly - 2 sets - 10 reps - Narrow Stance with Counter Support - Vertical Head Nods  - 1 x daily - 7 x weekly - 2 sets - 10 reps   GOALS: Goals reviewed with patient? No  SHORT TERM GOALS: Target date: 07/27/2024  Patient will be independent in home exercise  program to improve strength/mobility for better functional independence with ADLs. Baseline: No HEP currently  Goal status: INITIAL  LONG TERM GOALS: Target date: 09/21/2024   1.  Patient will complete five times sit to stand test in < 13 seconds indicating an increased LE strength and improved balance. Baseline: 15.69 sec Goal status: INITIAL  2.  Patient will improve ABC scale score to 90%   to demonstrate statistically significant improvement in mobility and quality of life as it relates to their balance confidence.  Baseline: 81.25% Goal status: INITIAL   3.  Patient will increase Berg Balance score by > 6 points to demonstrate decreased fall risk during functional activities. Baseline: 46 Goal status: INITIAL   4.   Patient will improve DGI score by 4 points or greater to reduce fall risk and demonstrate improved dynamic balance. Baseline: 07/06/24: 13/24 Goal status: INITIAL  5.   Patient will increase 10 meter walk test to >1.33m/s as to improve gait speed for better community ambulation and to reduce fall risk. Baseline: .76 m/s Goal status: INITIAL  6.   Patient will increase 2 minute walk test distance to >400 ft for progression to community ambulator and improve gait ability Baseline: 265 ft Goal status: INITIAL   ASSESSMENT:  CLINICAL IMPRESSION:  Patient is a 83 year old female who presents to physical therapy for impairments regarding balance and mobility.  Patient continues to demonstrate imbalance with head turning during gait, mor prominent with diagonal head turns.  38-47bpm during session. Pt highly encouraged to contact physician regarding low heart rate was and educated how this can be dangerous and induce lightheadedness and could be due to an arrhythmia. Patient will benefit from continued focus on increased activity tolerance (monitoring HR), B LE functional strengthening,  and dynamic balance tasks focusing on turning, head rotations/visual scanning, and  variable direction stepping. Patient will benefit physical physical therapy to address her deficits and improve her quality of life.  OBJECTIVE IMPAIRMENTS: Abnormal gait, decreased activity tolerance, decreased balance, decreased endurance, difficulty walking, and decreased strength.   ACTIVITY LIMITATIONS: standing, stairs, and locomotion level  PARTICIPATION LIMITATIONS: laundry, shopping, community activity, and yard work  PERSONAL FACTORS: Age and 1-2 comorbidities: HLD, HTN are also affecting patient's functional outcome.   REHAB POTENTIAL: Good  CLINICAL DECISION MAKING: Evolving/moderate complexity  EVALUATION COMPLEXITY: Moderate  PLAN:  PT FREQUENCY: 2x/week  PT DURATION: 12 weeks  PLANNED INTERVENTIONS: 97750- Physical Performance Testing, 97110-Therapeutic exercises, 97530- Therapeutic activity, 97112- Neuromuscular re-education, 97535- Self Care, [id number]- Manual therapy, [phone]- Gait training, Balance training, Stair training, DME instructions, and Moist heat  PLAN FOR NEXT SESSION:  *monitor HR during session* Follow-up on provided HEP  Ensure performing exercises correctly Dynamic gait including: Turning Horizontal head turns Change in gait speed Dynamic balance and B LE strengthening including: Step ups   Note: Portions of this document were prepared using Dragon voice recognition software and although reviewed may contain unintentional dictation errors in syntax, grammar, or spelling.  Lonni KATHEE Gainer PT ,DPT Physical Therapist- [place]  [facility]   2:53 PM 07/20/24

## 2024-07-20 ENCOUNTER — Ambulatory Visit: Admitting: Physical Therapy

## 2024-07-20 DIAGNOSIS — R269 Unspecified abnormalities of gait and mobility: Secondary | ICD-10-CM | POA: Diagnosis not present

## 2024-07-20 DIAGNOSIS — R262 Difficulty in walking, not elsewhere classified: Secondary | ICD-10-CM

## 2024-07-20 DIAGNOSIS — R2681 Unsteadiness on feet: Secondary | ICD-10-CM

## 2024-07-27 ENCOUNTER — Ambulatory Visit: Attending: Internal Medicine | Admitting: Physical Therapy

## 2024-07-27 DIAGNOSIS — R262 Difficulty in walking, not elsewhere classified: Secondary | ICD-10-CM | POA: Insufficient documentation

## 2024-07-27 DIAGNOSIS — M6281 Muscle weakness (generalized): Secondary | ICD-10-CM | POA: Diagnosis not present

## 2024-07-27 DIAGNOSIS — R2681 Unsteadiness on feet: Secondary | ICD-10-CM | POA: Insufficient documentation

## 2024-07-27 DIAGNOSIS — R269 Unspecified abnormalities of gait and mobility: Secondary | ICD-10-CM | POA: Insufficient documentation

## 2024-07-27 NOTE — Therapy (Signed)
 OUTPATIENT PHYSICAL THERAPY NEURO TREATMENT   Patient Name: Brenda Wiley MRN: [id number] DOB:[birth year], 83 y.o., female Today's Date: 07/27/2024   PCP:   Auston Reyes BIRCH, MD   REFERRING PROVIDER:   Auston Reyes BIRCH, MD    END OF SESSION:   PT End of Session - 07/27/24 1536     Visit Number 5    Number of Visits 24    Date for Recertification  09/21/24    Progress Note Due on Visit 10    PT Start Time 1533    PT Stop Time 1612    PT Time Calculation (min) 39 min    Equipment Utilized During Treatment Gait belt    Activity Tolerance Patient tolerated treatment well    Behavior During Therapy [facility] for tasks assessed/performed              Past Medical History:  Diagnosis Date   Anal sphincter tear (healed) (old)-anterior 04/13/2014   Basal cell carcinoma    Chronic headaches    resolved   Depression    loss of spouse   Diverticulosis    Dizzy spells 02/23/2015   X 1year   HLD (hyperlipidemia)    HTN (hypertension) 09/27/2023   Past Surgical History:  Procedure Laterality Date   AUGMENTATION MAMMAPLASTY Bilateral    silicone implants put in over 30 yeras ago   CESAREAN SECTION     x 2   COLONOSCOPY     PARTIAL HYSTERECTOMY     TONSILLECTOMY     Patient Active Problem List   Diagnosis Date Noted   Demand ischemia (HCC) 09/28/2023   Anxiety disorder 09/28/2023   E coli bacteremia 09/28/2023   Sepsis secondary to UTI (HCC) 09/27/2023   UTI (urinary tract infection) 09/27/2023   NSTEMI (non-ST elevated myocardial infarction) (HCC) 09/27/2023   Acute kidney injury superimposed on chronic kidney disease 09/27/2023   HTN (hypertension) 09/27/2023   Acute metabolic encephalopathy 09/27/2023   Hyponatremia 09/27/2023   Shortness of breath 02/17/2015   Smoker 02/17/2015   Hyperlipidemia 02/17/2015   Osteoarthritis of both hips 02/17/2015   Fecal incontinence due to anorectal disorder 04/13/2014   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 04/13/2014    ONSET DATE: 3  months approx  REFERRING DIAG: R26.89 (ICD-10-CM) - Balance disorder   THERAPY DIAG:  Abnormality of gait and mobility  Difficulty in walking, not elsewhere classified  Unsteadiness on feet  Muscle weakness (generalized)  Rationale for Evaluation and Treatment: Rehabilitation  SUBJECTIVE:  SUBJECTIVE STATEMENT:   Pt has contacted MD and has appointment Thursday following PT appointment.   Pt accompanied by: self  PERTINENT HISTORY: HTN, HLD  From eval: Pt reports with her balance it is hard to explain. Pt has a lot of stumbles when she is turning around and is not very sure footed. Pt reports this has been going on for several months. Pt has not had any falls in the last 6 months but several stumbles. Pt reports it started with a UTI and it got very bad during this. Since then there has been a noticeable difference in her balance, speech and her over all and has had some difficulty with word finding. Pt has been doing a lot less and feels she needs to do more things.  Patient also reports she has a fear of bending down to pick things up at times although she is able to do it in the clinic she reports the other day she was too afraid to bend down to pick something up she needed off the ground and had to ask for assistance.  Edition patient feels she is unable to go up and down a full flight of steps and has absolute certainty of falling with here in her opinion.  PAIN:  Are you having pain? No  PRECAUTIONS: Fall  RED FLAGS: None   WEIGHT BEARING RESTRICTIONS: No  FALLS: Has patient fallen in last 6 months? No and several stumbles   LIVING ENVIRONMENT: Lives with: lives with their family and and has a dog  Lives in: House/apartment Stairs: Yes: External: 2 steps; can reach both Has following  equipment at home: None  PLOF: Independent and Independent with basic ADLs  PATIENT GOALS: Improve her overall mobility and safety with balance   OBJECTIVE:  Note: Objective measures were completed at Evaluation unless otherwise noted.  DIAGNOSTIC FINDINGS: n/a  COGNITION: Overall cognitive status: pt reports some memory and word finding impairment since UTI   SENSATION: Not tested    LOWER EXTREMITY ROM:     WFL for tasks assessed   LOWER EXTREMITY MMT:    MMT Right Eval Left Eval  Hip flexion 4+ 4+  Hip extension    Hip abduction 4+ 4+  Hip adduction 4+ 4+  Hip internal rotation    Hip external rotation    Knee flexion 4+ 4+  Knee extension 4+ 4+  Ankle dorsiflexion 4+ 4+  Ankle plantarflexion    Ankle inversion    Ankle eversion    (Blank rows = not tested)   TRANSFERS: Sit to stand: Complete Independence  Assistive device utilized: None     Stand to sit: Complete Independence  Assistive device utilized: None     Chair to chair: Complete Independence  Assistive device utilized: None       RAMP:  Not tested  CURB:  Not tested  STAIRS: Findings: Level of Assistance: Modified independence, Stair Negotiation Technique: Alternating Pattern  with Bilateral Rails, Number of Stairs: 4, Height of Stairs: 6 in   , and Comments: Is afraid of doing a flight of steps, feels as though she would fall.  GAIT: Findings: Distance walked: 50 ft and Comments: NBOS, unsteady at times   FUNCTIONAL TESTS:  5 times sit to stand: 15.69 sec  10 meter walk test: 12.99 sec = .76 m/s : 265 ft, mild imbalance at times, no AD  Dynamic Gait Index: Test visit 2      PATIENT SURVEYS:  ABC scale: The Activities-Specific Balance  Confidence (ABC) Scale 0% 10 20 30  40 50 60 70 80 90 100% No confidence<->completely confident  "How confident are you that you will not lose your balance or become unsteady when you . . .   Date tested 06/29/24  Walk around the house 100%  2.  Walk up or down stairs 100%  3. Bend over and pick up a slipper from in front of a closet floor 80%  4. Reach for a small can off a shelf at eye level 100%  5. Stand on tip toes and reach for something above your head 90%  6. Stand on a chair and reach for something 0%  7. Sweep the floor 100%  8. Walk outside the house to a car parked in the driveway 100%  9. Get into or out of a car 100%  10. Walk across a parking lot to the mall 70%  11. Walk up or down a ramp 80%  12. Walk in a crowded mall where people rapidly walk past you 100%  13. Are bumped into by people as you walk through the mall 80%  14. Step onto or off of an escalator while you are holding onto the railing 100%  15. Step onto or off an escalator while holding onto parcels such that you cannot hold onto the railing 100%  16. Walk outside on icy sidewalks 0%  Total: #/16 81.25%                                                                                                                                 TREATMENT DATE: 07/27/24   Unless otherwise stated, CGA was provided and gait belt donned in order to ensure pt safety throughout session.  TA- To improve functional movements patterns for everyday tasks   HR seated at rest to start 42 BPM  STS 3 x 6 - does not go to sit on a few reps but challenged to improve on reps.  HR 37 BPM  Forward and retro gait 3 x 20 ft HR 52 BPM  Sidestepping 3 x 15 ft    NMR: To facilitate reeducation of movement, balance, posture, coordination, and/or proprioception/kinesthetic sense.  1 LE on floor other on step 3 x 30 sec ea HR 40 BPM following   Airex stance x 30 sec  - added head turns 2 x 10 ea direction HR 45 BPM following  Step tap to 6 in step no UE assist for dynamic SLS practice   Self Care:  Patient educated regarding importance of maintaining an adequate heart rate and how low heart rate can lead to dangerous conditions such as passing out and can also be a sign for  potential arrhythmia. Provided the patient with the following in printout form to provide to physician at appointment Thursday   Patient's heart rate very low and does not respond to activity in a normal fashion At start of treatment session pt  resting heart rate was 42 BPM After some light activity it was down to 38-40 BPM at one point pulse ox read 31-33 BPM Concerned for some type of arrythmia or another condition that could be causing this, as pulse ox showing inconsistent pulse at times (although we recognize these can be inaccurate we thought it was worth noting and being addressed if needed) Thank you for your concern with this matter.  Lonni Gainer PT, DPT  [facility]  Pt required occasional rest breaks due fatigue, PT was attentive to when pt appeared to be tired or winded in order to prevent excessive fatigue.   Continued to monitor HR throughout session to be 32-45 bpm    PATIENT EDUCATION: Education details: POC Person educated: Patient Education method: Explanation Education comprehension: verbalized understanding   HOME EXERCISE PROGRAM: Access Code: [email or web address] URL: [email or web address] Date: 07/06/2024 Prepared by: Connell Kiss  Exercises - Sit to Stand with Hands on Knees  - 1 x daily - 7 x weekly - 2 sets - 5 reps - Standing March with Counter Support  - 1 x daily - 7 x weekly - 2 sets - 10 reps - Narrow Stance with Counter Support - Vertical Head Nods  - 1 x daily - 7 x weekly - 2 sets - 10 reps   GOALS: Goals reviewed with patient? No  SHORT TERM GOALS: Target date: 07/27/2024  Patient will be independent in home exercise program to improve strength/mobility for better functional independence with ADLs. Baseline: No HEP currently  Goal status: INITIAL  LONG TERM GOALS: Target date: 09/21/2024   1.  Patient will complete five times sit to stand test in < 13 seconds indicating an increased LE strength and improved  balance. Baseline: 15.69 sec Goal status: INITIAL  2.  Patient will improve ABC scale score to 90%   to demonstrate statistically significant improvement in mobility and quality of life as it relates to their balance confidence.  Baseline: 81.25% Goal status: INITIAL   3.  Patient will increase Berg Balance score by > 6 points to demonstrate decreased fall risk during functional activities. Baseline: 46 Goal status: INITIAL   4.   Patient will improve DGI score by 4 points or greater to reduce fall risk and demonstrate improved dynamic balance. Baseline: 07/06/24: 13/24 Goal status: INITIAL  5.   Patient will increase 10 meter walk test to >1.67m/s as to improve gait speed for better community ambulation and to reduce fall risk. Baseline: .76 m/s Goal status: INITIAL  6.   Patient will increase 2 minute walk test distance to >400 ft for progression to community ambulator and improve gait ability Baseline: 265 ft Goal status: INITIAL   ASSESSMENT:  CLINICAL IMPRESSION:   Patient is a 83 year old female who presents to physical therapy for impairments regarding balance and mobility.  Pt has very bradycardic HR throughout session of 32-45 bpm during session. Pt provided with document to show MD as she was not sure what to tell him about what was going on.  Patient will benefit from continued focus on increased activity tolerance (monitoring HR), B LE functional strengthening, and dynamic balance tasks focusing on turning, head rotations/visual scanning, and variable direction stepping. Patient will benefit physical physical therapy to address her deficits and improve her quality of life.  OBJECTIVE IMPAIRMENTS: Abnormal gait, decreased activity tolerance, decreased balance, decreased endurance, difficulty walking, and decreased strength.   ACTIVITY LIMITATIONS: standing, stairs, and locomotion level  PARTICIPATION LIMITATIONS: laundry, shopping, community activity,  and yard  work  PERSONAL FACTORS: Age and 1-2 comorbidities: HLD, HTN are also affecting patient's functional outcome.   REHAB POTENTIAL: Good  CLINICAL DECISION MAKING: Evolving/moderate complexity  EVALUATION COMPLEXITY: Moderate  PLAN:  PT FREQUENCY: 2x/week  PT DURATION: 12 weeks  PLANNED INTERVENTIONS: 97750- Physical Performance Testing, 97110-Therapeutic exercises, 97530- Therapeutic activity, 97112- Neuromuscular re-education, 97535- Self Care, [id number]- Manual therapy, [phone]- Gait training, Balance training, Stair training, DME instructions, and Moist heat  PLAN FOR NEXT SESSION:  *monitor HR during session* Follow-up on provided HEP  Ensure performing exercises correctly Dynamic gait including: Turning Horizontal head turns Change in gait speed Dynamic balance and B LE strengthening including: Step ups   Note: Portions of this document were prepared using Dragon voice recognition software and although reviewed may contain unintentional dictation errors in syntax, grammar, or spelling.  Lonni KATHEE Gainer PT ,DPT Physical Therapist- [place]  [facility]   5:18 PM 07/27/24

## 2024-07-29 ENCOUNTER — Ambulatory Visit: Admitting: Physical Therapy

## 2024-07-29 DIAGNOSIS — R262 Difficulty in walking, not elsewhere classified: Secondary | ICD-10-CM

## 2024-07-29 DIAGNOSIS — M6281 Muscle weakness (generalized): Secondary | ICD-10-CM

## 2024-07-29 DIAGNOSIS — F339 Major depressive disorder, recurrent, unspecified: Secondary | ICD-10-CM | POA: Diagnosis not present

## 2024-07-29 DIAGNOSIS — R269 Unspecified abnormalities of gait and mobility: Secondary | ICD-10-CM

## 2024-07-29 DIAGNOSIS — R001 Bradycardia, unspecified: Secondary | ICD-10-CM | POA: Diagnosis not present

## 2024-07-29 DIAGNOSIS — R2681 Unsteadiness on feet: Secondary | ICD-10-CM

## 2024-07-29 NOTE — Therapy (Signed)
 OUTPATIENT PHYSICAL THERAPY NEURO TREATMENT   Patient Name: Brenda Wiley MRN: [id number] DOB:1941/07/05, 83 y.o., female Today's Date: 07/29/2024   PCP:   Auston Reyes BIRCH, MD   REFERRING PROVIDER:   Auston Reyes BIRCH, MD    END OF SESSION:   PT End of Session - 07/29/24 1316     Visit Number 5   unbilled visit   Number of Visits 24    Date for Recertification  09/21/24    Progress Note Due on Visit 10    PT Start Time 1316    PT Stop Time 1333    PT Time Calculation (min) 17 min    Equipment Utilized During Treatment --    Activity Tolerance --    Behavior During Therapy [facility] for tasks assessed/performed               Past Medical History:  Diagnosis Date   Anal sphincter tear (healed) (old)-anterior 04/13/2014   Basal cell carcinoma    Chronic headaches    resolved   Depression    loss of spouse   Diverticulosis    Dizzy spells 02/23/2015   X 1year   HLD (hyperlipidemia)    HTN (hypertension) 09/27/2023   Past Surgical History:  Procedure Laterality Date   AUGMENTATION MAMMAPLASTY Bilateral    silicone implants put in over 30 yeras ago   CESAREAN SECTION     x 2   COLONOSCOPY     PARTIAL HYSTERECTOMY     TONSILLECTOMY     Patient Active Problem List   Diagnosis Date Noted   Demand ischemia (HCC) 09/28/2023   Anxiety disorder 09/28/2023   E coli bacteremia 09/28/2023   Sepsis secondary to UTI (HCC) 09/27/2023   UTI (urinary tract infection) 09/27/2023   NSTEMI (non-ST elevated myocardial infarction) (HCC) 09/27/2023   Acute kidney injury superimposed on chronic kidney disease 09/27/2023   HTN (hypertension) 09/27/2023   Acute metabolic encephalopathy 09/27/2023   Hyponatremia 09/27/2023   Shortness of breath 02/17/2015   Smoker 02/17/2015   Hyperlipidemia 02/17/2015   Osteoarthritis of both hips 02/17/2015   Fecal incontinence due to anorectal disorder 04/13/2014   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 04/13/2014    ONSET DATE: 3 months  approx  REFERRING DIAG: R26.89 (ICD-10-CM) - Balance disorder   THERAPY DIAG:  Abnormality of gait and mobility  Difficulty in walking, not elsewhere classified  Unsteadiness on feet  Muscle weakness (generalized)  Rationale for Evaluation and Treatment: Rehabilitation  SUBJECTIVE:  SUBJECTIVE STATEMENT:  Pt states overall she is doing good. Denies pain. Denies stumbles/falls. Pt confirms she has her MD appointment at 4:00pm today. Pt reports she wishes her appointments were closer together. Pt confirms she has her letter from last physical therapist to provide the MD today.   Pt accompanied by: self  PERTINENT HISTORY: HTN, HLD  From eval: Pt reports with her balance it is hard to explain. Pt has a lot of stumbles when she is turning around and is not very sure footed. Pt reports this has been going on for several months. Pt has not had any falls in the last 6 months but several stumbles. Pt reports it started with a UTI and it got very bad during this. Since then there has been a noticeable difference in her balance, speech and her over all and has had some difficulty with word finding. Pt has been doing a lot less and feels she needs to do more things.  Patient also reports she has a fear of bending down to pick things up at times although she is able to do it in the clinic she reports the other day she was too afraid to bend down to pick something up she needed off the ground and had to ask for assistance.  Edition patient feels she is unable to go up and down a full flight of steps and has absolute certainty of falling with here in her opinion.  PAIN:  Are you having pain? No  PRECAUTIONS: Fall  RED FLAGS: None   WEIGHT BEARING RESTRICTIONS: No  FALLS: Has patient fallen in last 6 months? No  and several stumbles   LIVING ENVIRONMENT: Lives with: lives with their family and and has a dog  Lives in: House/apartment Stairs: Yes: External: 2 steps; can reach both Has following equipment at home: None  PLOF: Independent and Independent with basic ADLs  PATIENT GOALS: Improve her overall mobility and safety with balance   OBJECTIVE:  Note: Objective measures were completed at Evaluation unless otherwise noted.  DIAGNOSTIC FINDINGS: n/a  COGNITION: Overall cognitive status: pt reports some memory and word finding impairment since UTI   SENSATION: Not tested    LOWER EXTREMITY ROM:     WFL for tasks assessed   LOWER EXTREMITY MMT:    MMT Right Eval Left Eval  Hip flexion 4+ 4+  Hip extension    Hip abduction 4+ 4+  Hip adduction 4+ 4+  Hip internal rotation    Hip external rotation    Knee flexion 4+ 4+  Knee extension 4+ 4+  Ankle dorsiflexion 4+ 4+  Ankle plantarflexion    Ankle inversion    Ankle eversion    (Blank rows = not tested)   TRANSFERS: Sit to stand: Complete Independence  Assistive device utilized: None     Stand to sit: Complete Independence  Assistive device utilized: None     Chair to chair: Complete Independence  Assistive device utilized: None       RAMP:  Not tested  CURB:  Not tested  STAIRS: Findings: Level of Assistance: Modified independence, Stair Negotiation Technique: Alternating Pattern  with Bilateral Rails, Number of Stairs: 4, Height of Stairs: 6 in   , and Comments: Is afraid of doing a flight of steps, feels as though she would fall.  GAIT: Findings: Distance walked: 50 ft and Comments: NBOS, unsteady at times   FUNCTIONAL TESTS:  5 times sit to stand: 15.69 sec  10 meter walk test: 12.99  sec = .76 m/s : 265 ft, mild imbalance at times, no AD  Dynamic Gait Index: Test visit 2      PATIENT SURVEYS:  ABC scale: The Activities-Specific Balance Confidence (ABC) Scale 0% 10 20 30  40 50 60 70 80 90  100% No confidence<->completely confident  "How confident are you that you will not lose your balance or become unsteady when you . . .   Date tested 06/29/24  Walk around the house 100%  2. Walk up or down stairs 100%  3. Bend over and pick up a slipper from in front of a closet floor 80%  4. Reach for a small can off a shelf at eye level 100%  5. Stand on tip toes and reach for something above your head 90%  6. Stand on a chair and reach for something 0%  7. Sweep the floor 100%  8. Walk outside the house to a car parked in the driveway 100%  9. Get into or out of a car 100%  10. Walk across a parking lot to the mall 70%  11. Walk up or down a ramp 80%  12. Walk in a crowded mall where people rapidly walk past you 100%  13. Are bumped into by people as you walk through the mall 80%  14. Step onto or off of an escalator while you are holding onto the railing 100%  15. Step onto or off an escalator while holding onto parcels such that you cannot hold onto the railing 100%  16. Walk outside on icy sidewalks 0%  Total: #/16 81.25%                                                                                                                                 TREATMENT DATE: 07/29/24   Assessed vitals upon arrival to clinic: HR on dynamap: 30bpm HR 31bpm on manual palpation   Pt states she cannot recall her medications from memory, but states she does use a pill box and refills it every week. Pt states the only thing she takes that is not prescribed is a hair and nail vitamin in the morning and 1 benadryl at night.  Therapist educated pt on normal HR ranges and her current HR. Pt states maybe that's why when I walk, I get so give out. Therapist educated pt to follow-up with MD at her appointment today regarding her HR and also recommended patient take all of her medications with her to the appointment to ensure she is taking the medications that are currently prescribed.   PATIENT  EDUCATION: Education details: POC Person educated: Patient Education method: Explanation Education comprehension: verbalized understanding   HOME EXERCISE PROGRAM: Access Code: [email or web address] URL: [email or web address] Date: 07/06/2024 Prepared by: Connell Kiss  Exercises - Sit to Stand with Hands on Knees  - 1 x daily - 7 x weekly - 2 sets - 5 reps - Standing March with Counter Support  -  1 x daily - 7 x weekly - 2 sets - 10 reps - Narrow Stance with Counter Support - Vertical Head Nods  - 1 x daily - 7 x weekly - 2 sets - 10 reps   GOALS: Goals reviewed with patient? No  SHORT TERM GOALS: Target date: 07/27/2024  Patient will be independent in home exercise program to improve strength/mobility for better functional independence with ADLs. Baseline: No HEP currently  Goal status: INITIAL  LONG TERM GOALS: Target date: 09/21/2024   1.  Patient will complete five times sit to stand test in < 13 seconds indicating an increased LE strength and improved balance. Baseline: 15.69 sec Goal status: INITIAL  2.  Patient will improve ABC scale score to 90%   to demonstrate statistically significant improvement in mobility and quality of life as it relates to their balance confidence.  Baseline: 81.25% Goal status: INITIAL   3.  Patient will increase Berg Balance score by > 6 points to demonstrate decreased fall risk during functional activities. Baseline: 46 Goal status: INITIAL   4.   Patient will improve DGI score by 4 points or greater to reduce fall risk and demonstrate improved dynamic balance. Baseline: 07/06/24: 13/24 Goal status: INITIAL  5.   Patient will increase 10 meter walk test to >1.60m/s as to improve gait speed for better community ambulation and to reduce fall risk. Baseline: .76 m/s Goal status: INITIAL  6.   Patient will increase 2 minute walk test distance to >400 ft for progression to community ambulator and improve gait ability Baseline: 265  ft Goal status: INITIAL   ASSESSMENT:  CLINICAL IMPRESSION:  Non-billed session due to patient's HR 30bpm to start session. Pt following up with her MD at 4:00pm today regarding this.  OBJECTIVE IMPAIRMENTS: Abnormal gait, decreased activity tolerance, decreased balance, decreased endurance, difficulty walking, and decreased strength.   ACTIVITY LIMITATIONS: standing, stairs, and locomotion level  PARTICIPATION LIMITATIONS: laundry, shopping, community activity, and yard work  PERSONAL FACTORS: Age and 1-2 comorbidities: HLD, HTN are also affecting patient's functional outcome.   REHAB POTENTIAL: Good  CLINICAL DECISION MAKING: Evolving/moderate complexity  EVALUATION COMPLEXITY: Moderate  PLAN:  PT FREQUENCY: 2x/week  PT DURATION: 12 weeks  PLANNED INTERVENTIONS: 97750- Physical Performance Testing, 97110-Therapeutic exercises, 97530- Therapeutic activity, 97112- Neuromuscular re-education, 97535- Self Care, [id number]- Manual therapy, [phone]- Gait training, Balance training, Stair training, DME instructions, and Moist heat  PLAN FOR NEXT SESSION:  *monitor HR during session* Follow-up on provided HEP  Ensure performing exercises correctly Dynamic gait including: Turning Horizontal head turns Change in gait speed Dynamic balance and B LE strengthening including: Step ups    Rett Stehlik, PT, DPT, NCS, CSRS Physical Therapist - [place]  [facility]  1:44 PM 07/29/24

## 2024-08-02 DIAGNOSIS — Z79899 Other long term (current) drug therapy: Secondary | ICD-10-CM | POA: Diagnosis not present

## 2024-08-02 NOTE — Therapy (Unsigned)
 OUTPATIENT PHYSICAL THERAPY NEURO TREATMENT   Patient Name: Brenda Wiley MRN: [id number] DOB:Jun 19, 1941, 83 y.o., female Today's Date: 08/03/2024   PCP:   Auston Reyes BIRCH, MD   REFERRING PROVIDER:   Auston Reyes BIRCH, MD    END OF SESSION:   PT End of Session - 08/03/24 1605     Visit Number 6    Number of Visits 24    Date for Recertification  09/21/24    Progress Note Due on Visit 10    PT Start Time 1445    PT Stop Time 1527    PT Time Calculation (min) 42 min    Behavior During Therapy [facility] for tasks assessed/performed                Past Medical History:  Diagnosis Date   Anal sphincter tear (healed) (old)-anterior 04/13/2014   Basal cell carcinoma    Chronic headaches    resolved   Depression    loss of spouse   Diverticulosis    Dizzy spells 02/23/2015   X 1year   HLD (hyperlipidemia)    HTN (hypertension) 09/27/2023   Past Surgical History:  Procedure Laterality Date   AUGMENTATION MAMMAPLASTY Bilateral    silicone implants put in over 30 yeras ago   CESAREAN SECTION     x 2   COLONOSCOPY     PARTIAL HYSTERECTOMY     TONSILLECTOMY     Patient Active Problem List   Diagnosis Date Noted   Demand ischemia (HCC) 09/28/2023   Anxiety disorder 09/28/2023   E coli bacteremia 09/28/2023   Sepsis secondary to UTI (HCC) 09/27/2023   UTI (urinary tract infection) 09/27/2023   NSTEMI (non-ST elevated myocardial infarction) (HCC) 09/27/2023   Acute kidney injury superimposed on chronic kidney disease 09/27/2023   HTN (hypertension) 09/27/2023   Acute metabolic encephalopathy 09/27/2023   Hyponatremia 09/27/2023   Shortness of breath 02/17/2015   Smoker 02/17/2015   Hyperlipidemia 02/17/2015   Osteoarthritis of both hips 02/17/2015   Fecal incontinence due to anorectal disorder 04/13/2014   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 04/13/2014    ONSET DATE: 3 months approx  REFERRING DIAG: R26.89 (ICD-10-CM) - Balance disorder   THERAPY DIAG:   Abnormality of gait and mobility  Difficulty in walking, not elsewhere classified  Unsteadiness on feet  Muscle weakness (generalized)  Rationale for Evaluation and Treatment: Rehabilitation  SUBJECTIVE:                                                                                                                                                                                             SUBJECTIVE STATEMENT:  Pt reports MD tooke her off a few medicines and she has been feeling a little better.   Pt accompanied by: self  PERTINENT HISTORY: HTN, HLD  From eval: Pt reports with her balance it is hard to explain. Pt has a lot of stumbles when she is turning around and is not very sure footed. Pt reports this has been going on for several months. Pt has not had any falls in the last 6 months but several stumbles. Pt reports it started with a UTI and it got very bad during this. Since then there has been a noticeable difference in her balance, speech and her over all and has had some difficulty with word finding. Pt has been doing a lot less and feels she needs to do more things.  Patient also reports she has a fear of bending down to pick things up at times although she is able to do it in the clinic she reports the other day she was too afraid to bend down to pick something up she needed off the ground and had to ask for assistance.  Edition patient feels she is unable to go up and down a full flight of steps and has absolute certainty of falling with here in her opinion.  PAIN:  Are you having pain? No  PRECAUTIONS: Fall  RED FLAGS: None   WEIGHT BEARING RESTRICTIONS: No  FALLS: Has patient fallen in last 6 months? No and several stumbles   LIVING ENVIRONMENT: Lives with: lives with their family and and has a dog  Lives in: House/apartment Stairs: Yes: External: 2 steps; can reach both Has following equipment at home: None  PLOF: Independent and Independent with basic  ADLs  PATIENT GOALS: Improve her overall mobility and safety with balance   OBJECTIVE:  Note: Objective measures were completed at Evaluation unless otherwise noted.  DIAGNOSTIC FINDINGS: n/a  COGNITION: Overall cognitive status: pt reports some memory and word finding impairment since UTI   SENSATION: Not tested    LOWER EXTREMITY ROM:     WFL for tasks assessed   LOWER EXTREMITY MMT:    MMT Right Eval Left Eval  Hip flexion 4+ 4+  Hip extension    Hip abduction 4+ 4+  Hip adduction 4+ 4+  Hip internal rotation    Hip external rotation    Knee flexion 4+ 4+  Knee extension 4+ 4+  Ankle dorsiflexion 4+ 4+  Ankle plantarflexion    Ankle inversion    Ankle eversion    (Blank rows = not tested)   TRANSFERS: Sit to stand: Complete Independence  Assistive device utilized: None     Stand to sit: Complete Independence  Assistive device utilized: None     Chair to chair: Complete Independence  Assistive device utilized: None       RAMP:  Not tested  CURB:  Not tested  STAIRS: Findings: Level of Assistance: Modified independence, Stair Negotiation Technique: Alternating Pattern  with Bilateral Rails, Number of Stairs: 4, Height of Stairs: 6 in   , and Comments: Is afraid of doing a flight of steps, feels as though she would fall.  GAIT: Findings: Distance walked: 50 ft and Comments: NBOS, unsteady at times   FUNCTIONAL TESTS:  5 times sit to stand: 15.69 sec  10 meter walk test: 12.99 sec = .76 m/s : 265 ft, mild imbalance at times, no AD  Dynamic Gait Index: Test visit 2      PATIENT SURVEYS:  ABC scale: The  Activities-Specific Balance Confidence (ABC) Scale 0% 10 20 30  40 50 60 70 80 90 100% No confidence<->completely confident  "How confident are you that you will not lose your balance or become unsteady when you . . .   Date tested 06/29/24  Walk around the house 100%  2. Walk up or down stairs 100%  3. Bend over and pick up a slipper from in  front of a closet floor 80%  4. Reach for a small can off a shelf at eye level 100%  5. Stand on tip toes and reach for something above your head 90%  6. Stand on a chair and reach for something 0%  7. Sweep the floor 100%  8. Walk outside the house to a car parked in the driveway 100%  9. Get into or out of a car 100%  10. Walk across a parking lot to the mall 70%  11. Walk up or down a ramp 80%  12. Walk in a crowded mall where people rapidly walk past you 100%  13. Are bumped into by people as you walk through the mall 80%  14. Step onto or off of an escalator while you are holding onto the railing 100%  15. Step onto or off an escalator while holding onto parcels such that you cannot hold onto the railing 100%  16. Walk outside on icy sidewalks 0%  Total: #/16 81.25%                                                                                                                                 TREATMENT DATE: 08/03/24   Assessed vitals upon arrival to clinic: HR on dynamap 64 BPM with 92 SPO2 - all below values using dynamap  BP: 164/81  mmHg    STS 2 x 10  HR 70 following   Square stepping - ant, lat and retro x 3 laps fatigue at end and a few minor LOB - HR 85 following   Gait with 2-1/2 pound ankle weights x 170 feet.  With increased fatigue toward end of bout patient's shuffling gait with more prominent  NMR: To facilitate reeducation of movement, balance, posture, coordination, and/or proprioception/kinesthetic sense.  Step taps no UE assist 3 x 10 HR 82 following  Airex adducted stance 3 x 30 sec HR 68 following   Airex step on and off laterally x 10 ea side  -With all exercises as patient fatigued her balance and stability deteriorated.  Unless otherwise stated, CGA was provided and gait belt donned in order to ensure pt safety  Pt required occasional rest breaks due fatigue, PT was attentive to when pt appeared to be tired or winded in order to prevent excessive  fatigue.   PATIENT EDUCATION: Education details: POC Person educated: Patient Education method: Explanation Education comprehension: verbalized understanding   HOME EXERCISE PROGRAM: Access Code: [email or web address] URL: [email or web address] Date: 07/06/2024 Prepared by: Connell Kiss  Exercises -  Sit to Stand with Hands on Knees  - 1 x daily - 7 x weekly - 2 sets - 5 reps - Standing March with Counter Support  - 1 x daily - 7 x weekly - 2 sets - 10 reps - Narrow Stance with Counter Support - Vertical Head Nods  - 1 x daily - 7 x weekly - 2 sets - 10 reps   GOALS: Goals reviewed with patient? No  SHORT TERM GOALS: Target date: 07/27/2024  Patient will be independent in home exercise program to improve strength/mobility for better functional independence with ADLs. Baseline: No HEP currently  Goal status: INITIAL  LONG TERM GOALS: Target date: 09/21/2024   1.  Patient will complete five times sit to stand test in < 13 seconds indicating an increased LE strength and improved balance. Baseline: 15.69 sec Goal status: INITIAL  2.  Patient will improve ABC scale score to 90%   to demonstrate statistically significant improvement in mobility and quality of life as it relates to their balance confidence.  Baseline: 81.25% Goal status: INITIAL   3.  Patient will increase Berg Balance score by > 6 points to demonstrate decreased fall risk during functional activities. Baseline: 46 Goal status: INITIAL   4.   Patient will improve DGI score by 4 points or greater to reduce fall risk and demonstrate improved dynamic balance. Baseline: 07/06/24: 13/24 Goal status: INITIAL  5.   Patient will increase 10 meter walk test to >1.20m/s as to improve gait speed for better community ambulation and to reduce fall risk. Baseline: .76 m/s Goal status: INITIAL  6.   Patient will increase 2 minute walk test distance to >400 ft for progression to community ambulator and improve gait  ability Baseline: 265 ft Goal status: INITIAL   ASSESSMENT:  CLINICAL IMPRESSION:   Patient presents with good motivation for completion of physical therapy activities.  Patient continues with close monitoring of her heart rate and heart rate levels were much improved today compared to previous session.  Patient's medication change by Dr. Jerona to have alleviated her bradycardic rhythm.  Patient still demonstrates significant instability with fatigue and we continue to target for therapy moving forward. Pt will continue to benefit from skilled physical therapy intervention to address impairments, improve QOL, and attain therapy goals.     OBJECTIVE IMPAIRMENTS: Abnormal gait, decreased activity tolerance, decreased balance, decreased endurance, difficulty walking, and decreased strength.   ACTIVITY LIMITATIONS: standing, stairs, and locomotion level  PARTICIPATION LIMITATIONS: laundry, shopping, community activity, and yard work  PERSONAL FACTORS: Age and 1-2 comorbidities: HLD, HTN are also affecting patient's functional outcome.   REHAB POTENTIAL: Good  CLINICAL DECISION MAKING: Evolving/moderate complexity  EVALUATION COMPLEXITY: Moderate  PLAN:  PT FREQUENCY: 2x/week  PT DURATION: 12 weeks  PLANNED INTERVENTIONS: 97750- Physical Performance Testing, 97110-Therapeutic exercises, 97530- Therapeutic activity, 97112- Neuromuscular re-education, 97535- Self Care, [id number]- Manual therapy, [phone]- Gait training, Balance training, Stair training, DME instructions, and Moist heat  PLAN FOR NEXT SESSION:  *monitor HR during session* Follow-up on provided HEP  Ensure performing exercises correctly Dynamic gait including: Turning Horizontal head turns Change in gait speed Dynamic balance and B LE strengthening including: Step ups     4:06 PM 08/03/24

## 2024-08-03 ENCOUNTER — Ambulatory Visit: Admitting: Physical Therapy

## 2024-08-03 DIAGNOSIS — R262 Difficulty in walking, not elsewhere classified: Secondary | ICD-10-CM

## 2024-08-03 DIAGNOSIS — R269 Unspecified abnormalities of gait and mobility: Secondary | ICD-10-CM | POA: Diagnosis not present

## 2024-08-03 DIAGNOSIS — M6281 Muscle weakness (generalized): Secondary | ICD-10-CM

## 2024-08-03 DIAGNOSIS — R2681 Unsteadiness on feet: Secondary | ICD-10-CM

## 2024-08-05 ENCOUNTER — Ambulatory Visit: Admitting: Physical Therapy

## 2024-08-05 DIAGNOSIS — R2681 Unsteadiness on feet: Secondary | ICD-10-CM

## 2024-08-05 DIAGNOSIS — R269 Unspecified abnormalities of gait and mobility: Secondary | ICD-10-CM

## 2024-08-05 DIAGNOSIS — R262 Difficulty in walking, not elsewhere classified: Secondary | ICD-10-CM

## 2024-08-05 DIAGNOSIS — M6281 Muscle weakness (generalized): Secondary | ICD-10-CM

## 2024-08-05 NOTE — Therapy (Signed)
 OUTPATIENT PHYSICAL THERAPY NEURO TREATMENT   Patient Name: Brenda Wiley MRN: [id number] DOB:Nov 07, 1940, 83 y.o., female Today's Date: 08/05/2024   PCP:   Auston Reyes BIRCH, MD   REFERRING PROVIDER:   Auston Reyes BIRCH, MD    END OF SESSION:   PT End of Session - 08/05/24 1548     Visit Number 7    Number of Visits 24    Date for Recertification  09/21/24    Progress Note Due on Visit 10    PT Start Time 1448    PT Stop Time 1530    PT Time Calculation (min) 42 min    Equipment Utilized During Treatment Gait belt    Activity Tolerance Patient tolerated treatment well    Behavior During Therapy [facility] for tasks assessed/performed                 Past Medical History:  Diagnosis Date   Anal sphincter tear (healed) (old)-anterior 04/13/2014   Basal cell carcinoma    Chronic headaches    resolved   Depression    loss of spouse   Diverticulosis    Dizzy spells 02/23/2015   X 1year   HLD (hyperlipidemia)    HTN (hypertension) 09/27/2023   Past Surgical History:  Procedure Laterality Date   AUGMENTATION MAMMAPLASTY Bilateral    silicone implants put in over 30 yeras ago   CESAREAN SECTION     x 2   COLONOSCOPY     PARTIAL HYSTERECTOMY     TONSILLECTOMY     Patient Active Problem List   Diagnosis Date Noted   Demand ischemia (HCC) 09/28/2023   Anxiety disorder 09/28/2023   E coli bacteremia 09/28/2023   Sepsis secondary to UTI (HCC) 09/27/2023   UTI (urinary tract infection) 09/27/2023   NSTEMI (non-ST elevated myocardial infarction) (HCC) 09/27/2023   Acute kidney injury superimposed on chronic kidney disease 09/27/2023   HTN (hypertension) 09/27/2023   Acute metabolic encephalopathy 09/27/2023   Hyponatremia 09/27/2023   Shortness of breath 02/17/2015   Smoker 02/17/2015   Hyperlipidemia 02/17/2015   Osteoarthritis of both hips 02/17/2015   Fecal incontinence due to anorectal disorder 04/13/2014   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 04/13/2014    ONSET  DATE: 3 months approx  REFERRING DIAG: R26.89 (ICD-10-CM) - Balance disorder   THERAPY DIAG:  Abnormality of gait and mobility  Difficulty in walking, not elsewhere classified  Unsteadiness on feet  Muscle weakness (generalized)  Rationale for Evaluation and Treatment: Rehabilitation  SUBJECTIVE:  SUBJECTIVE STATEMENT:  Pt states that she is doing okay, feeling a little short of breath. Has been feeling SOB all day.    Pt accompanied by: self  PERTINENT HISTORY: HTN, HLD  From eval: Pt reports with her balance it is hard to explain. Pt has a lot of stumbles when she is turning around and is not very sure footed. Pt reports this has been going on for several months. Pt has not had any falls in the last 6 months but several stumbles. Pt reports it started with a UTI and it got very bad during this. Since then there has been a noticeable difference in her balance, speech and her over all and has had some difficulty with word finding. Pt has been doing a lot less and feels she needs to do more things.  Patient also reports she has a fear of bending down to pick things up at times although she is able to do it in the clinic she reports the other day she was too afraid to bend down to pick something up she needed off the ground and had to ask for assistance.  Edition patient feels she is unable to go up and down a full flight of steps and has absolute certainty of falling with here in her opinion.  PAIN:  Are you having pain? No  PRECAUTIONS: Fall  RED FLAGS: None   WEIGHT BEARING RESTRICTIONS: No  FALLS: Has patient fallen in last 6 months? No and several stumbles   LIVING ENVIRONMENT: Lives with: lives with their family and and has a dog  Lives in: House/apartment Stairs: Yes: External: 2 steps;  can reach both Has following equipment at home: None  PLOF: Independent and Independent with basic ADLs  PATIENT GOALS: Improve her overall mobility and safety with balance   OBJECTIVE:  Note: Objective measures were completed at Evaluation unless otherwise noted.  DIAGNOSTIC FINDINGS: n/a  COGNITION: Overall cognitive status: pt reports some memory and word finding impairment since UTI   SENSATION: Not tested    LOWER EXTREMITY ROM:     WFL for tasks assessed   LOWER EXTREMITY MMT:    MMT Right Eval Left Eval  Hip flexion 4+ 4+  Hip extension    Hip abduction 4+ 4+  Hip adduction 4+ 4+  Hip internal rotation    Hip external rotation    Knee flexion 4+ 4+  Knee extension 4+ 4+  Ankle dorsiflexion 4+ 4+  Ankle plantarflexion    Ankle inversion    Ankle eversion    (Blank rows = not tested)   TRANSFERS: Sit to stand: Complete Independence  Assistive device utilized: None     Stand to sit: Complete Independence  Assistive device utilized: None     Chair to chair: Complete Independence  Assistive device utilized: None       RAMP:  Not tested  CURB:  Not tested  STAIRS: Findings: Level of Assistance: Modified independence, Stair Negotiation Technique: Alternating Pattern  with Bilateral Rails, Number of Stairs: 4, Height of Stairs: 6 in   , and Comments: Is afraid of doing a flight of steps, feels as though she would fall.  GAIT: Findings: Distance walked: 50 ft and Comments: NBOS, unsteady at times   FUNCTIONAL TESTS:  5 times sit to stand: 15.69 sec  10 meter walk test: 12.99 sec = .76 m/s : 265 ft, mild imbalance at times, no AD  Dynamic Gait Index: Test visit 2  PATIENT SURVEYS:  ABC scale: The Activities-Specific Balance Confidence (ABC) Scale 0% 10 20 30  40 50 60 70 80 90 100% No confidence<->completely confident  "How confident are you that you will not lose your balance or become unsteady when you . . .   Date tested 06/29/24  Walk  around the house 100%  2. Walk up or down stairs 100%  3. Bend over and pick up a slipper from in front of a closet floor 80%  4. Reach for a small can off a shelf at eye level 100%  5. Stand on tip toes and reach for something above your head 90%  6. Stand on a chair and reach for something 0%  7. Sweep the floor 100%  8. Walk outside the house to a car parked in the driveway 100%  9. Get into or out of a car 100%  10. Walk across a parking lot to the mall 70%  11. Walk up or down a ramp 80%  12. Walk in a crowded mall where people rapidly walk past you 100%  13. Are bumped into by people as you walk through the mall 80%  14. Step onto or off of an escalator while you are holding onto the railing 100%  15. Step onto or off an escalator while holding onto parcels such that you cannot hold onto the railing 100%  16. Walk outside on icy sidewalks 0%  Total: #/16 81.25%                                                                                                                                 TREATMENT DATE: 08/05/24   Assessed vitals upon arrival to clinic: HR on dynamap 69-86 BPM with 100 SPO2 - all below values using dynamap  BP: 138/94 (106)  mmHg   Gait 300' CGA-occasional minA for steadying; 87 bpm following, SOB noted. Pt reporting difficulty at previous session with gait with head turns. Pt demonstrating increased LOB when demonstrating task requiring minA for steadying.   STS 2 x 10 from green chair, no UE support;  HR 80-84 bpm following   Standing marches at support bar with UE support at bar 2x10 each LE; HR 80 bpm following  Stair stepping 2x15 each LE, 1 UE support d/t increased fatigue this date; 87 bpm following   Square stepping - ant, lat and retro x 4 laps, fatigue & SOB at end; few minor LOB requiring minA for steadying, especially with lateral stepping, backward stepping; HR 93-94 following   Unless otherwise stated, at least CGA was provided and gait belt  donned in order to ensure pt safety.   Pt required occasional rest breaks due fatigue, PT was attentive to when pt appeared to be tired or winded in order to prevent excessive fatigue.   PATIENT EDUCATION: Education details: POC Person educated: Patient Education method: Explanation Education comprehension: verbalized understanding   HOME EXERCISE PROGRAM: Access  Code: [email or web address] URL: [email or web address] Date: 07/06/2024 Prepared by: Connell Kiss  Exercises - Sit to Stand with Hands on Knees  - 1 x daily - 7 x weekly - 2 sets - 5 reps - Standing March with Counter Support  - 1 x daily - 7 x weekly - 2 sets - 10 reps - Narrow Stance with Counter Support - Vertical Head Nods  - 1 x daily - 7 x weekly - 2 sets - 10 reps   GOALS: Goals reviewed with patient? No  SHORT TERM GOALS: Target date: 07/27/2024  Patient will be independent in home exercise program to improve strength/mobility for better functional independence with ADLs. Baseline: No HEP currently  Goal status: INITIAL  LONG TERM GOALS: Target date: 09/21/2024   1.  Patient will complete five times sit to stand test in < 13 seconds indicating an increased LE strength and improved balance. Baseline: 15.69 sec Goal status: INITIAL  2.  Patient will improve ABC scale score to 90%   to demonstrate statistically significant improvement in mobility and quality of life as it relates to their balance confidence.  Baseline: 81.25% Goal status: INITIAL   3.  Patient will increase Berg Balance score by > 6 points to demonstrate decreased fall risk during functional activities. Baseline: 46 Goal status: INITIAL   4.   Patient will improve DGI score by 4 points or greater to reduce fall risk and demonstrate improved dynamic balance. Baseline: 07/06/24: 13/24 Goal status: INITIAL  5.   Patient will increase 10 meter walk test to >1.84m/s as to improve gait speed for better community ambulation and to reduce fall  risk. Baseline: .76 m/s Goal status: INITIAL  6.   Patient will increase 2 minute walk test distance to >400 ft for progression to community ambulator and improve gait ability Baseline: 265 ft Goal status: INITIAL   ASSESSMENT:  CLINICAL IMPRESSION:   Patient presents with good motivation for completion of physical therapy activities. Continued close monitoring of her heart rate with actvity and heart rate levels continued to show improvement compared to previous sessions. Pt continues to demonstrate increased instability with increased fatigue. Pt with increased fatigue, SOB compared to previous sessions requiring additional UE support during stair stepping activity. Pt will continue to benefit from skilled physical therapy intervention to address impairments, improve QOL, and attain therapy goals.     OBJECTIVE IMPAIRMENTS: Abnormal gait, decreased activity tolerance, decreased balance, decreased endurance, difficulty walking, and decreased strength.   ACTIVITY LIMITATIONS: standing, stairs, and locomotion level  PARTICIPATION LIMITATIONS: laundry, shopping, community activity, and yard work  PERSONAL FACTORS: Age and 1-2 comorbidities: HLD, HTN are also affecting patient's functional outcome.   REHAB POTENTIAL: Good  CLINICAL DECISION MAKING: Evolving/moderate complexity  EVALUATION COMPLEXITY: Moderate  PLAN:  PT FREQUENCY: 2x/week  PT DURATION: 12 weeks  PLANNED INTERVENTIONS: 97750- Physical Performance Testing, 97110-Therapeutic exercises, 97530- Therapeutic activity, 97112- Neuromuscular re-education, 97535- Self Care, [id number]- Manual therapy, [phone]- Gait training, Balance training, Stair training, DME instructions, and Moist heat  PLAN FOR NEXT SESSION:  *monitor HR during session* Dynamic gait including: Turning Horizontal head turns Change in gait speed Dynamic balance and B LE strengthening including: Step ups    Chiquita Silvan, SPT Physical Therapy  Student - [place]  [facility]  3:50 PM 08/05/24

## 2024-08-10 ENCOUNTER — Ambulatory Visit: Admitting: Physical Therapy

## 2024-08-10 DIAGNOSIS — R2681 Unsteadiness on feet: Secondary | ICD-10-CM

## 2024-08-10 DIAGNOSIS — M6281 Muscle weakness (generalized): Secondary | ICD-10-CM

## 2024-08-10 DIAGNOSIS — R269 Unspecified abnormalities of gait and mobility: Secondary | ICD-10-CM

## 2024-08-10 DIAGNOSIS — R262 Difficulty in walking, not elsewhere classified: Secondary | ICD-10-CM

## 2024-08-10 NOTE — Therapy (Signed)
 OUTPATIENT PHYSICAL THERAPY NEURO TREATMENT   Patient Name: Brenda Wiley MRN: [id number] DOB:May 08, 1941, 83 y.o., female Today's Date: 08/10/2024   PCP:   Auston Reyes BIRCH, MD   REFERRING PROVIDER:   Auston Reyes BIRCH, MD    END OF SESSION:   PT End of Session - 08/10/24 1530     Visit Number 8    Number of Visits 24    Date for Recertification  09/21/24    Progress Note Due on Visit 10    PT Start Time 1447    PT Stop Time 1527    PT Time Calculation (min) 40 min    Equipment Utilized During Treatment Gait belt    Activity Tolerance Patient tolerated treatment well    Behavior During Therapy [facility] for tasks assessed/performed                  Past Medical History:  Diagnosis Date   Anal sphincter tear (healed) (old)-anterior 04/13/2014   Basal cell carcinoma    Chronic headaches    resolved   Depression    loss of spouse   Diverticulosis    Dizzy spells 02/23/2015   X 1year   HLD (hyperlipidemia)    HTN (hypertension) 09/27/2023   Past Surgical History:  Procedure Laterality Date   AUGMENTATION MAMMAPLASTY Bilateral    silicone implants put in over 30 yeras ago   CESAREAN SECTION     x 2   COLONOSCOPY     PARTIAL HYSTERECTOMY     TONSILLECTOMY     Patient Active Problem List   Diagnosis Date Noted   Demand ischemia (HCC) 09/28/2023   Anxiety disorder 09/28/2023   E coli bacteremia 09/28/2023   Sepsis secondary to UTI (HCC) 09/27/2023   UTI (urinary tract infection) 09/27/2023   NSTEMI (non-ST elevated myocardial infarction) (HCC) 09/27/2023   Acute kidney injury superimposed on chronic kidney disease 09/27/2023   HTN (hypertension) 09/27/2023   Acute metabolic encephalopathy 09/27/2023   Hyponatremia 09/27/2023   Shortness of breath 02/17/2015   Smoker 02/17/2015   Hyperlipidemia 02/17/2015   Osteoarthritis of both hips 02/17/2015   Fecal incontinence due to anorectal disorder 04/13/2014   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 04/13/2014     ONSET DATE: 3 months approx  REFERRING DIAG: R26.89 (ICD-10-CM) - Balance disorder   THERAPY DIAG:  No diagnosis found.  Rationale for Evaluation and Treatment: Rehabilitation  SUBJECTIVE:  SUBJECTIVE STATEMENT:  Pt states that she is doing okay, no changes, sees MD for follow up tomorrow.    Pt accompanied by: self  PERTINENT HISTORY: HTN, HLD  From eval: Pt reports with her balance it is hard to explain. Pt has a lot of stumbles when she is turning around and is not very sure footed. Pt reports this has been going on for several months. Pt has not had any falls in the last 6 months but several stumbles. Pt reports it started with a UTI and it got very bad during this. Since then there has been a noticeable difference in her balance, speech and her over all and has had some difficulty with word finding. Pt has been doing a lot less and feels she needs to do more things.  Patient also reports she has a fear of bending down to pick things up at times although she is able to do it in the clinic she reports the other day she was too afraid to bend down to pick something up she needed off the ground and had to ask for assistance.  Edition patient feels she is unable to go up and down a full flight of steps and has absolute certainty of falling with here in her opinion.  PAIN:  Are you having pain? No  PRECAUTIONS: Fall  RED FLAGS: None   WEIGHT BEARING RESTRICTIONS: No  FALLS: Has patient fallen in last 6 months? No and several stumbles   LIVING ENVIRONMENT: Lives with: lives with their family and and has a dog  Lives in: House/apartment Stairs: Yes: External: 2 steps; can reach both Has following equipment at home: None  PLOF: Independent and Independent with basic ADLs  PATIENT GOALS:  Improve her overall mobility and safety with balance   OBJECTIVE:  Note: Objective measures were completed at Evaluation unless otherwise noted.  DIAGNOSTIC FINDINGS: n/a  COGNITION: Overall cognitive status: pt reports some memory and word finding impairment since UTI   SENSATION: Not tested    LOWER EXTREMITY ROM:     WFL for tasks assessed   LOWER EXTREMITY MMT:    MMT Right Eval Left Eval  Hip flexion 4+ 4+  Hip extension    Hip abduction 4+ 4+  Hip adduction 4+ 4+  Hip internal rotation    Hip external rotation    Knee flexion 4+ 4+  Knee extension 4+ 4+  Ankle dorsiflexion 4+ 4+  Ankle plantarflexion    Ankle inversion    Ankle eversion    (Blank rows = not tested)   TRANSFERS: Sit to stand: Complete Independence  Assistive device utilized: None     Stand to sit: Complete Independence  Assistive device utilized: None     Chair to chair: Complete Independence  Assistive device utilized: None       RAMP:  Not tested  CURB:  Not tested  STAIRS: Findings: Level of Assistance: Modified independence, Stair Negotiation Technique: Alternating Pattern  with Bilateral Rails, Number of Stairs: 4, Height of Stairs: 6 in   , and Comments: Is afraid of doing a flight of steps, feels as though she would fall.  GAIT: Findings: Distance walked: 50 ft and Comments: NBOS, unsteady at times   FUNCTIONAL TESTS:  5 times sit to stand: 15.69 sec  10 meter walk test: 12.99 sec = .76 m/s : 265 ft, mild imbalance at times, no AD  Dynamic Gait Index: Test visit 2      PATIENT SURVEYS:  ABC  scale: The Activities-Specific Balance Confidence (ABC) Scale 0% 10 20 30  40 50 60 70 80 90 100% No confidence<->completely confident  "How confident are you that you will not lose your balance or become unsteady when you . . .   Date tested 06/29/24  Walk around the house 100%  2. Walk up or down stairs 100%  3. Bend over and pick up a slipper from in front of a closet floor  80%  4. Reach for a small can off a shelf at eye level 100%  5. Stand on tip toes and reach for something above your head 90%  6. Stand on a chair and reach for something 0%  7. Sweep the floor 100%  8. Walk outside the house to a car parked in the driveway 100%  9. Get into or out of a car 100%  10. Walk across a parking lot to the mall 70%  11. Walk up or down a ramp 80%  12. Walk in a crowded mall where people rapidly walk past you 100%  13. Are bumped into by people as you walk through the mall 80%  14. Step onto or off of an escalator while you are holding onto the railing 100%  15. Step onto or off an escalator while holding onto parcels such that you cannot hold onto the railing 100%  16. Walk outside on icy sidewalks 0%  Total: #/16 81.25%                                                                                                                                 TREATMENT DATE: 08/10/24   Assessed vitals upon arrival to clinic: HR 93 upon walking into clinic and 83 after nustep activities ( 2 min post)   TA- To improve functional movements patterns for everyday tasks   Nustep rolling hills x 8 min L 3-7 for UE and LE reciprocal movement training and for LE endurance   STS 2 x 10 from green chair, no UE support;  HR 80-84 bpm following   LAQ 2 x 10 reps ea LE with 3# AW   Standing marches at support bar with UE support at bar 2x10 each LE; with 3# AW donned   Sidestepping with UE support and 3# AW 2 x 15 ea direction   NMR: To facilitate reeducation of movement, balance, posture, coordination, and/or proprioception/kinesthetic sense.  On airex pad in adducted stance 3 x 30 sec ea   Forward and retro gait in // bars x 5 laps, cues to aim for toe contact in the retro gait to prevent only using heels and causing post LOB.   Unless otherwise stated, at least CGA was provided and gait belt donned in order to ensure pt safety.   Pt required occasional rest breaks due  fatigue, PT was attentive to when pt appeared to be tired or winded in order to prevent excessive fatigue.  PATIENT EDUCATION: Education details: POC Person educated: Patient Education method: Explanation Education comprehension: verbalized understanding   HOME EXERCISE PROGRAM: Access Code: [email or web address] URL: [email or web address] Date: 07/06/2024 Prepared by: Connell Kiss  Exercises - Sit to Stand with Hands on Knees  - 1 x daily - 7 x weekly - 2 sets - 5 reps - Standing March with Counter Support  - 1 x daily - 7 x weekly - 2 sets - 10 reps - Narrow Stance with Counter Support - Vertical Head Nods  - 1 x daily - 7 x weekly - 2 sets - 10 reps   GOALS: Goals reviewed with patient? No  SHORT TERM GOALS: Target date: 07/27/2024  Patient will be independent in home exercise program to improve strength/mobility for better functional independence with ADLs. Baseline: No HEP currently  Goal status: INITIAL  LONG TERM GOALS: Target date: 09/21/2024   1.  Patient will complete five times sit to stand test in < 13 seconds indicating an increased LE strength and improved balance. Baseline: 15.69 sec Goal status: INITIAL  2.  Patient will improve ABC scale score to 90%   to demonstrate statistically significant improvement in mobility and quality of life as it relates to their balance confidence.  Baseline: 81.25% Goal status: INITIAL   3.  Patient will increase Berg Balance score by > 6 points to demonstrate decreased fall risk during functional activities. Baseline: 46 Goal status: INITIAL   4.   Patient will improve DGI score by 4 points or greater to reduce fall risk and demonstrate improved dynamic balance. Baseline: 07/06/24: 13/24 Goal status: INITIAL  5.   Patient will increase 10 meter walk test to >1.4m/s as to improve gait speed for better community ambulation and to reduce fall risk. Baseline: .76 m/s Goal status: INITIAL  6.   Patient will increase 2  minute walk test distance to >400 ft for progression to community ambulator and improve gait ability Baseline: 265 ft Goal status: INITIAL   ASSESSMENT:  CLINICAL IMPRESSION:   Patient presents with good motivation for completion of physical therapy activities.  Pt continues to demonstrate increased instability with increased fatigue. Pt with increased fatigue, SOB compared to previous sessions requiring additional rest breaks at times but also important to note patient working at increased workload due to improved heart rate. Pt will continue to benefit from skilled physical therapy intervention to address impairments, improve QOL, and attain therapy goals.     OBJECTIVE IMPAIRMENTS: Abnormal gait, decreased activity tolerance, decreased balance, decreased endurance, difficulty walking, and decreased strength.   ACTIVITY LIMITATIONS: standing, stairs, and locomotion level  PARTICIPATION LIMITATIONS: laundry, shopping, community activity, and yard work  PERSONAL FACTORS: Age and 1-2 comorbidities: HLD, HTN are also affecting patient's functional outcome.   REHAB POTENTIAL: Good  CLINICAL DECISION MAKING: Evolving/moderate complexity  EVALUATION COMPLEXITY: Moderate  PLAN:  PT FREQUENCY: 2x/week  PT DURATION: 12 weeks  PLANNED INTERVENTIONS: 97750- Physical Performance Testing, 97110-Therapeutic exercises, 97530- Therapeutic activity, 97112- Neuromuscular re-education, 97535- Self Care, [id number]- Manual therapy, [phone]- Gait training, Balance training, Stair training, DME instructions, and Moist heat  PLAN FOR NEXT SESSION:  *monitor HR during session* Dynamic gait including: Turning Horizontal head turns Change in gait speed Dynamic balance and B LE strengthening including: Step ups    Chiquita Silvan, SPT Physical Therapy Student -   [facility]  3:31 PM 08/10/24

## 2024-08-11 DIAGNOSIS — F339 Major depressive disorder, recurrent, unspecified: Secondary | ICD-10-CM | POA: Diagnosis not present

## 2024-08-11 DIAGNOSIS — G894 Chronic pain syndrome: Secondary | ICD-10-CM | POA: Diagnosis not present

## 2024-08-11 DIAGNOSIS — E7800 Pure hypercholesterolemia, unspecified: Secondary | ICD-10-CM | POA: Diagnosis not present

## 2024-08-11 DIAGNOSIS — R7303 Prediabetes: Secondary | ICD-10-CM | POA: Diagnosis not present

## 2024-08-11 DIAGNOSIS — F3341 Major depressive disorder, recurrent, in partial remission: Secondary | ICD-10-CM | POA: Diagnosis not present

## 2024-08-11 DIAGNOSIS — J431 Panlobular emphysema: Secondary | ICD-10-CM | POA: Diagnosis not present

## 2024-08-11 DIAGNOSIS — N1832 Chronic kidney disease, stage 3b: Secondary | ICD-10-CM | POA: Diagnosis not present

## 2024-08-12 ENCOUNTER — Ambulatory Visit

## 2024-08-12 DIAGNOSIS — M6281 Muscle weakness (generalized): Secondary | ICD-10-CM

## 2024-08-12 DIAGNOSIS — R269 Unspecified abnormalities of gait and mobility: Secondary | ICD-10-CM | POA: Diagnosis not present

## 2024-08-12 DIAGNOSIS — R2681 Unsteadiness on feet: Secondary | ICD-10-CM

## 2024-08-12 DIAGNOSIS — R262 Difficulty in walking, not elsewhere classified: Secondary | ICD-10-CM

## 2024-08-12 NOTE — Therapy (Signed)
 OUTPATIENT PHYSICAL THERAPY NEURO TREATMENT   Patient Name: Brenda Wiley MRN: [id number] DOB:November 16, 1940, 83 y.o., female Today's Date: 08/12/2024   PCP:   Auston Reyes BIRCH, MD   REFERRING PROVIDER:   Auston Reyes BIRCH, MD    END OF SESSION:   PT End of Session - 08/12/24 1408     Visit Number 9    Number of Visits 24    Date for Recertification  09/21/24    Progress Note Due on Visit 10    PT Start Time 1403    PT Stop Time 1446    PT Time Calculation (Wiley) 43 Wiley    Equipment Utilized During Treatment Gait belt    Activity Tolerance Patient tolerated treatment well    Behavior During Therapy [facility] for tasks assessed/performed                  Past Medical History:  Diagnosis Date   Anal sphincter tear (healed) (old)-anterior 04/13/2014   Basal cell carcinoma    Chronic headaches    resolved   Depression    loss of spouse   Diverticulosis    Dizzy spells 02/23/2015   X 1year   HLD (hyperlipidemia)    HTN (hypertension) 09/27/2023   Past Surgical History:  Procedure Laterality Date   AUGMENTATION MAMMAPLASTY Bilateral    silicone implants put in over 30 yeras ago   CESAREAN SECTION     x 2   COLONOSCOPY     PARTIAL HYSTERECTOMY     TONSILLECTOMY     Patient Active Problem List   Diagnosis Date Noted   Demand ischemia (HCC) 09/28/2023   Anxiety disorder 09/28/2023   E coli bacteremia 09/28/2023   Sepsis secondary to UTI (HCC) 09/27/2023   UTI (urinary tract infection) 09/27/2023   NSTEMI (non-ST elevated myocardial infarction) (HCC) 09/27/2023   Acute kidney injury superimposed on chronic kidney disease 09/27/2023   HTN (hypertension) 09/27/2023   Acute metabolic encephalopathy 09/27/2023   Hyponatremia 09/27/2023   Shortness of breath 02/17/2015   Smoker 02/17/2015   Hyperlipidemia 02/17/2015   Osteoarthritis of both hips 02/17/2015   Fecal incontinence due to anorectal disorder 04/13/2014   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 04/13/2014     ONSET DATE: 3 months approx  REFERRING DIAG: R26.89 (ICD-10-CM) - Balance disorder   THERAPY DIAG:  Abnormality of gait and mobility  Difficulty in walking, not elsewhere classified  Unsteadiness on feet  Muscle weakness (generalized)  Rationale for Evaluation and Treatment: Rehabilitation  SUBJECTIVE:  SUBJECTIVE STATEMENT:  Pt reports doing well and feels like the therapy is helping. States balance is still her biggest issue.    Pt accompanied by: self  PERTINENT HISTORY: HTN, HLD  From eval: Pt reports with her balance it is hard to explain. Pt has a lot of stumbles when she is turning around and is not very sure footed. Pt reports this has been going on for several months. Pt has not had any falls in the last 6 months but several stumbles. Pt reports it started with a UTI and it got very bad during this. Since then there has been a noticeable difference in her balance, speech and her over all and has had some difficulty with word finding. Pt has been doing a lot less and feels she needs to do more things.  Patient also reports she has a fear of bending down to pick things up at times although she is able to do it in the clinic she reports the other day she was too afraid to bend down to pick something up she needed off the ground and had to ask for assistance.  Edition patient feels she is unable to go up and down a full flight of steps and has absolute certainty of falling with here in her opinion.  PAIN:  Are you having pain? No  PRECAUTIONS: Fall  RED FLAGS: None   WEIGHT BEARING RESTRICTIONS: No  FALLS: Has patient fallen in last 6 months? No and several stumbles   LIVING ENVIRONMENT: Lives with: lives with their family and and has a dog  Lives in: House/apartment Stairs: Yes:  External: 2 steps; can reach both Has following equipment at home: None  PLOF: Independent and Independent with basic ADLs  PATIENT GOALS: Improve her overall mobility and safety with balance   OBJECTIVE:  Note: Objective measures were completed at Evaluation unless otherwise noted.  DIAGNOSTIC FINDINGS: n/a  COGNITION: Overall cognitive status: pt reports some memory and word finding impairment since UTI   SENSATION: Not tested    LOWER EXTREMITY ROM:     WFL for tasks assessed   LOWER EXTREMITY MMT:    MMT Right Eval Left Eval  Hip flexion 4+ 4+  Hip extension    Hip abduction 4+ 4+  Hip adduction 4+ 4+  Hip internal rotation    Hip external rotation    Knee flexion 4+ 4+  Knee extension 4+ 4+  Ankle dorsiflexion 4+ 4+  Ankle plantarflexion    Ankle inversion    Ankle eversion    (Blank rows = not tested)   TRANSFERS: Sit to stand: Complete Independence  Assistive device utilized: None     Stand to sit: Complete Independence  Assistive device utilized: None     Chair to chair: Complete Independence  Assistive device utilized: None       RAMP:  Not tested  CURB:  Not tested  STAIRS: Findings: Level of Assistance: Modified independence, Stair Negotiation Technique: Alternating Pattern  with Bilateral Rails, Number of Stairs: 4, Height of Stairs: 6 in   , and Comments: Is afraid of doing a flight of steps, feels as though she would fall.  GAIT: Findings: Distance walked: 50 ft and Comments: NBOS, unsteady at times   FUNCTIONAL TESTS:  5 times sit to stand: 15.69 sec  10 meter walk test: 12.99 sec = .76 m/s : 265 ft, mild imbalance at times, no AD  Dynamic Gait Index: Test visit 2      PATIENT  SURVEYS:  ABC scale: The Activities-Specific Balance Confidence (ABC) Scale 0% 10 20 30  40 50 60 70 80 90 100% No confidence<->completely confident  "How confident are you that you will not lose your balance or become unsteady when you . . .   Date  tested 06/29/24  Walk around the house 100%  2. Walk up or down stairs 100%  3. Bend over and pick up a slipper from in front of a closet floor 80%  4. Reach for a small can off a shelf at eye level 100%  5. Stand on tip toes and reach for something above your head 90%  6. Stand on a chair and reach for something 0%  7. Sweep the floor 100%  8. Walk outside the house to a car parked in the driveway 100%  9. Get into or out of a car 100%  10. Walk across a parking lot to the mall 70%  11. Walk up or down a ramp 80%  12. Walk in a crowded mall where people rapidly walk past you 100%  13. Are bumped into by people as you walk through the mall 80%  14. Step onto or off of an escalator while you are holding onto the railing 100%  15. Step onto or off an escalator while holding onto parcels such that you cannot hold onto the railing 100%  16. Walk outside on icy sidewalks 0%  Total: #/16 81.25%                                                                                                                                 TREATMENT DATE: 08/12/24      TA- To improve functional movements patterns for everyday tasks   Nustep  x 8 Wiley L 3-7 for UE and LE reciprocal movement training and for LE endurance   STS 2 x 10 from green chair, no UE support;  HR 93 bpm following      NMR: To facilitate reeducation of movement, balance, posture, coordination, and/or proprioception/kinesthetic sense.  Fwd- dynamic high knee march in // bars w/o UE support - down and back x 10 (patient reports as hard- difficulty with step height   Sidestepping w/o UE support - length of // bars x 3 (patient reports as easy) so added the following:    Side stepping over 1/2 foam roll (4) in // bars - down and back x 10 (patient very fatigued upon completion- requiring 1UE support to hovering hands) - HR= 97 bpm and DOE   Forward and retro gait in // bars x 5 laps, cues to aim for toe contact in the retro gait to  prevent only using heels and causing post LOB.  Forward and retro gait in // bars (stepping over 1/2 foam rolls)   Tandem walking in // bars- length of bars and back x 5 Tandem fwd/bwd in // bars -length of bars and back x5  Unless otherwise stated, at least CGA was provided and gait belt donned in order to ensure pt safety.   Pt required occasional rest breaks due fatigue, PT was attentive to when pt appeared to be tired or winded in order to prevent excessive fatigue.   PATIENT EDUCATION: Education details: POC Person educated: Patient Education method: Explanation Education comprehension: verbalized understanding   HOME EXERCISE PROGRAM: Access Code: [email or web address] URL: [email or web address] Date: 07/06/2024 Prepared by: Connell Kiss  Exercises - Sit to Stand with Hands on Knees  - 1 x daily - 7 x weekly - 2 sets - 5 reps - Standing March with Counter Support  - 1 x daily - 7 x weekly - 2 sets - 10 reps - Narrow Stance with Counter Support - Vertical Head Nods  - 1 x daily - 7 x weekly - 2 sets - 10 reps   GOALS: Goals reviewed with patient? No  SHORT TERM GOALS: Target date: 07/27/2024  Patient will be independent in home exercise program to improve strength/mobility for better functional independence with ADLs. Baseline: No HEP currently  Goal status: INITIAL  LONG TERM GOALS: Target date: 09/21/2024   1.  Patient will complete five times sit to stand test in < 13 seconds indicating an increased LE strength and improved balance. Baseline: 15.69 sec Goal status: INITIAL  2.  Patient will improve ABC scale score to 90%   to demonstrate statistically significant improvement in mobility and quality of life as it relates to their balance confidence.  Baseline: 81.25% Goal status: INITIAL   3.  Patient will increase Berg Balance score by > 6 points to demonstrate decreased fall risk during functional activities. Baseline: 46 Goal status: INITIAL   4.    Patient will improve DGI score by 4 points or greater to reduce fall risk and demonstrate improved dynamic balance. Baseline: 07/06/24: 13/24 Goal status: INITIAL  5.   Patient will increase 10 meter walk test to >1.59m/s as to improve gait speed for better community ambulation and to reduce fall risk. Baseline: .76 m/s Goal status: INITIAL  6.   Patient will increase 2 minute walk test distance to >400 ft for progression to community ambulator and improve gait ability Baseline: 265 ft Goal status: INITIAL   ASSESSMENT:  CLINICAL IMPRESSION:   Treatment focused on dynamic balance today. She was challenged today- some difficulty with high knee march walk and later with tandem standing. She exhibited more fatigue as session progressed yet able to continue with minimal rest. Patient reported feeling very challenged with activities and required at most CGA throughout session. Pt will continue to benefit from skilled physical therapy intervention to address impairments, improve QOL, and attain therapy goals.     OBJECTIVE IMPAIRMENTS: Abnormal gait, decreased activity tolerance, decreased balance, decreased endurance, difficulty walking, and decreased strength.   ACTIVITY LIMITATIONS: standing, stairs, and locomotion level  PARTICIPATION LIMITATIONS: laundry, shopping, community activity, and yard work  PERSONAL FACTORS: Age and 1-2 comorbidities: HLD, HTN are also affecting patient's functional outcome.   REHAB POTENTIAL: Good  CLINICAL DECISION MAKING: Evolving/moderate complexity  EVALUATION COMPLEXITY: Moderate  PLAN:  PT FREQUENCY: 2x/week  PT DURATION: 12 weeks  PLANNED INTERVENTIONS: 97750- Physical Performance Testing, 97110-Therapeutic exercises, 97530- Therapeutic activity, 97112- Neuromuscular re-education, 97535- Self Care, [id number]- Manual therapy, [id number]- Gait training, Balance training, Stair training, DME instructions, and Moist heat  PLAN FOR NEXT SESSION:   *monitor HR during session* Dynamic gait including: Turning Horizontal head turns Change in gait speed Dynamic balance  and B LE strengthening including: Step ups Progress report due next Visit   Chyrl London, PT Physical Therapist [place] at [facility]  4:08 PM 08/12/24

## 2024-08-17 ENCOUNTER — Ambulatory Visit: Admitting: Physical Therapy

## 2024-08-17 DIAGNOSIS — R269 Unspecified abnormalities of gait and mobility: Secondary | ICD-10-CM | POA: Diagnosis not present

## 2024-08-17 DIAGNOSIS — M6281 Muscle weakness (generalized): Secondary | ICD-10-CM

## 2024-08-17 DIAGNOSIS — R2681 Unsteadiness on feet: Secondary | ICD-10-CM

## 2024-08-17 DIAGNOSIS — R262 Difficulty in walking, not elsewhere classified: Secondary | ICD-10-CM

## 2024-08-17 NOTE — Therapy (Unsigned)
 OUTPATIENT PHYSICAL THERAPY NEURO TREATMENT/ Physical Therapy Progress Note   Dates of reporting period  06/29/24   to   08/17/24       Patient Name: Brenda Wiley MRN: [id number] DOB:Jul 19, 1941, 83 y.o., female Today's Date: 08/18/2024   PCP:   Auston Reyes BIRCH, MD   REFERRING PROVIDER:   Auston Reyes BIRCH, MD    END OF SESSION:   PT End of Session - 08/17/24 1502     Visit Number 10    Number of Visits 24    Date for Recertification  09/21/24    Progress Note Due on Visit 10    PT Start Time 1448    PT Stop Time 1526    PT Time Calculation (min) 38 min    Equipment Utilized During Treatment Gait belt    Activity Tolerance Patient tolerated treatment well    Behavior During Therapy [facility] for tasks assessed/performed                   Past Medical History:  Diagnosis Date   Anal sphincter tear (healed) (old)-anterior 04/13/2014   Basal cell carcinoma    Chronic headaches    resolved   Depression    loss of spouse   Diverticulosis    Dizzy spells 02/23/2015   X 1year   HLD (hyperlipidemia)    HTN (hypertension) 09/27/2023   Past Surgical History:  Procedure Laterality Date   AUGMENTATION MAMMAPLASTY Bilateral    silicone implants put in over 30 yeras ago   CESAREAN SECTION     x 2   COLONOSCOPY     PARTIAL HYSTERECTOMY     TONSILLECTOMY     Patient Active Problem List   Diagnosis Date Noted   Demand ischemia (HCC) 09/28/2023   Anxiety disorder 09/28/2023   E coli bacteremia 09/28/2023   Sepsis secondary to UTI (HCC) 09/27/2023   UTI (urinary tract infection) 09/27/2023   NSTEMI (non-ST elevated myocardial infarction) (HCC) 09/27/2023   Acute kidney injury superimposed on chronic kidney disease 09/27/2023   HTN (hypertension) 09/27/2023   Acute metabolic encephalopathy 09/27/2023   Hyponatremia 09/27/2023   Shortness of breath 02/17/2015   Smoker 02/17/2015   Hyperlipidemia 02/17/2015   Osteoarthritis of both hips 02/17/2015   Fecal  incontinence due to anorectal disorder 04/13/2014   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 04/13/2014    ONSET DATE: 3 months approx  REFERRING DIAG: R26.89 (ICD-10-CM) - Balance disorder   THERAPY DIAG:  Abnormality of gait and mobility  Difficulty in walking, not elsewhere classified  Unsteadiness on feet  Muscle weakness (generalized)  Rationale for Evaluation and Treatment: Rehabilitation  SUBJECTIVE:  SUBJECTIVE STATEMENT:  Pt reports doing well and feels like the therapy is helping. States balance is still her biggest issue.    Pt accompanied by: self  PERTINENT HISTORY: HTN, HLD  From eval: Pt reports with her balance it is hard to explain. Pt has a lot of stumbles when she is turning around and is not very sure footed. Pt reports this has been going on for several months. Pt has not had any falls in the last 6 months but several stumbles. Pt reports it started with a UTI and it got very bad during this. Since then there has been a noticeable difference in her balance, speech and her over all and has had some difficulty with word finding. Pt has been doing a lot less and feels she needs to do more things.  Patient also reports she has a fear of bending down to pick things up at times although she is able to do it in the clinic she reports the other day she was too afraid to bend down to pick something up she needed off the ground and had to ask for assistance.  Edition patient feels she is unable to go up and down a full flight of steps and has absolute certainty of falling with here in her opinion.  PAIN:  Are you having pain? No  PRECAUTIONS: Fall  RED FLAGS: None   WEIGHT BEARING RESTRICTIONS: No  FALLS: Has patient fallen in last 6 months? No and several stumbles   LIVING ENVIRONMENT: Lives  with: lives with their family and and has a dog  Lives in: House/apartment Stairs: Yes: External: 2 steps; can reach both Has following equipment at home: None  PLOF: Independent and Independent with basic ADLs  PATIENT GOALS: Improve her overall mobility and safety with balance   OBJECTIVE:  Note: Objective measures were completed at Evaluation unless otherwise noted.  DIAGNOSTIC FINDINGS: n/a  COGNITION: Overall cognitive status: pt reports some memory and word finding impairment since UTI   SENSATION: Not tested    LOWER EXTREMITY ROM:     WFL for tasks assessed   LOWER EXTREMITY MMT:    MMT Right Eval Left Eval  Hip flexion 4+ 4+  Hip extension    Hip abduction 4+ 4+  Hip adduction 4+ 4+  Hip internal rotation    Hip external rotation    Knee flexion 4+ 4+  Knee extension 4+ 4+  Ankle dorsiflexion 4+ 4+  Ankle plantarflexion    Ankle inversion    Ankle eversion    (Blank rows = not tested)   TRANSFERS: Sit to stand: Complete Independence  Assistive device utilized: None     Stand to sit: Complete Independence  Assistive device utilized: None     Chair to chair: Complete Independence  Assistive device utilized: None       RAMP:  Not tested  CURB:  Not tested  STAIRS: Findings: Level of Assistance: Modified independence, Stair Negotiation Technique: Alternating Pattern  with Bilateral Rails, Number of Stairs: 4, Height of Stairs: 6 in   , and Comments: Is afraid of doing a flight of steps, feels as though she would fall.  GAIT: Findings: Distance walked: 50 ft and Comments: NBOS, unsteady at times   FUNCTIONAL TESTS:  5 times sit to stand: 15.69 sec  10 meter walk test: 12.99 sec = .76 m/s : 265 ft, mild imbalance at times, no AD  Dynamic Gait Index: Test visit 2      PATIENT  SURVEYS:  ABC scale: The Activities-Specific Balance Confidence (ABC) Scale 0% 10 20 30  40 50 60 70 80 90 100% No confidence<->completely confident  "How confident  are you that you will not lose your balance or become unsteady when you . . .   Date tested 06/29/24  Walk around the house 100%  2. Walk up or down stairs 100%  3. Bend over and pick up a slipper from in front of a closet floor 80%  4. Reach for a small can off a shelf at eye level 100%  5. Stand on tip toes and reach for something above your head 90%  6. Stand on a chair and reach for something 0%  7. Sweep the floor 100%  8. Walk outside the house to a car parked in the driveway 100%  9. Get into or out of a car 100%  10. Walk across a parking lot to the mall 70%  11. Walk up or down a ramp 80%  12. Walk in a crowded mall where people rapidly walk past you 100%  13. Are bumped into by people as you walk through the mall 80%  14. Step onto or off of an escalator while you are holding onto the railing 100%  15. Step onto or off an escalator while holding onto parcels such that you cannot hold onto the railing 100%  16. Walk outside on icy sidewalks 0%  Total: #/16 81.25%                                                                                                                                 TREATMENT DATE: 08/18/24    BP: 189/88  mmHg with HR 72 - cuff caused some pain so checked with manual after BP: 123/78  mmHg   TA- To improve functional movements patterns for everyday tasks   Nustep  x 6 min L 3-7 for UE and LE reciprocal movement training and for LE endurance   Patient demonstrates increased fall risk as noted by score of  47 /56 on Berg Balance Scale.  (<36= high risk for falls, close to 100%; 37-45 significant >80%; 46-51 moderate >50%; 52-55 lower >25%)   PT instructed pt in DGI. See below for results. Demonstrates increased fall risk with score of 15/24. (<19 indicates increased fall risk)   Five times Sit to Stand Test (FTSS)  TIME:11.2 sec    Cut off scores indicative of increased fall risk: >12 sec CVA, >16 sec PD, >13 sec vestibular (ANPTA Core Set of  Outcome Measures for Adults with Neurologic Conditions, 2018)  10 Meter Walk Test: Patient instructed to walk 10 meters (32.8 ft) as quickly and as safely as possible at their normal speed Results: .91 m/s   Cut off scores:   Household Ambulator  < 0.4 m/s  Limited Community Ambulator  0.4 - 0.8 m/s  [employer]  > 0.8 m/s  Increased fall  risk  < 1.55m/s  Crossing a Street  >1.43m/s  MCID 0.05 m/s (small), 0.13 m/s (moderate), 0.06 m/s (significant)  (ANPTA Core Set of Outcome Measures for Adults with Neurologic Conditions, 2018)        PATIENT EDUCATION: Education details: POC Person educated: Patient Education method: Explanation Education comprehension: verbalized understanding   HOME EXERCISE PROGRAM: Access Code: [email or web address] URL: [email or web address] Date: 07/06/2024 Prepared by: Connell Kiss  Exercises - Sit to Stand with Hands on Knees  - 1 x daily - 7 x weekly - 2 sets - 5 reps - Standing March with Counter Support  - 1 x daily - 7 x weekly - 2 sets - 10 reps - Narrow Stance with Counter Support - Vertical Head Nods  - 1 x daily - 7 x weekly - 2 sets - 10 reps   GOALS: Goals reviewed with patient? No  SHORT TERM GOALS: Target date: 07/27/2024  Patient will be independent in home exercise program to improve strength/mobility for better functional independence with ADLs. Baseline: No HEP currently  Goal status: INITIAL  LONG TERM GOALS: Target date: 09/21/2024   1.  Patient will complete five times sit to stand test in < 13 seconds indicating an increased LE strength and improved balance. Baseline: 15.69 sec 10/28:11.2 sec  Goal status: INITIAL  2.  Patient will improve ABC scale score to 90%   to demonstrate statistically significant improvement in mobility and quality of life as it relates to their balance confidence.  Baseline: 81.25% Goal status: INITIAL   3.  Patient will increase Berg Balance score by > 6 points to  demonstrate decreased fall risk during functional activities. Baseline: 46 Goal status: INITIAL   4.   Patient will improve DGI score by 4 points or greater to reduce fall risk and demonstrate improved dynamic balance. Baseline: 07/06/24: 13/24 Goal status: INITIAL  5.   Patient will increase 10 meter walk test to >1.62m/s as to improve gait speed for better community ambulation and to reduce fall risk. Baseline: .76 m/s Goal status: INITIAL  6.   Patient will increase 2 minute walk test distance to >400 ft for progression to community ambulator and improve gait ability Baseline: 265 ft Goal status: INITIAL   ASSESSMENT:  CLINICAL IMPRESSION:   Pt presents for progress note this date. Pt has made some progress since last assessment but likely is limited by her bradycardia that was only addressed and improved with medication change from MD. Pt has not been consistent with HEP but was encouraged to be more consistent with this to attenuate her progress. Pt will continue to benefit from skilled physical therapy intervention to address impairments, improve QOL, and attain therapy goals. Patient's condition has the potential to improve in response to therapy. Maximum improvement is yet to be obtained. The anticipated improvement is attainable and reasonable in a generally predictable time.       OBJECTIVE IMPAIRMENTS: Abnormal gait, decreased activity tolerance, decreased balance, decreased endurance, difficulty walking, and decreased strength.   ACTIVITY LIMITATIONS: standing, stairs, and locomotion level  PARTICIPATION LIMITATIONS: laundry, shopping, community activity, and yard work  PERSONAL FACTORS: Age and 1-2 comorbidities: HLD, HTN are also affecting patient's functional outcome.   REHAB POTENTIAL: Good  CLINICAL DECISION MAKING: Evolving/moderate complexity  EVALUATION COMPLEXITY: Moderate  PLAN:  PT FREQUENCY: 2x/week  PT DURATION: 12 weeks  PLANNED INTERVENTIONS:  97750- Physical Performance Testing, 97110-Therapeutic exercises, 97530- Therapeutic activity, [id number]- Neuromuscular re-education, 97535- Self Care, [id number]- Manual therapy, [id number]- Gait training,  Balance training, Stair training, DME instructions, and Moist heat  PLAN FOR NEXT SESSION:  *monitor HR during session* Dynamic gait including: Turning Horizontal head turns Change in gait speed Dynamic balance and B LE strengthening including: Step ups Progress report due next Visit   Note: Portions of this document were prepared using Dragon voice recognition software and although reviewed may contain unintentional dictation errors in syntax, grammar, or spelling.  Lonni KATHEE Gainer PT ,DPT Physical Therapist- [place]  [facility]   9:37 AM 08/18/24

## 2024-08-19 ENCOUNTER — Ambulatory Visit: Admitting: Physical Therapy

## 2024-08-19 DIAGNOSIS — R269 Unspecified abnormalities of gait and mobility: Secondary | ICD-10-CM | POA: Diagnosis not present

## 2024-08-19 DIAGNOSIS — R2681 Unsteadiness on feet: Secondary | ICD-10-CM

## 2024-08-19 DIAGNOSIS — M6281 Muscle weakness (generalized): Secondary | ICD-10-CM

## 2024-08-19 DIAGNOSIS — R262 Difficulty in walking, not elsewhere classified: Secondary | ICD-10-CM

## 2024-08-19 NOTE — Therapy (Addendum)
 OUTPATIENT PHYSICAL THERAPY NEURO TREATMENT  Patient Name: Brenda Wiley MRN: [id number] DOB:[birth year], 83 y.o., female Today's Date: 08/19/2024   PCP:   Auston Reyes BIRCH, MD   REFERRING PROVIDER:   Auston Reyes BIRCH, MD    END OF SESSION:   PT End of Session - 08/19/24 1550     Visit Number 11    Number of Visits 24    Date for Recertification  09/21/24    Progress Note Due on Visit 10    PT Start Time 1445    PT Stop Time 1527    PT Time Calculation (min) 42 min    Equipment Utilized During Treatment Gait belt    Activity Tolerance Patient tolerated treatment well    Behavior During Therapy [facility] for tasks assessed/performed                    Past Medical History:  Diagnosis Date   Anal sphincter tear (healed) (old)-anterior 04/13/2014   Basal cell carcinoma    Chronic headaches    resolved   Depression    loss of spouse   Diverticulosis    Dizzy spells 02/23/2015   X 1year   HLD (hyperlipidemia)    HTN (hypertension) 09/27/2023   Past Surgical History:  Procedure Laterality Date   AUGMENTATION MAMMAPLASTY Bilateral    silicone implants put in over 30 yeras ago   CESAREAN SECTION     x 2   COLONOSCOPY     PARTIAL HYSTERECTOMY     TONSILLECTOMY     Patient Active Problem List   Diagnosis Date Noted   Demand ischemia (HCC) 09/28/2023   Anxiety disorder 09/28/2023   E coli bacteremia 09/28/2023   Sepsis secondary to UTI (HCC) 09/27/2023   UTI (urinary tract infection) 09/27/2023   NSTEMI (non-ST elevated myocardial infarction) (HCC) 09/27/2023   Acute kidney injury superimposed on chronic kidney disease 09/27/2023   HTN (hypertension) 09/27/2023   Acute metabolic encephalopathy 09/27/2023   Hyponatremia 09/27/2023   Shortness of breath 02/17/2015   Smoker 02/17/2015   Hyperlipidemia 02/17/2015   Osteoarthritis of both hips 02/17/2015   Fecal incontinence due to anorectal disorder 04/13/2014   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 04/13/2014     ONSET DATE: 3 months approx  REFERRING DIAG: R26.89 (ICD-10-CM) - Balance disorder   THERAPY DIAG:  Abnormality of gait and mobility  Difficulty in walking, not elsewhere classified  Unsteadiness on feet  Muscle weakness (generalized)  Rationale for Evaluation and Treatment: Rehabilitation  SUBJECTIVE:  SUBJECTIVE STATEMENT:  Pt reports doing well and feels like the therapy is helping. States balance is still her biggest issue. Was a little lightheaded earlier today but is doing well now.    Pt accompanied by: self  PERTINENT HISTORY: HTN, HLD  From eval: Pt reports with her balance it is hard to explain. Pt has a lot of stumbles when she is turning around and is not very sure footed. Pt reports this has been going on for several months. Pt has not had any falls in the last 6 months but several stumbles. Pt reports it started with a UTI and it got very bad during this. Since then there has been a noticeable difference in her balance, speech and her over all and has had some difficulty with word finding. Pt has been doing a lot less and feels she needs to do more things.  Patient also reports she has a fear of bending down to pick things up at times although she is able to do it in the clinic she reports the other day she was too afraid to bend down to pick something up she needed off the ground and had to ask for assistance.  Edition patient feels she is unable to go up and down a full flight of steps and has absolute certainty of falling with here in her opinion.  PAIN:  Are you having pain? No  PRECAUTIONS: Fall  RED FLAGS: None   WEIGHT BEARING RESTRICTIONS: No  FALLS: Has patient fallen in last 6 months? No and several stumbles   LIVING ENVIRONMENT: Lives with: lives with their  family and and has a dog  Lives in: House/apartment Stairs: Yes: External: 2 steps; can reach both Has following equipment at home: None  PLOF: Independent and Independent with basic ADLs  PATIENT GOALS: Improve her overall mobility and safety with balance   OBJECTIVE:  Note: Objective measures were completed at Evaluation unless otherwise noted.  DIAGNOSTIC FINDINGS: n/a  COGNITION: Overall cognitive status: pt reports some memory and word finding impairment since UTI   SENSATION: Not tested    LOWER EXTREMITY ROM:     WFL for tasks assessed   LOWER EXTREMITY MMT:    MMT Right Eval Left Eval  Hip flexion 4+ 4+  Hip extension    Hip abduction 4+ 4+  Hip adduction 4+ 4+  Hip internal rotation    Hip external rotation    Knee flexion 4+ 4+  Knee extension 4+ 4+  Ankle dorsiflexion 4+ 4+  Ankle plantarflexion    Ankle inversion    Ankle eversion    (Blank rows = not tested)   TRANSFERS: Sit to stand: Complete Independence  Assistive device utilized: None     Stand to sit: Complete Independence  Assistive device utilized: None     Chair to chair: Complete Independence  Assistive device utilized: None       RAMP:  Not tested  CURB:  Not tested  STAIRS: Findings: Level of Assistance: Modified independence, Stair Negotiation Technique: Alternating Pattern  with Bilateral Rails, Number of Stairs: 4, Height of Stairs: 6 in   , and Comments: Is afraid of doing a flight of steps, feels as though she would fall.  GAIT: Findings: Distance walked: 50 ft and Comments: NBOS, unsteady at times   FUNCTIONAL TESTS:  5 times sit to stand: 15.69 sec  10 meter walk test: 12.99 sec = .76 m/s : 265 ft, mild imbalance at times, no AD  Dynamic  Gait Index: Test visit 2      PATIENT SURVEYS:  ABC scale: The Activities-Specific Balance Confidence (ABC) Scale 0% 10 20 30  40 50 60 70 80 90 100% No confidence<->completely confident  "How confident are you that you will  not lose your balance or become unsteady when you . . .   Date tested 06/29/24  Walk around the house 100%  2. Walk up or down stairs 100%  3. Bend over and pick up a slipper from in front of a closet floor 80%  4. Reach for a small can off a shelf at eye level 100%  5. Stand on tip toes and reach for something above your head 90%  6. Stand on a chair and reach for something 0%  7. Sweep the floor 100%  8. Walk outside the house to a car parked in the driveway 100%  9. Get into or out of a car 100%  10. Walk across a parking lot to the mall 70%  11. Walk up or down a ramp 80%  12. Walk in a crowded mall where people rapidly walk past you 100%  13. Are bumped into by people as you walk through the mall 80%  14. Step onto or off of an escalator while you are holding onto the railing 100%  15. Step onto or off an escalator while holding onto parcels such that you cannot hold onto the railing 100%  16. Walk outside on icy sidewalks 0%  Total: #/16 81.25%                                                                                                                                 TREATMENT DATE: 08/19/24   HR 70 - at start of session  TA- To improve functional movements patterns for everyday tasks   Nustep rolling hills x 6 min L 3-7 for UE and LE reciprocal movement training and for LE endurance   SLS practice x 30-45 sec ea, close CGA   Gait training - activities focussed on specific components of gait cycle and multimodal cueing for completion  Forward and retro gait in // bars x 5 laps - same with reactionary changes in direction x 2-3 min   In long hallway with changing from forward to retro gait x 5 min- instruction in deliberate reactions to LOB  - HR 69-70 following, fatigued and a little SOB   TA- To improve functional movements patterns for everyday tasks   STS x 12  - x 12 with 3KG ball  -x 12 with ball toss and catch in standing   Sidestepping in // bars 2 x 5  laps with 3# AW and UE - very fatiguing, long recovery between sets    Unless otherwise stated, at least CGA was provided and gait belt donned in order to ensure pt safety.   Pt required occasional rest breaks due fatigue, PT was attentive to  when pt appeared to be tired or winded in order to prevent excessive fatigue.    PATIENT EDUCATION: Education details: POC Person educated: Patient Education method: Explanation Education comprehension: verbalized understanding   HOME EXERCISE PROGRAM: Access Code: [email or web address] URL: [email or web address] Date: 07/06/2024 Prepared by: Connell Kiss  Exercises - Sit to Stand with Hands on Knees  - 1 x daily - 7 x weekly - 2 sets - 5 reps - Standing March with Counter Support  - 1 x daily - 7 x weekly - 2 sets - 10 reps - Narrow Stance with Counter Support - Vertical Head Nods  - 1 x daily - 7 x weekly - 2 sets - 10 reps   GOALS: Goals reviewed with patient? No  SHORT TERM GOALS: Target date: 07/27/2024  Patient will be independent in home exercise program to improve strength/mobility for better functional independence with ADLs. Baseline: No HEP currently  Goal status: INITIAL  LONG TERM GOALS: Target date: 09/21/2024   1.  Patient will complete five times sit to stand test in < 13 seconds indicating an increased LE strength and improved balance. Baseline: 15.69 sec 10/28:11.2 sec  Goal status: INITIAL  2.  Patient will improve ABC scale score to 90%   to demonstrate statistically significant improvement in mobility and quality of life as it relates to their balance confidence.  Baseline: 81.25% Goal status: INITIAL   3.  Patient will increase Berg Balance score by > 6 points to demonstrate decreased fall risk during functional activities. Baseline: 46 Goal status: INITIAL   4.   Patient will improve DGI score by 4 points or greater to reduce fall risk and demonstrate improved dynamic balance. Baseline: 07/06/24:  13/24 Goal status: INITIAL  5.   Patient will increase 10 meter walk test to >1.72m/s as to improve gait speed for better community ambulation and to reduce fall risk. Baseline: .76 m/s Goal status: INITIAL  6.   Patient will increase 2 minute walk test distance to >400 ft for progression to community ambulator and improve gait ability Baseline: 265 ft Goal status: INITIAL   ASSESSMENT:  CLINICAL IMPRESSION:   Patient presents with good motivation for completion of physical therapy activities.  Patient progresses with gait and variable planes of motions this date.  Did a lot of progress with program transitioning from forward to and from retrowalking.  With retrowalking patient initially had frequent loss of balance posterior but with increased practice and targeted interventions this date patient did show some improvement in balance with retrowalking although still needs further treatment to improve safety.  Patient very fatigued with lateral stepping with ankle weights this date will continue to progress this in future sessions as well to improve her endurance with functional activities.Pt will continue to benefit from skilled physical therapy intervention to address impairments, improve QOL, and attain therapy goals.    OBJECTIVE IMPAIRMENTS: Abnormal gait, decreased activity tolerance, decreased balance, decreased endurance, difficulty walking, and decreased strength.   ACTIVITY LIMITATIONS: standing, stairs, and locomotion level  PARTICIPATION LIMITATIONS: laundry, shopping, community activity, and yard work  PERSONAL FACTORS: Age and 1-2 comorbidities: HLD, HTN are also affecting patient's functional outcome.   REHAB POTENTIAL: Good  CLINICAL DECISION MAKING: Evolving/moderate complexity  EVALUATION COMPLEXITY: Moderate  PLAN:  PT FREQUENCY: 2x/week  PT DURATION: 12 weeks  PLANNED INTERVENTIONS: 97750- Physical Performance Testing, 97110-Therapeutic exercises, 97530-  Therapeutic activity, [id number]- Neuromuscular re-education, 97535- Self Care, [id number]- Manual therapy, [phone]- Gait training, Balance training, Stair training, DME instructions, and  Moist heat  PLAN FOR NEXT SESSION:  *monitor HR during session* Dynamic gait including: Turning Horizontal head turns Change in gait speed Retro transition Dynamic balance and B LE strengthening including: Step ups Sidestep with resistance     Note: Portions of this document were prepared using Dragon voice recognition software and although reviewed may contain unintentional dictation errors in syntax, grammar, or spelling.  Lonni KATHEE Gainer PT ,DPT Physical Therapist- [place]  [facility]   3:51 PM 08/19/24

## 2024-08-24 ENCOUNTER — Ambulatory Visit: Attending: Internal Medicine | Admitting: Physical Therapy

## 2024-08-24 DIAGNOSIS — R269 Unspecified abnormalities of gait and mobility: Secondary | ICD-10-CM | POA: Diagnosis not present

## 2024-08-24 DIAGNOSIS — M6281 Muscle weakness (generalized): Secondary | ICD-10-CM | POA: Insufficient documentation

## 2024-08-24 DIAGNOSIS — R262 Difficulty in walking, not elsewhere classified: Secondary | ICD-10-CM | POA: Insufficient documentation

## 2024-08-24 DIAGNOSIS — R2681 Unsteadiness on feet: Secondary | ICD-10-CM | POA: Diagnosis not present

## 2024-08-24 NOTE — Therapy (Cosign Needed Addendum)
 OUTPATIENT PHYSICAL THERAPY NEURO TREATMENT    Patient Name: Brenda Wiley MRN: [id number] DOB:11-Oct-1941, 83 y.o., female Today's Date: 08/24/2024   PCP:   Auston Reyes BIRCH, MD   REFERRING PROVIDER:   Auston Reyes BIRCH, MD    END OF SESSION:   PT End of Session - 08/24/24 1455     Visit Number 12    Number of Visits 24    Date for Recertification  09/21/24    Progress Note Due on Visit 10    PT Start Time 1448    PT Stop Time 1528    PT Time Calculation (min) 40 min    Equipment Utilized During Treatment Gait belt    Activity Tolerance Patient tolerated treatment well    Behavior During Therapy [facility] for tasks assessed/performed           Past Medical History:  Diagnosis Date   Anal sphincter tear (healed) (old)-anterior 04/13/2014   Basal cell carcinoma    Chronic headaches    resolved   Depression    loss of spouse   Diverticulosis    Dizzy spells 02/23/2015   X 1year   HLD (hyperlipidemia)    HTN (hypertension) 09/27/2023   Past Surgical History:  Procedure Laterality Date   AUGMENTATION MAMMAPLASTY Bilateral    silicone implants put in over 30 yeras ago   CESAREAN SECTION     x 2   COLONOSCOPY     PARTIAL HYSTERECTOMY     TONSILLECTOMY     Patient Active Problem List   Diagnosis Date Noted   Demand ischemia (HCC) 09/28/2023   Anxiety disorder 09/28/2023   E coli bacteremia 09/28/2023   Sepsis secondary to UTI (HCC) 09/27/2023   UTI (urinary tract infection) 09/27/2023   NSTEMI (non-ST elevated myocardial infarction) (HCC) 09/27/2023   Acute kidney injury superimposed on chronic kidney disease 09/27/2023   HTN (hypertension) 09/27/2023   Acute metabolic encephalopathy 09/27/2023   Hyponatremia 09/27/2023   Shortness of breath 02/17/2015   Smoker 02/17/2015   Hyperlipidemia 02/17/2015   Osteoarthritis of both hips 02/17/2015   Fecal incontinence due to anorectal disorder 04/13/2014   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 04/13/2014    ONSET DATE: 3  months approx  REFERRING DIAG: R26.89 (ICD-10-CM) - Balance disorder   THERAPY DIAG:  Abnormality of gait and mobility  Difficulty in walking, not elsewhere classified  Unsteadiness on feet  Muscle weakness (generalized)  Rationale for Evaluation and Treatment: Rehabilitation  SUBJECTIVE:  SUBJECTIVE STATEMENT:  Pt reports no new updates, and felt good after last session. Pt continues to experience dizziness saying it happens when I stand too fast.   Pt accompanied by: self  PERTINENT HISTORY: HTN, HLD  From eval: Pt reports with her balance it is hard to explain. Pt has a lot of stumbles when she is turning around and is not very sure footed. Pt reports this has been going on for several months. Pt has not had any falls in the last 6 months but several stumbles. Pt reports it started with a UTI and it got very bad during this. Since then there has been a noticeable difference in her balance, speech and her over all and has had some difficulty with word finding. Pt has been doing a lot less and feels she needs to do more things.  Patient also reports she has a fear of bending down to pick things up at times although she is able to do it in the clinic she reports the other day she was too afraid to bend down to pick something up she needed off the ground and had to ask for assistance.  Edition patient feels she is unable to go up and down a full flight of steps and has absolute certainty of falling with here in her opinion.  PAIN:  Are you having pain? No  PRECAUTIONS: Fall  RED FLAGS: None   WEIGHT BEARING RESTRICTIONS: No  FALLS: Has patient fallen in last 6 months? No and several stumbles   LIVING ENVIRONMENT: Lives with: lives with their family and and has a dog  Lives in:  House/apartment Stairs: Yes: External: 2 steps; can reach both Has following equipment at home: None  PLOF: Independent and Independent with basic ADLs  PATIENT GOALS: Improve her overall mobility and safety with balance   OBJECTIVE:  Note: Objective measures were completed at Evaluation unless otherwise noted.  DIAGNOSTIC FINDINGS: n/a  COGNITION: Overall cognitive status: pt reports some memory and word finding impairment since UTI   SENSATION: Not tested    LOWER EXTREMITY ROM:     WFL for tasks assessed   LOWER EXTREMITY MMT:    MMT Right Eval Left Eval  Hip flexion 4+ 4+  Hip extension    Hip abduction 4+ 4+  Hip adduction 4+ 4+  Hip internal rotation    Hip external rotation    Knee flexion 4+ 4+  Knee extension 4+ 4+  Ankle dorsiflexion 4+ 4+  Ankle plantarflexion    Ankle inversion    Ankle eversion    (Blank rows = not tested)   TRANSFERS: Sit to stand: Complete Independence  Assistive device utilized: None     Stand to sit: Complete Independence  Assistive device utilized: None     Chair to chair: Complete Independence  Assistive device utilized: None       RAMP:  Not tested  CURB:  Not tested  STAIRS: Findings: Level of Assistance: Modified independence, Stair Negotiation Technique: Alternating Pattern  with Bilateral Rails, Number of Stairs: 4, Height of Stairs: 6 in   , and Comments: Is afraid of doing a flight of steps, feels as though she would fall.  GAIT: Findings: Distance walked: 50 ft and Comments: NBOS, unsteady at times   FUNCTIONAL TESTS:  5 times sit to stand: 15.69 sec  10 meter walk test: 12.99 sec = .76 m/s : 265 ft, mild imbalance at times, no AD  Dynamic Gait Index: Test visit 2  PATIENT SURVEYS:  ABC scale: The Activities-Specific Balance Confidence (ABC) Scale 0% 10 20 30  40 50 60 70 80 90 100% No confidence<->completely confident  "How confident are you that you will not lose your balance or become  unsteady when you . . .   Date tested 06/29/24  Walk around the house 100%  2. Walk up or down stairs 100%  3. Bend over and pick up a slipper from in front of a closet floor 80%  4. Reach for a small can off a shelf at eye level 100%  5. Stand on tip toes and reach for something above your head 90%  6. Stand on a chair and reach for something 0%  7. Sweep the floor 100%  8. Walk outside the house to a car parked in the driveway 100%  9. Get into or out of a car 100%  10. Walk across a parking lot to the mall 70%  11. Walk up or down a ramp 80%  12. Walk in a crowded mall where people rapidly walk past you 100%  13. Are bumped into by people as you walk through the mall 80%  14. Step onto or off of an escalator while you are holding onto the railing 100%  15. Step onto or off an escalator while holding onto parcels such that you cannot hold onto the railing 100%  16. Walk outside on icy sidewalks 0%  Total: #/16 81.25%                                                                                                                                 TREATMENT DATE: 08/24/24   HR 72 - at start of session  TA- To improve functional movements patterns for everyday tasks   Nustep x 6 min L 4 for UE and LE reciprocal movement training and for LE endurance  - Post nustep HR 78  Incline balance on foam wedge: 3 x 30 seconds  - Performed in // bars  Lateral step over w/ half foam roller x 10 in // bars -Progressed to lateral step over w/ hurdle onto foam airex bilateral 2 x 8   -Fatigue and SOB experienced after first round on airex, encouraged purse lip breathing (HR 89)  Gait training - activities focussed on specific components of gait cycle and multimodal cueing for completion  Forward and retro gait in // bars x 6 laps  - HR 80  In long hallway with changing from forward to retro gait x 3 rounds- instruction in deliberate reactions to LOB   -LOB following first round in retro  gait  Unless otherwise stated, at least CGA was provided and gait belt donned in order to ensure pt safety.   Pt required occasional rest breaks due fatigue, PT was attentive to when pt appeared to be tired or winded in order to prevent excessive fatigue. Pt instructed on pursed lip breathing to help  with SOB.    PATIENT EDUCATION: Education details: POC Person educated: Patient Education method: Explanation Education comprehension: verbalized understanding   HOME EXERCISE PROGRAM: Access Code: [email or web address] URL: [email or web address] Date: 07/06/2024 Prepared by: Connell Kiss  Exercises - Sit to Stand with Hands on Knees  - 1 x daily - 7 x weekly - 2 sets - 5 reps - Standing March with Counter Support  - 1 x daily - 7 x weekly - 2 sets - 10 reps - Narrow Stance with Counter Support - Vertical Head Nods  - 1 x daily - 7 x weekly - 2 sets - 10 reps   GOALS: Goals reviewed with patient? No  SHORT TERM GOALS: Target date: 07/27/2024  Patient will be independent in home exercise program to improve strength/mobility for better functional independence with ADLs. Baseline: No HEP currently  Goal status: INITIAL  LONG TERM GOALS: Target date: 09/21/2024   1.  Patient will complete five times sit to stand test in < 13 seconds indicating an increased LE strength and improved balance. Baseline: 15.69 sec 10/28:11.2 sec  Goal status: INITIAL  2.  Patient will improve ABC scale score to 90%   to demonstrate statistically significant improvement in mobility and quality of life as it relates to their balance confidence.  Baseline: 81.25% Goal status: INITIAL   3.  Patient will increase Berg Balance score by > 6 points to demonstrate decreased fall risk during functional activities. Baseline: 46 Goal status: INITIAL   4.   Patient will improve DGI score by 4 points or greater to reduce fall risk and demonstrate improved dynamic balance. Baseline: 07/06/24: 13/24 Goal  status: INITIAL  5.   Patient will increase 10 meter walk test to >1.50m/s as to improve gait speed for better community ambulation and to reduce fall risk. Baseline: .76 m/s Goal status: INITIAL  6.   Patient will increase 2 minute walk test distance to >400 ft for progression to community ambulator and improve gait ability Baseline: 265 ft Goal status: INITIAL   ASSESSMENT:  CLINICAL IMPRESSION:   Pt presents with good motivation to complete exercises today. Pt is still limited by fatigue and shortness of breath, which she believes is due to her medication. She has discontinued a medication without physician confirmation. Pt was encouraged to speak with physician or pharmacist to determine possible medication interactions. HR was monitored throughout activities, and pt was instructed on purse lip breathing after experiencing SOB following lateral hurdle steps. Pt had difficulty maintaining balance on foam incline, requiring verbal cues to continue to shift weight anteriorly. Anterior weight shift carryover during retro gait was minimal as she experienced one LOB that required righting from PT. Pt will continue to benefit from skilled physical therapy intervention to address impairments, improve QOL, and attain therapy goals.    OBJECTIVE IMPAIRMENTS: Abnormal gait, decreased activity tolerance, decreased balance, decreased endurance, difficulty walking, and decreased strength.   ACTIVITY LIMITATIONS: standing, stairs, and locomotion level  PARTICIPATION LIMITATIONS: laundry, shopping, community activity, and yard work  PERSONAL FACTORS: Age and 1-2 comorbidities: HLD, HTN are also affecting patient's functional outcome.   REHAB POTENTIAL: Good  CLINICAL DECISION MAKING: Evolving/moderate complexity  EVALUATION COMPLEXITY: Moderate  PLAN:  PT FREQUENCY: 2x/week  PT DURATION: 12 weeks  PLANNED INTERVENTIONS: 97750- Physical Performance Testing, 97110-Therapeutic exercises,  97530- Therapeutic activity, 97112- Neuromuscular re-education, 97535- Self Care, [id number]- Manual therapy, [id number]- Gait training, Balance training, Stair training, DME instructions, and Moist heat  PLAN FOR NEXT SESSION:  *monitor HR  during session* Dynamic gait including: Turning Horizontal head turns Change in gait speed Retro transition Dynamic balance and B LE strengthening including: Step ups Sidestep with resistance     Note: Portions of this document were prepared using Dragon voice recognition software and although reviewed may contain unintentional dictation errors in syntax, grammar, or spelling.  Documented by: Leonor Rode, SPT   This entire session was performed under direct supervision and direction of a licensed therapist/therapist assistant . I have personally read, edited and approve of the note as written.    This licensed clinician was present and actively directing care throughout the session at all times.  Lonni KATHEE Gainer PT ,DPT Physical Therapist- [facility]    2:56 PM 08/24/24

## 2024-08-26 ENCOUNTER — Ambulatory Visit: Admitting: Physical Therapy

## 2024-08-26 DIAGNOSIS — R269 Unspecified abnormalities of gait and mobility: Secondary | ICD-10-CM | POA: Diagnosis not present

## 2024-08-26 DIAGNOSIS — M6281 Muscle weakness (generalized): Secondary | ICD-10-CM

## 2024-08-26 DIAGNOSIS — R2681 Unsteadiness on feet: Secondary | ICD-10-CM

## 2024-08-26 DIAGNOSIS — R262 Difficulty in walking, not elsewhere classified: Secondary | ICD-10-CM

## 2024-08-26 NOTE — Therapy (Signed)
 OUTPATIENT PHYSICAL THERAPY NEURO TREATMENT    Patient Name: Brenda Wiley MRN: [id number] DOB:[birth year], 83 y.o., female Today's Date: 08/26/2024   PCP:   Auston Reyes BIRCH, MD   REFERRING PROVIDER:   Auston Reyes BIRCH, MD    END OF SESSION:   PT End of Session - 08/26/24 1429     Visit Number 13    Number of Visits 24    Date for Recertification  09/21/24    Progress Note Due on Visit 10    PT Start Time 1435    Equipment Utilized During Treatment Gait belt    Activity Tolerance Patient tolerated treatment well    Behavior During Therapy [facility] for tasks assessed/performed          Past Medical History:  Diagnosis Date   Anal sphincter tear (healed) (old)-anterior 04/13/2014   Basal cell carcinoma    Chronic headaches    resolved   Depression    loss of spouse   Diverticulosis    Dizzy spells 02/23/2015   X 1year   HLD (hyperlipidemia)    HTN (hypertension) 09/27/2023   Past Surgical History:  Procedure Laterality Date   AUGMENTATION MAMMAPLASTY Bilateral    silicone implants put in over 30 yeras ago   CESAREAN SECTION     x 2   COLONOSCOPY     PARTIAL HYSTERECTOMY     TONSILLECTOMY     Patient Active Problem List   Diagnosis Date Noted   Demand ischemia (HCC) 09/28/2023   Anxiety disorder 09/28/2023   E coli bacteremia 09/28/2023   Sepsis secondary to UTI (HCC) 09/27/2023   UTI (urinary tract infection) 09/27/2023   NSTEMI (non-ST elevated myocardial infarction) (HCC) 09/27/2023   Acute kidney injury superimposed on chronic kidney disease 09/27/2023   HTN (hypertension) 09/27/2023   Acute metabolic encephalopathy 09/27/2023   Hyponatremia 09/27/2023   Shortness of breath 02/17/2015   Smoker 02/17/2015   Hyperlipidemia 02/17/2015   Osteoarthritis of both hips 02/17/2015   Fecal incontinence due to anorectal disorder 04/13/2014   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 04/13/2014    ONSET DATE: 3 months approx  REFERRING DIAG: R26.89 (ICD-10-CM) - Balance  disorder   THERAPY DIAG:  Difficulty in walking, not elsewhere classified  Abnormality of gait and mobility  Unsteadiness on feet  Muscle weakness (generalized)  Rationale for Evaluation and Treatment: Rehabilitation  SUBJECTIVE:                                                                                                                                                                                             SUBJECTIVE STATEMENT:  Pt is  doing well, but still continues to experience dizziness. She has resumed a prior medication that has helped her feel more alert.    Pt accompanied by: self  PERTINENT HISTORY: HTN, HLD  From eval: Pt reports with her balance it is hard to explain. Pt has a lot of stumbles when she is turning around and is not very sure footed. Pt reports this has been going on for several months. Pt has not had any falls in the last 6 months but several stumbles. Pt reports it started with a UTI and it got very bad during this. Since then there has been a noticeable difference in her balance, speech and her over all and has had some difficulty with word finding. Pt has been doing a lot less and feels she needs to do more things.  Patient also reports she has a fear of bending down to pick things up at times although she is able to do it in the clinic she reports the other day she was too afraid to bend down to pick something up she needed off the ground and had to ask for assistance.  Edition patient feels she is unable to go up and down a full flight of steps and has absolute certainty of falling with here in her opinion.  PAIN:  Are you having pain? No  PRECAUTIONS: Fall  RED FLAGS: None   WEIGHT BEARING RESTRICTIONS: No  FALLS: Has patient fallen in last 6 months? No and several stumbles   LIVING ENVIRONMENT: Lives with: lives with their family and and has a dog  Lives in: House/apartment Stairs: Yes: External: 2 steps; can reach both Has following  equipment at home: None  PLOF: Independent and Independent with basic ADLs  PATIENT GOALS: Improve her overall mobility and safety with balance   OBJECTIVE:  Note: Objective measures were completed at Evaluation unless otherwise noted.  DIAGNOSTIC FINDINGS: n/a  COGNITION: Overall cognitive status: pt reports some memory and word finding impairment since UTI   SENSATION: Not tested    LOWER EXTREMITY ROM:     WFL for tasks assessed   LOWER EXTREMITY MMT:    MMT Right Eval Left Eval  Hip flexion 4+ 4+  Hip extension    Hip abduction 4+ 4+  Hip adduction 4+ 4+  Hip internal rotation    Hip external rotation    Knee flexion 4+ 4+  Knee extension 4+ 4+  Ankle dorsiflexion 4+ 4+  Ankle plantarflexion    Ankle inversion    Ankle eversion    (Blank rows = not tested)   TRANSFERS: Sit to stand: Complete Independence  Assistive device utilized: None     Stand to sit: Complete Independence  Assistive device utilized: None     Chair to chair: Complete Independence  Assistive device utilized: None       RAMP:  Not tested  CURB:  Not tested  STAIRS: Findings: Level of Assistance: Modified independence, Stair Negotiation Technique: Alternating Pattern  with Bilateral Rails, Number of Stairs: 4, Height of Stairs: 6 in   , and Comments: Is afraid of doing a flight of steps, feels as though she would fall.  GAIT: Findings: Distance walked: 50 ft and Comments: NBOS, unsteady at times   FUNCTIONAL TESTS:  5 times sit to stand: 15.69 sec  10 meter walk test: 12.99 sec = .76 m/s : 265 ft, mild imbalance at times, no AD  Dynamic Gait Index: Test visit 2      PATIENT  SURVEYS:  ABC scale: The Activities-Specific Balance Confidence (ABC) Scale 0% 10 20 30  40 50 60 70 80 90 100% No confidence<->completely confident  "How confident are you that you will not lose your balance or become unsteady when you . . .   Date tested 06/29/24  Walk around the house 100%  2.  Walk up or down stairs 100%  3. Bend over and pick up a slipper from in front of a closet floor 80%  4. Reach for a small can off a shelf at eye level 100%  5. Stand on tip toes and reach for something above your head 90%  6. Stand on a chair and reach for something 0%  7. Sweep the floor 100%  8. Walk outside the house to a car parked in the driveway 100%  9. Get into or out of a car 100%  10. Walk across a parking lot to the mall 70%  11. Walk up or down a ramp 80%  12. Walk in a crowded mall where people rapidly walk past you 100%  13. Are bumped into by people as you walk through the mall 80%  14. Step onto or off of an escalator while you are holding onto the railing 100%  15. Step onto or off an escalator while holding onto parcels such that you cannot hold onto the railing 100%  16. Walk outside on icy sidewalks 0%  Total: #/16 81.25%                                                                                                                                 TREATMENT DATE: 08/26/24   HR  - 63 at start of session  TA- To improve functional movements patterns for everyday tasks   Nustep x 6 min rolling hill L 3-6 for UE and LE reciprocal movement training and for LE endurance  - Post nustep 68 HR  5XSTS with 3kg ball 2 x 10  - Post HR 84   Bwd step with heel onto 1/2 foam roller, 2 x 10 bilaterally   NMR: To facilitate reeducation of movement, balance, posture, coordination, and/or proprioception/kinesthetic sense.   Incline balance: 3 x 30 seconds (height 20cm)  - Performed in // bars  SLS with foot support on ball 3 x 30 sec   Fwd/ Bwd tip toe walking x 2 in // bars - HR 89 SPO2 98  Unless otherwise stated, at least CGA was provided and gait belt donned in order to ensure pt safety.   Pt required occasional rest breaks due fatigue, PT was attentive to when pt appeared to be tired or winded in order to prevent excessive fatigue. Pt instructed on pursed lip  breathing to help with SOB.    PATIENT EDUCATION: Education details: POC Person educated: Patient Education method: Explanation Education comprehension: verbalized understanding   HOME EXERCISE PROGRAM: Access Code: [email or web address] URL: [email or web address] Date: 07/06/2024 Prepared by:  Carly Pippin  Exercises - Sit to Stand with Hands on Knees  - 1 x daily - 7 x weekly - 2 sets - 5 reps - Standing March with Counter Support  - 1 x daily - 7 x weekly - 2 sets - 10 reps - Narrow Stance with Counter Support - Vertical Head Nods  - 1 x daily - 7 x weekly - 2 sets - 10 reps   GOALS: Goals reviewed with patient? No  SHORT TERM GOALS: Target date: 07/27/2024  Patient will be independent in home exercise program to improve strength/mobility for better functional independence with ADLs. Baseline: No HEP currently  Goal status: INITIAL  LONG TERM GOALS: Target date: 09/21/2024   1.  Patient will complete five times sit to stand test in < 13 seconds indicating an increased LE strength and improved balance. Baseline: 15.69 sec 10/28:11.2 sec  Goal status: INITIAL  2.  Patient will improve ABC scale score to 90%   to demonstrate statistically significant improvement in mobility and quality of life as it relates to their balance confidence.  Baseline: 81.25% Goal status: INITIAL   3.  Patient will increase Berg Balance score by > 6 points to demonstrate decreased fall risk during functional activities. Baseline: 46 Goal status: INITIAL   4.   Patient will improve DGI score by 4 points or greater to reduce fall risk and demonstrate improved dynamic balance. Baseline: 07/06/24: 13/24 Goal status: INITIAL  5.   Patient will increase 10 meter walk test to >1.83m/s as to improve gait speed for better community ambulation and to reduce fall risk. Baseline: .76 m/s Goal status: INITIAL  6.   Patient will increase 2 minute walk test distance to >400 ft for progression to  community ambulator and improve gait ability Baseline: 265 ft Goal status: INITIAL   ASSESSMENT:  CLINICAL IMPRESSION:   Pt presents with good motivation to complete exercises today. Pt continues to be limited by dizziness, but states she has restarted a medication that has helped her feel more alert. Pts HR and SPO2 were monitored throughout with no concerning findings. Pt experienced a wave of dizziness 35 min into the session that required a sitting break. Dizziness passed quickly and she was able to resume balance work shortly after. Rest breaks were given frequently and she was encouraged to continue purse lip breathing, which she states she has been doing at home. Pt showed improvement in backward walking with decreased posterior LOB as compared to previous session. Pt will continue to benefit from skilled physical therapy intervention to address impairments, improve QOL, and attain therapy goals.    OBJECTIVE IMPAIRMENTS: Abnormal gait, decreased activity tolerance, decreased balance, decreased endurance, difficulty walking, and decreased strength.   ACTIVITY LIMITATIONS: standing, stairs, and locomotion level  PARTICIPATION LIMITATIONS: laundry, shopping, community activity, and yard work  PERSONAL FACTORS: Age and 1-2 comorbidities: HLD, HTN are also affecting patient's functional outcome.   REHAB POTENTIAL: Good  CLINICAL DECISION MAKING: Evolving/moderate complexity  EVALUATION COMPLEXITY: Moderate  PLAN:  PT FREQUENCY: 2x/week  PT DURATION: 12 weeks  PLANNED INTERVENTIONS: 97750- Physical Performance Testing, 97110-Therapeutic exercises, 97530- Therapeutic activity, 97112- Neuromuscular re-education, 97535- Self Care, [id number]- Manual therapy, [id number]- Gait training, Balance training, Stair training, DME instructions, and Moist heat  PLAN FOR NEXT SESSION:  *monitor HR during session* Dynamic gait including: Turning Horizontal head turns Change in gait speed Retro  transition Dynamic balance and B LE strengthening including: Step ups Sidestep with resistance  Vestibular screen for  dizziness     Note: Portions of this document were prepared using Dragon voice recognition software and although reviewed may contain unintentional dictation errors in syntax, grammar, or spelling.  Leonor Rode, SPT   This entire session was performed under direct supervision and direction of a licensed estate agent . I have personally read, edited and approve of the note as written.    This licensed clinician was present and actively directing care throughout the session at all times.  Lonni KATHEE Gainer PT ,DPT Physical Therapist- [place]  [facility]    2:30 PM 08/26/24

## 2024-08-31 ENCOUNTER — Ambulatory Visit: Admitting: Physical Therapy

## 2024-08-31 DIAGNOSIS — R262 Difficulty in walking, not elsewhere classified: Secondary | ICD-10-CM

## 2024-08-31 DIAGNOSIS — R269 Unspecified abnormalities of gait and mobility: Secondary | ICD-10-CM

## 2024-08-31 DIAGNOSIS — R2681 Unsteadiness on feet: Secondary | ICD-10-CM

## 2024-08-31 DIAGNOSIS — M6281 Muscle weakness (generalized): Secondary | ICD-10-CM

## 2024-08-31 NOTE — Therapy (Signed)
 OUTPATIENT PHYSICAL THERAPY NEURO TREATMENT    Patient Name: Brenda Wiley MRN: [id number] DOB:[birth year], 83 y.o., female Today's Date: 08/31/2024   PCP:   Auston Reyes BIRCH, MD   REFERRING PROVIDER:   Auston Reyes BIRCH, MD    END OF SESSION:   PT End of Session - 08/31/24 1432     Visit Number 14    Number of Visits 24    Date for Recertification  09/21/24    Progress Note Due on Visit 10    PT Start Time 1435    PT Stop Time 1515    PT Time Calculation (min) 40 min    Equipment Utilized During Treatment Gait belt    Activity Tolerance Patient tolerated treatment well    Behavior During Therapy [facility] for tasks assessed/performed          Past Medical History:  Diagnosis Date   Anal sphincter tear (healed) (old)-anterior 04/13/2014   Basal cell carcinoma    Chronic headaches    resolved   Depression    loss of spouse   Diverticulosis    Dizzy spells 02/23/2015   X 1year   HLD (hyperlipidemia)    HTN (hypertension) 09/27/2023   Past Surgical History:  Procedure Laterality Date   AUGMENTATION MAMMAPLASTY Bilateral    silicone implants put in over 30 yeras ago   CESAREAN SECTION     x 2   COLONOSCOPY     PARTIAL HYSTERECTOMY     TONSILLECTOMY     Patient Active Problem List   Diagnosis Date Noted   Demand ischemia (HCC) 09/28/2023   Anxiety disorder 09/28/2023   E coli bacteremia 09/28/2023   Sepsis secondary to UTI (HCC) 09/27/2023   UTI (urinary tract infection) 09/27/2023   NSTEMI (non-ST elevated myocardial infarction) (HCC) 09/27/2023   Acute kidney injury superimposed on chronic kidney disease 09/27/2023   HTN (hypertension) 09/27/2023   Acute metabolic encephalopathy 09/27/2023   Hyponatremia 09/27/2023   Shortness of breath 02/17/2015   Smoker 02/17/2015   Hyperlipidemia 02/17/2015   Osteoarthritis of both hips 02/17/2015   Fecal incontinence due to anorectal disorder 04/13/2014   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 04/13/2014   ONSET DATE: 3  months approx  REFERRING DIAG: R26.89 (ICD-10-CM) - Balance disorder   THERAPY DIAG:  Difficulty in walking, not elsewhere classified  Abnormality of gait and mobility  Unsteadiness on feet  Muscle weakness (generalized)  Rationale for Evaluation and Treatment: Rehabilitation  SUBJECTIVE:  SUBJECTIVE STATEMENT:  Pt states she is doing well except for an ingrown toenail that she will be going to the [place] clinic for tomorrow morning.    Pt accompanied by: self  PERTINENT HISTORY: HTN, HLD   From eval: Pt reports with her balance it is hard to explain. Pt has a lot of stumbles when she is turning around and is not very sure footed. Pt reports this has been going on for several months. Pt has not had any falls in the last 6 months but several stumbles. Pt reports it started with a UTI and it got very bad during this. Since then there has been a noticeable difference in her balance, speech and her over all and has had some difficulty with word finding. Pt has been doing a lot less and feels she needs to do more things.  Patient also reports she has a fear of bending down to pick things up at times although she is able to do it in the clinic she reports the other day she was too afraid to bend down to pick something up she needed off the ground and had to ask for assistance.  Edition patient feels she is unable to go up and down a full flight of steps and has absolute certainty of falling with here in her opinion.  PAIN:  Are you having pain? No  PRECAUTIONS: Fall  RED FLAGS: None   WEIGHT BEARING RESTRICTIONS: No  FALLS: Has patient fallen in last 6 months? No and several stumbles   LIVING ENVIRONMENT: Lives with: lives with their family and and has a dog  Lives in: House/apartment Stairs:  Yes: External: 2 steps; can reach both Has following equipment at home: None  PLOF: Independent and Independent with basic ADLs  PATIENT GOALS: Improve her overall mobility and safety with balance   OBJECTIVE:  Note: Objective measures were completed at Evaluation unless otherwise noted.  DIAGNOSTIC FINDINGS: n/a  COGNITION: Overall cognitive status: pt reports some memory and word finding impairment since UTI   SENSATION: Not tested    LOWER EXTREMITY ROM:     WFL for tasks assessed   LOWER EXTREMITY MMT:    MMT Right Eval Left Eval  Hip flexion 4+ 4+  Hip extension    Hip abduction 4+ 4+  Hip adduction 4+ 4+  Hip internal rotation    Hip external rotation    Knee flexion 4+ 4+  Knee extension 4+ 4+  Ankle dorsiflexion 4+ 4+  Ankle plantarflexion    Ankle inversion    Ankle eversion    (Blank rows = not tested)   TRANSFERS: Sit to stand: Complete Independence  Assistive device utilized: None     Stand to sit: Complete Independence  Assistive device utilized: None     Chair to chair: Complete Independence  Assistive device utilized: None       RAMP:  Not tested  CURB:  Not tested  STAIRS: Findings: Level of Assistance: Modified independence, Stair Negotiation Technique: Alternating Pattern  with Bilateral Rails, Number of Stairs: 4, Height of Stairs: 6 in   , and Comments: Is afraid of doing a flight of steps, feels as though she would fall.  GAIT: Findings: Distance walked: 50 ft and Comments: NBOS, unsteady at times   FUNCTIONAL TESTS:  5 times sit to stand: 15.69 sec  10 meter walk test: 12.99 sec = .76 m/s : 265 ft, mild imbalance at times, no AD  Dynamic Gait Index: Test visit 2  PATIENT SURVEYS:  ABC scale: The Activities-Specific Balance Confidence (ABC) Scale 0% 10 20 30  40 50 60 70 80 90 100% No confidence<->completely confident  "How confident are you that you will not lose your balance or become unsteady when you . . .    Date tested 06/29/24  Walk around the house 100%  2. Walk up or down stairs 100%  3. Bend over and pick up a slipper from in front of a closet floor 80%  4. Reach for a small can off a shelf at eye level 100%  5. Stand on tip toes and reach for something above your head 90%  6. Stand on a chair and reach for something 0%  7. Sweep the floor 100%  8. Walk outside the house to a car parked in the driveway 100%  9. Get into or out of a car 100%  10. Walk across a parking lot to the mall 70%  11. Walk up or down a ramp 80%  12. Walk in a crowded mall where people rapidly walk past you 100%  13. Are bumped into by people as you walk through the mall 80%  14. Step onto or off of an escalator while you are holding onto the railing 100%  15. Step onto or off an escalator while holding onto parcels such that you cannot hold onto the railing 100%  16. Walk outside on icy sidewalks 0%  Total: #/16 81.25%                                                                                                                                 TREATMENT DATE: 08/31/24   TA- To improve functional movements patterns for everyday tasks   Nustep x 6 min rolling hill L 3-6 for UE and LE reciprocal movement training and for LE endurance  - Post nustep 68 HR  Fwd Step ups 2 x 10 bilateral    - bilateral UE support for first set, second single UE support  -Standing break provided between sets, including purse lip breathing  TE- To improve strength, endurance, mobility, and function of specific targeted muscle groups or improve joint range of motion or improve muscle flexibility   Resisted side stepping in // bars 2 x 5 lap GTB   -HR 70  Seated alternating lateral step over: 2x20 2.5# AW  -Toe pain increased at this point, modified to seated lateral steps  LAQ: 2x10 bilaterally, 2.5# AW  Unless otherwise stated, at least CGA was provided and gait belt donned in order to ensure pt safety.   Pt required  occasional rest breaks due fatigue, PT was attentive to when pt appeared to be tired or winded in order to prevent excessive fatigue. Pt instructed on pursed lip breathing to help with SOB.    PATIENT EDUCATION: Education details: POC Person educated: Patient Education method: Explanation Education comprehension: verbalized understanding   HOME EXERCISE PROGRAM: Access Code:  [email or web address] URL: [email or web address] Date: 07/06/2024 Prepared by: Connell Kiss  Exercises - Sit to Stand with Hands on Knees  - 1 x daily - 7 x weekly - 2 sets - 5 reps - Standing March with Counter Support  - 1 x daily - 7 x weekly - 2 sets - 10 reps - Narrow Stance with Counter Support - Vertical Head Nods  - 1 x daily - 7 x weekly - 2 sets - 10 reps   GOALS: Goals reviewed with patient? No  SHORT TERM GOALS: Target date: 07/27/2024  Patient will be independent in home exercise program to improve strength/mobility for better functional independence with ADLs. Baseline: No HEP currently  Goal status: INITIAL  LONG TERM GOALS: Target date: 09/21/2024   1.  Patient will complete five times sit to stand test in < 13 seconds indicating an increased LE strength and improved balance. Baseline: 15.69 sec 10/28:11.2 sec  Goal status: INITIAL  2.  Patient will improve ABC scale score to 90%   to demonstrate statistically significant improvement in mobility and quality of life as it relates to their balance confidence.  Baseline: 81.25% Goal status: INITIAL   3.  Patient will increase Berg Balance score by > 6 points to demonstrate decreased fall risk during functional activities. Baseline: 46 Goal status: INITIAL   4.   Patient will improve DGI score by 4 points or greater to reduce fall risk and demonstrate improved dynamic balance. Baseline: 07/06/24: 13/24 Goal status: INITIAL  5.   Patient will increase 10 meter walk test to >1.39m/s as to improve gait speed for better community  ambulation and to reduce fall risk. Baseline: .76 m/s Goal status: INITIAL  6.   Patient will increase 2 minute walk test distance to >400 ft for progression to community ambulator and improve gait ability Baseline: 265 ft Goal status: INITIAL   ASSESSMENT:  CLINICAL IMPRESSION:   Pt presents with good motivation to complete exercises today. Pt did not report any dizziness, but was limited due to ingrown toenail causing pain. Pt completed resisted side stepping with moderate muscular fatigue that eased with a sitting break. Pt completed step ups progressing to single UE support. Pt able to take a standing break between step up sets, working on purse lip breathing before resuming. She required occasional verbal cues to maintain upright posture but mirror assisted in providing a visual cue. The rest of the session consisted of seated TE in order to alleviate pressure on toe. Pt will continue to benefit from skilled physical therapy intervention to address impairments, improve QOL, and attain therapy goals.    OBJECTIVE IMPAIRMENTS: Abnormal gait, decreased activity tolerance, decreased balance, decreased endurance, difficulty walking, and decreased strength.   ACTIVITY LIMITATIONS: standing, stairs, and locomotion level  PARTICIPATION LIMITATIONS: laundry, shopping, community activity, and yard work  PERSONAL FACTORS: Age and 1-2 comorbidities: HLD, HTN are also affecting patient's functional outcome.   REHAB POTENTIAL: Good  CLINICAL DECISION MAKING: Evolving/moderate complexity  EVALUATION COMPLEXITY: Moderate  PLAN:  PT FREQUENCY: 2x/week  PT DURATION: 12 weeks  PLANNED INTERVENTIONS: 97750- Physical Performance Testing, 97110-Therapeutic exercises, 97530- Therapeutic activity, 97112- Neuromuscular re-education, 97535- Self Care, [id number]- Manual therapy, 97116- Gait training, Balance training, Stair training, DME instructions, and Moist heat  PLAN FOR NEXT SESSION:   *monitor  HR during session* Dynamic gait including: Turning Horizontal head turns Change in gait speed Retro transition Dynamic balance and B LE strengthening including: Step ups (fwd/ lateral) Vestibular screen for dizziness  Note: Portions of this document were prepared using Dragon voice recognition software and although reviewed may contain unintentional dictation errors in syntax, grammar, or spelling.  Leonor Rode, SPT   This entire session was performed under direct supervision and direction of a licensed estate agent . I have personally read, edited and approve of the note as written.    This licensed clinician was present and actively directing care throughout the session at all times.  Lonni KATHEE Gainer PT ,DPT Physical Therapist- [place]  [facility], [facility]    2:33 PM 08/31/24

## 2024-09-01 DIAGNOSIS — M79675 Pain in left toe(s): Secondary | ICD-10-CM | POA: Diagnosis not present

## 2024-09-01 DIAGNOSIS — B351 Tinea unguium: Secondary | ICD-10-CM | POA: Diagnosis not present

## 2024-09-01 DIAGNOSIS — L03032 Cellulitis of left toe: Secondary | ICD-10-CM | POA: Diagnosis not present

## 2024-09-01 DIAGNOSIS — M79674 Pain in right toe(s): Secondary | ICD-10-CM | POA: Diagnosis not present

## 2024-09-02 ENCOUNTER — Ambulatory Visit: Admitting: Physical Therapy

## 2024-09-02 DIAGNOSIS — R2681 Unsteadiness on feet: Secondary | ICD-10-CM

## 2024-09-02 DIAGNOSIS — R262 Difficulty in walking, not elsewhere classified: Secondary | ICD-10-CM

## 2024-09-02 DIAGNOSIS — M6281 Muscle weakness (generalized): Secondary | ICD-10-CM

## 2024-09-02 DIAGNOSIS — R269 Unspecified abnormalities of gait and mobility: Secondary | ICD-10-CM | POA: Diagnosis not present

## 2024-09-02 NOTE — Therapy (Signed)
 OUTPATIENT PHYSICAL THERAPY NEURO TREATMENT    Patient Name: Brenda Wiley MRN: [id number] DOB:05-17-1941, 83 y.o., female Today's Date: 09/02/2024   PCP:   Auston Reyes BIRCH, MD   REFERRING PROVIDER:   Auston Reyes BIRCH, MD    END OF SESSION:   PT End of Session - 09/02/24 1305     Visit Number 15    Number of Visits 24    Date for Recertification  09/21/24    Progress Note Due on Visit 10    PT Start Time 1315    PT Stop Time 1355    PT Time Calculation (min) 40 min    Equipment Utilized During Treatment Gait belt    Activity Tolerance Patient tolerated treatment well    Behavior During Therapy [facility] for tasks assessed/performed           Past Medical History:  Diagnosis Date   Anal sphincter tear (healed) (old)-anterior 04/13/2014   Basal cell carcinoma    Chronic headaches    resolved   Depression    loss of spouse   Diverticulosis    Dizzy spells 02/23/2015   X 1year   HLD (hyperlipidemia)    HTN (hypertension) 09/27/2023   Past Surgical History:  Procedure Laterality Date   AUGMENTATION MAMMAPLASTY Bilateral    silicone implants put in over 30 yeras ago   CESAREAN SECTION     x 2   COLONOSCOPY     PARTIAL HYSTERECTOMY     TONSILLECTOMY     Patient Active Problem List   Diagnosis Date Noted   Demand ischemia (HCC) 09/28/2023   Anxiety disorder 09/28/2023   E coli bacteremia 09/28/2023   Sepsis secondary to UTI (HCC) 09/27/2023   UTI (urinary tract infection) 09/27/2023   NSTEMI (non-ST elevated myocardial infarction) (HCC) 09/27/2023   Acute kidney injury superimposed on chronic kidney disease 09/27/2023   HTN (hypertension) 09/27/2023   Acute metabolic encephalopathy 09/27/2023   Hyponatremia 09/27/2023   Shortness of breath 02/17/2015   Smoker 02/17/2015   Hyperlipidemia 02/17/2015   Osteoarthritis of both hips 02/17/2015   Fecal incontinence due to anorectal disorder 04/13/2014   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 04/13/2014   ONSET DATE: 3  months approx  REFERRING DIAG: R26.89 (ICD-10-CM) - Balance disorder   THERAPY DIAG:  Difficulty in walking, not elsewhere classified  Abnormality of gait and mobility  Unsteadiness on feet  Muscle weakness (generalized)  Rationale for Evaluation and Treatment: Rehabilitation  SUBJECTIVE:  SUBJECTIVE STATEMENT:  Pt went to [facility] clinic yesterday to check out foot. Her shoe is rubbing so she asks to limit walking for today's session.   Pt accompanied by: self  PERTINENT HISTORY: HTN, HLD   From eval: Pt reports with her balance it is hard to explain. Pt has a lot of stumbles when she is turning around and is not very sure footed. Pt reports this has been going on for several months. Pt has not had any falls in the last 6 months but several stumbles. Pt reports it started with a UTI and it got very bad during this. Since then there has been a noticeable difference in her balance, speech and her over all and has had some difficulty with word finding. Pt has been doing a lot less and feels she needs to do more things.  Patient also reports she has a fear of bending down to pick things up at times although she is able to do it in the clinic she reports the other day she was too afraid to bend down to pick something up she needed off the ground and had to ask for assistance.  Edition patient feels she is unable to go up and down a full flight of steps and has absolute certainty of falling with here in her opinion.  PAIN:  Are you having pain? No  PRECAUTIONS: Fall  RED FLAGS: None   WEIGHT BEARING RESTRICTIONS: No  FALLS: Has patient fallen in last 6 months? No and several stumbles   LIVING ENVIRONMENT: Lives with: lives with their family and and has a dog  Lives in: House/apartment Stairs:  Yes: External: 2 steps; can reach both Has following equipment at home: None  PLOF: Independent and Independent with basic ADLs  PATIENT GOALS: Improve her overall mobility and safety with balance   OBJECTIVE:  Note: Objective measures were completed at Evaluation unless otherwise noted.  DIAGNOSTIC FINDINGS: n/a  COGNITION: Overall cognitive status: pt reports some memory and word finding impairment since UTI   SENSATION: Not tested    LOWER EXTREMITY ROM:     WFL for tasks assessed   LOWER EXTREMITY MMT:    MMT Right Eval Left Eval  Hip flexion 4+ 4+  Hip extension    Hip abduction 4+ 4+  Hip adduction 4+ 4+  Hip internal rotation    Hip external rotation    Knee flexion 4+ 4+  Knee extension 4+ 4+  Ankle dorsiflexion 4+ 4+  Ankle plantarflexion    Ankle inversion    Ankle eversion    (Blank rows = not tested)   TRANSFERS: Sit to stand: Complete Independence  Assistive device utilized: None     Stand to sit: Complete Independence  Assistive device utilized: None     Chair to chair: Complete Independence  Assistive device utilized: None       RAMP:  Not tested  CURB:  Not tested  STAIRS: Findings: Level of Assistance: Modified independence, Stair Negotiation Technique: Alternating Pattern  with Bilateral Rails, Number of Stairs: 4, Height of Stairs: 6 in   , and Comments: Is afraid of doing a flight of steps, feels as though she would fall.  GAIT: Findings: Distance walked: 50 ft and Comments: NBOS, unsteady at times   FUNCTIONAL TESTS:  5 times sit to stand: 15.69 sec  10 meter walk test: 12.99 sec = .76 m/s : 265 ft, mild imbalance at times, no AD  Dynamic Gait Index: Test visit 2  PATIENT SURVEYS:  ABC scale: The Activities-Specific Balance Confidence (ABC) Scale 0% 10 20 30  40 50 60 70 80 90 100% No confidence<->completely confident  "How confident are you that you will not lose your balance or become unsteady when you . . .    Date tested 06/29/24  Walk around the house 100%  2. Walk up or down stairs 100%  3. Bend over and pick up a slipper from in front of a closet floor 80%  4. Reach for a small can off a shelf at eye level 100%  5. Stand on tip toes and reach for something above your head 90%  6. Stand on a chair and reach for something 0%  7. Sweep the floor 100%  8. Walk outside the house to a car parked in the driveway 100%  9. Get into or out of a car 100%  10. Walk across a parking lot to the mall 70%  11. Walk up or down a ramp 80%  12. Walk in a crowded mall where people rapidly walk past you 100%  13. Are bumped into by people as you walk through the mall 80%  14. Step onto or off of an escalator while you are holding onto the railing 100%  15. Step onto or off an escalator while holding onto parcels such that you cannot hold onto the railing 100%  16. Walk outside on icy sidewalks 0%  Total: #/16 81.25%                                                                                                                                 TREATMENT DATE: 09/02/24   TA- To improve functional movements patterns for everyday tasks   HR at start of session: 78  Nustep x 6 min rolling hill L 3-6 for UE and LE reciprocal movement training and for LE endurance  - Post nustep HR- 82  Standing step taps 3 x 10 without UE support   -Tendency to weight shift posteriorly resulting in LOB   -mild SOB following alleviated with rest  TE- To improve strength, endurance, mobility, and function of specific targeted muscle groups or improve joint range of motion or improve muscle flexibility   Resisted side stepping along bar 2 x 6 lap GTB    Seated alternating lateral step over: 2x20 2.5# AW   LAQ: 2x10 bilaterally, 2.5# AW  Unless otherwise stated, at least CGA was provided and gait belt donned in order to ensure pt safety.   NMR: To facilitate reeducation of movement, balance, posture, coordination, and/or  proprioception/kinesthetic sense.   SLS with foot supported by ball 3 x 30 sec ea LE  Tandem on airex 2 x 30 sec each leg  Pt required occasional rest breaks due fatigue, PT was attentive to when pt appeared to be tired or winded in order to prevent excessive fatigue. Pt instructed on pursed lip breathing to help with  SOB.    PATIENT EDUCATION: Education details: POC Person educated: Patient Education method: Explanation Education comprehension: verbalized understanding   HOME EXERCISE PROGRAM: Access Code: [email or web address] URL: [email or web address] Date: 07/06/2024 Prepared by: Connell Kiss  Exercises - Sit to Stand with Hands on Knees  - 1 x daily - 7 x weekly - 2 sets - 5 reps - Standing March with Counter Support  - 1 x daily - 7 x weekly - 2 sets - 10 reps - Narrow Stance with Counter Support - Vertical Head Nods  - 1 x daily - 7 x weekly - 2 sets - 10 reps   GOALS: Goals reviewed with patient? No  SHORT TERM GOALS: Target date: 07/27/2024  Patient will be independent in home exercise program to improve strength/mobility for better functional independence with ADLs. Baseline: No HEP currently  Goal status: INITIAL  LONG TERM GOALS: Target date: 09/21/2024   1.  Patient will complete five times sit to stand test in < 13 seconds indicating an increased LE strength and improved balance. Baseline: 15.69 sec 10/28:11.2 sec  Goal status: INITIAL  2.  Patient will improve ABC scale score to 90%   to demonstrate statistically significant improvement in mobility and quality of life as it relates to their balance confidence.  Baseline: 81.25% Goal status: INITIAL   3.  Patient will increase Berg Balance score by > 6 points to demonstrate decreased fall risk during functional activities. Baseline: 46 Goal status: INITIAL   4.   Patient will improve DGI score by 4 points or greater to reduce fall risk and demonstrate improved dynamic balance. Baseline: 07/06/24:  13/24 Goal status: INITIAL  5.   Patient will increase 10 meter walk test to >1.68m/s as to improve gait speed for better community ambulation and to reduce fall risk. Baseline: .76 m/s Goal status: INITIAL  6.   Patient will increase 2 minute walk test distance to >400 ft for progression to community ambulator and improve gait ability Baseline: 265 ft Goal status: INITIAL   ASSESSMENT:  CLINICAL IMPRESSION:   Pt presents with good motivation to complete exercises today. Pt was limited by discomfort this session after visiting [facility] clinic for her ingrown toe nail. Modified today's session to include static balance and seated exercises for off loading foot. Pt demonstrated mild sway with SLS and tandem balance with occasional UE support. Pt was most challenged with fwd step taps experiencing LOB posteriorly. Anterior weight shift has been a focus of her past sessions, and will continue to be a focus for future sessions. Pt will continue to benefit from skilled physical therapy intervention to address impairments, improve QOL, and attain therapy goals.    OBJECTIVE IMPAIRMENTS: Abnormal gait, decreased activity tolerance, decreased balance, decreased endurance, difficulty walking, and decreased strength.   ACTIVITY LIMITATIONS: standing, stairs, and locomotion level  PARTICIPATION LIMITATIONS: laundry, shopping, community activity, and yard work  PERSONAL FACTORS: Age and 1-2 comorbidities: HLD, HTN are also affecting patient's functional outcome.   REHAB POTENTIAL: Good  CLINICAL DECISION MAKING: Evolving/moderate complexity  EVALUATION COMPLEXITY: Moderate  PLAN:  PT FREQUENCY: 2x/week  PT DURATION: 12 weeks  PLANNED INTERVENTIONS: 97750- Physical Performance Testing, 97110-Therapeutic exercises, 97530- Therapeutic activity, 97112- Neuromuscular re-education, 97535- Self Care, [id number]- Manual therapy, 97116- Gait training, Balance training, Stair training, DME instructions,  and Moist heat  PLAN FOR NEXT SESSION:   *monitor HR during session* Resume dynamic gait Dynamic gait including: Turning Horizontal head turns Change in gait speed Retro transition Step taps  Balance w/ rotational component    Note: Portions of this document were prepared using Dragon voice recognition software and although reviewed may contain unintentional dictation errors in syntax, grammar, or spelling.  [place], [place]   1:06 PM 09/02/24

## 2024-09-07 ENCOUNTER — Ambulatory Visit: Admitting: Physical Therapy

## 2024-09-07 DIAGNOSIS — R262 Difficulty in walking, not elsewhere classified: Secondary | ICD-10-CM

## 2024-09-07 DIAGNOSIS — M6281 Muscle weakness (generalized): Secondary | ICD-10-CM

## 2024-09-07 DIAGNOSIS — R269 Unspecified abnormalities of gait and mobility: Secondary | ICD-10-CM

## 2024-09-07 DIAGNOSIS — R2681 Unsteadiness on feet: Secondary | ICD-10-CM

## 2024-09-07 NOTE — Therapy (Signed)
 OUTPATIENT PHYSICAL THERAPY NEURO TREATMENT    Patient Name: Brenda Wiley MRN: [id number] DOB:July 15, 1941, 83 y.o., female Today's Date: 09/07/2024   PCP:   Auston Reyes BIRCH, MD   REFERRING PROVIDER:   Auston Reyes BIRCH, MD    END OF SESSION:   PT End of Session - 09/07/24 1538     Visit Number 16    Number of Visits 24    Date for Recertification  09/21/24    Progress Note Due on Visit 10    PT Start Time 1445    PT Stop Time 1525    PT Time Calculation (min) 40 min    Equipment Utilized During Treatment Gait belt    Activity Tolerance Patient tolerated treatment well    Behavior During Therapy [facility] for tasks assessed/performed           Past Medical History:  Diagnosis Date   Anal sphincter tear (healed) (old)-anterior 04/13/2014   Basal cell carcinoma    Chronic headaches    resolved   Depression    loss of spouse   Diverticulosis    Dizzy spells 02/23/2015   X 1year   HLD (hyperlipidemia)    HTN (hypertension) 09/27/2023   Past Surgical History:  Procedure Laterality Date   AUGMENTATION MAMMAPLASTY Bilateral    silicone implants put in over 30 yeras ago   CESAREAN SECTION     x 2   COLONOSCOPY     PARTIAL HYSTERECTOMY     TONSILLECTOMY     Patient Active Problem List   Diagnosis Date Noted   Demand ischemia (HCC) 09/28/2023   Anxiety disorder 09/28/2023   E coli bacteremia 09/28/2023   Sepsis secondary to UTI (HCC) 09/27/2023   UTI (urinary tract infection) 09/27/2023   NSTEMI (non-ST elevated myocardial infarction) (HCC) 09/27/2023   Acute kidney injury superimposed on chronic kidney disease 09/27/2023   HTN (hypertension) 09/27/2023   Acute metabolic encephalopathy 09/27/2023   Hyponatremia 09/27/2023   Shortness of breath 02/17/2015   Smoker 02/17/2015   Hyperlipidemia 02/17/2015   Osteoarthritis of both hips 02/17/2015   Fecal incontinence due to anorectal disorder 04/13/2014   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 04/13/2014   ONSET DATE: 3  months approx  REFERRING DIAG: R26.89 (ICD-10-CM) - Balance disorder   THERAPY DIAG:  Abnormality of gait and mobility  Unsteadiness on feet  Difficulty in walking, not elsewhere classified  Muscle weakness (generalized)  Rationale for Evaluation and Treatment: Rehabilitation  SUBJECTIVE:  SUBJECTIVE STATEMENT:  Pt is still feeling limited secondary to toe pain, states she had to try 3 different shoes to find something comfortable. She also had to cancel her 2nd appt for the week for a dental appt.   Pt accompanied by: self  PERTINENT HISTORY: HTN, HLD   From eval: Pt reports with her balance it is hard to explain. Pt has a lot of stumbles when she is turning around and is not very sure footed. Pt reports this has been going on for several months. Pt has not had any falls in the last 6 months but several stumbles. Pt reports it started with a UTI and it got very bad during this. Since then there has been a noticeable difference in her balance, speech and her over all and has had some difficulty with word finding. Pt has been doing a lot less and feels she needs to do more things.  Patient also reports she has a fear of bending down to pick things up at times although she is able to do it in the clinic she reports the other day she was too afraid to bend down to pick something up she needed off the ground and had to ask for assistance.  Edition patient feels she is unable to go up and down a full flight of steps and has absolute certainty of falling with here in her opinion.  PAIN:  Are you having pain? No  PRECAUTIONS: Fall  RED FLAGS: None   WEIGHT BEARING RESTRICTIONS: No  FALLS: Has patient fallen in last 6 months? No and several stumbles   LIVING ENVIRONMENT: Lives with: lives with their  family and and has a dog  Lives in: House/apartment Stairs: Yes: External: 2 steps; can reach both Has following equipment at home: None  PLOF: Independent and Independent with basic ADLs  PATIENT GOALS: Improve her overall mobility and safety with balance   OBJECTIVE:  Note: Objective measures were completed at Evaluation unless otherwise noted.  DIAGNOSTIC FINDINGS: n/a  COGNITION: Overall cognitive status: pt reports some memory and word finding impairment since UTI   SENSATION: Not tested    LOWER EXTREMITY ROM:     WFL for tasks assessed   LOWER EXTREMITY MMT:    MMT Right Eval Left Eval  Hip flexion 4+ 4+  Hip extension    Hip abduction 4+ 4+  Hip adduction 4+ 4+  Hip internal rotation    Hip external rotation    Knee flexion 4+ 4+  Knee extension 4+ 4+  Ankle dorsiflexion 4+ 4+  Ankle plantarflexion    Ankle inversion    Ankle eversion    (Blank rows = not tested)   TRANSFERS: Sit to stand: Complete Independence  Assistive device utilized: None     Stand to sit: Complete Independence  Assistive device utilized: None     Chair to chair: Complete Independence  Assistive device utilized: None       RAMP:  Not tested  CURB:  Not tested  STAIRS: Findings: Level of Assistance: Modified independence, Stair Negotiation Technique: Alternating Pattern  with Bilateral Rails, Number of Stairs: 4, Height of Stairs: 6 in   , and Comments: Is afraid of doing a flight of steps, feels as though she would fall.  GAIT: Findings: Distance walked: 50 ft and Comments: NBOS, unsteady at times   FUNCTIONAL TESTS:  5 times sit to stand: 15.69 sec  10 meter walk test: 12.99 sec = .76 m/s : 265 ft, mild  imbalance at times, no AD  Dynamic Gait Index: Test visit 2      PATIENT SURVEYS:  ABC scale: The Activities-Specific Balance Confidence (ABC) Scale 0% 10 20 30  40 50 60 70 80 90 100% No confidence<->completely confident  "How confident are you that you will  not lose your balance or become unsteady when you . . .   Date tested 06/29/24  Walk around the house 100%  2. Walk up or down stairs 100%  3. Bend over and pick up a slipper from in front of a closet floor 80%  4. Reach for a small can off a shelf at eye level 100%  5. Stand on tip toes and reach for something above your head 90%  6. Stand on a chair and reach for something 0%  7. Sweep the floor 100%  8. Walk outside the house to a car parked in the driveway 100%  9. Get into or out of a car 100%  10. Walk across a parking lot to the mall 70%  11. Walk up or down a ramp 80%  12. Walk in a crowded mall where people rapidly walk past you 100%  13. Are bumped into by people as you walk through the mall 80%  14. Step onto or off of an escalator while you are holding onto the railing 100%  15. Step onto or off an escalator while holding onto parcels such that you cannot hold onto the railing 100%  16. Walk outside on icy sidewalks 0%  Total: #/16 81.25%                                                                                                                                 TREATMENT DATE: 09/07/24   TA- To improve functional movements patterns for everyday tasks   HR at start of session: 80  Nustep x 6 min rolling hill L 3-7 for UE and LE reciprocal movement training and for LE endurance  - Post nustep HR-80  Fwd gait with horizontal head turns, followed by bwd gait x 2 laps in hallway -Decreased gait speed with both horizontal head turns and bwd gait -Frequent posterior LOB with bwd gait -Standing breaks required between fwd and bwd gait  Standing Heel raises at bar x20  NMR: To facilitate reeducation of movement, balance, posture, coordination, and/or proprioception/kinesthetic sense.   Blaze Pods: Activity Description: standing balance on airex with overhead reach to blaze pods 1 x feet hip distance 1 x ea semi tandem Activity Setting:  Random Number of Pods:   4 Duration (Time or Hit Count):  1:00 min   Unless otherwise stated, at least CGA was provided and gait belt donned in order to ensure pt safety.   Pt required occasional rest breaks due fatigue, PT was attentive to when pt appeared to be tired or winded in order to prevent excessive fatigue. Pt instructed on pursed lip breathing to  help with SOB.    PATIENT EDUCATION: Education details: POC Person educated: Patient Education method: Explanation Education comprehension: verbalized understanding   HOME EXERCISE PROGRAM: Access Code: [email or web address] URL: [email or web address] Date: 07/06/2024 Prepared by: Connell Kiss  Exercises - Sit to Stand with Hands on Knees  - 1 x daily - 7 x weekly - 2 sets - 5 reps - Standing March with Counter Support  - 1 x daily - 7 x weekly - 2 sets - 10 reps - Narrow Stance with Counter Support - Vertical Head Nods  - 1 x daily - 7 x weekly - 2 sets - 10 reps   GOALS: Goals reviewed with patient? No  SHORT TERM GOALS: Target date: 07/27/2024  Patient will be independent in home exercise program to improve strength/mobility for better functional independence with ADLs. Baseline: No HEP currently  Goal status: INITIAL  LONG TERM GOALS: Target date: 09/21/2024   1.  Patient will complete five times sit to stand test in < 13 seconds indicating an increased LE strength and improved balance. Baseline: 15.69 sec 10/28:11.2 sec  Goal status: INITIAL  2.  Patient will improve ABC scale score to 90%   to demonstrate statistically significant improvement in mobility and quality of life as it relates to their balance confidence.  Baseline: 81.25% Goal status: INITIAL   3.  Patient will increase Berg Balance score by > 6 points to demonstrate decreased fall risk during functional activities. Baseline: 46 Goal status: INITIAL   4.   Patient will improve DGI score by 4 points or greater to reduce fall risk and demonstrate improved dynamic  balance. Baseline: 07/06/24: 13/24 Goal status: INITIAL  5.   Patient will increase 10 meter walk test to >1.35m/s as to improve gait speed for better community ambulation and to reduce fall risk. Baseline: .76 m/s Goal status: INITIAL  6.   Patient will increase 2 minute walk test distance to >400 ft for progression to community ambulator and improve gait ability Baseline: 265 ft Goal status: INITIAL   ASSESSMENT:  CLINICAL IMPRESSION:   Pt presents with good motivation to complete exercises today. Despite discomfort in toe patient tolerated gait exercises well. For today's session focus returned to bwd gait due to tendency for posterior LOB. Pt was challenged with bwd gait and experienced frequent LOB. Pt encouraged to lead with toe when stepping backward. Pt did not experience loss of balance with fwd gait and horizontal head turns, but demonstrated decreased gait speed with the dual task movement. Standing rest breaks were provided between fwd and bwd gait, and pt encouraged to purse lip breath if SOB returned. Blaze pods were used to challenge balance with overhead reach. Patient able to maintain semi-tandem stance for 1 min with overhead reach without LOB. Pt will continue to benefit from skilled physical therapy intervention to address impairments, improve QOL, and attain therapy goals.    OBJECTIVE IMPAIRMENTS: Abnormal gait, decreased activity tolerance, decreased balance, decreased endurance, difficulty walking, and decreased strength.   ACTIVITY LIMITATIONS: standing, stairs, and locomotion level  PARTICIPATION LIMITATIONS: laundry, shopping, community activity, and yard work  PERSONAL FACTORS: Age and 1-2 comorbidities: HLD, HTN are also affecting patient's functional outcome.   REHAB POTENTIAL: Good  CLINICAL DECISION MAKING: Evolving/moderate complexity  EVALUATION COMPLEXITY: Moderate  PLAN:  PT FREQUENCY: 2x/week  PT DURATION: 12 weeks  PLANNED INTERVENTIONS:  [id number]- Physical Performance Testing, 97110-Therapeutic exercises, 97530- Therapeutic activity, [id number]- Neuromuscular re-education, 97535- Self Care, [id number]- Manual therapy, [phone]- Gait training, Balance training,  Stair training, DME instructions, and Moist heat  PLAN FOR NEXT SESSION:   *monitor HR during session* Dynamic gait including: Turning Horizontal head turns Change in gait speed Retro transition Step taps  Balance w/ rotational component  Bwd stepping   Note: Portions of this document were prepared using Dragon voice recognition software and although reviewed may contain unintentional dictation errors in syntax, grammar, or spelling.  [place], [place]   3:47 PM 09/07/24

## 2024-09-09 ENCOUNTER — Ambulatory Visit

## 2024-09-14 ENCOUNTER — Ambulatory Visit: Admitting: Physical Therapy

## 2024-09-14 DIAGNOSIS — R2681 Unsteadiness on feet: Secondary | ICD-10-CM

## 2024-09-14 DIAGNOSIS — M6281 Muscle weakness (generalized): Secondary | ICD-10-CM

## 2024-09-14 DIAGNOSIS — R269 Unspecified abnormalities of gait and mobility: Secondary | ICD-10-CM

## 2024-09-14 DIAGNOSIS — R262 Difficulty in walking, not elsewhere classified: Secondary | ICD-10-CM

## 2024-09-14 NOTE — Therapy (Addendum)
 OUTPATIENT PHYSICAL THERAPY NEURO TREATMENT    Patient Name: Brenda Wiley MRN: [id number] DOB:[birth year], 83 y.o., female Today's Date: 09/14/2024   PCP:   Auston Reyes BIRCH, MD   REFERRING PROVIDER:   Auston Reyes BIRCH, MD    END OF SESSION:   PT End of Session - 09/14/24 1428     Visit Number 17    Number of Visits 24    Date for Recertification  09/21/24    Progress Note Due on Visit 10    PT Start Time 1432    PT Stop Time 1512    PT Time Calculation (min) 40 min    Equipment Utilized During Treatment Gait belt    Activity Tolerance Patient tolerated treatment well    Behavior During Therapy [facility] for tasks assessed/performed          Past Medical History:  Diagnosis Date   Anal sphincter tear (healed) (old)-anterior 04/13/2014   Basal cell carcinoma    Chronic headaches    resolved   Depression    loss of spouse   Diverticulosis    Dizzy spells 02/23/2015   X 1year   HLD (hyperlipidemia)    HTN (hypertension) 09/27/2023   Past Surgical History:  Procedure Laterality Date   AUGMENTATION MAMMAPLASTY Bilateral    silicone implants put in over 30 yeras ago   CESAREAN SECTION     x 2   COLONOSCOPY     PARTIAL HYSTERECTOMY     TONSILLECTOMY     Patient Active Problem List   Diagnosis Date Noted   Demand ischemia (HCC) 09/28/2023   Anxiety disorder 09/28/2023   E coli bacteremia 09/28/2023   Sepsis secondary to UTI (HCC) 09/27/2023   UTI (urinary tract infection) 09/27/2023   NSTEMI (non-ST elevated myocardial infarction) (HCC) 09/27/2023   Acute kidney injury superimposed on chronic kidney disease 09/27/2023   HTN (hypertension) 09/27/2023   Acute metabolic encephalopathy 09/27/2023   Hyponatremia 09/27/2023   Shortness of breath 02/17/2015   Smoker 02/17/2015   Hyperlipidemia 02/17/2015   Osteoarthritis of both hips 02/17/2015   Fecal incontinence due to anorectal disorder 04/13/2014   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 04/13/2014   ONSET DATE: 3  months approx  REFERRING DIAG: R26.89 (ICD-10-CM) - Balance disorder   THERAPY DIAG:  Abnormality of gait and mobility  Unsteadiness on feet  Difficulty in walking, not elsewhere classified  Muscle weakness (generalized)  Rationale for Evaluation and Treatment: Rehabilitation  SUBJECTIVE:  SUBJECTIVE STATEMENT:  Pt states her toe pain is the same, but not getting worse so nothing to complain about.  Pt accompanied by: self  PERTINENT HISTORY: HTN, HLD   From eval: Pt reports with her balance it is hard to explain. Pt has a lot of stumbles when she is turning around and is not very sure footed. Pt reports this has been going on for several months. Pt has not had any falls in the last 6 months but several stumbles. Pt reports it started with a UTI and it got very bad during this. Since then there has been a noticeable difference in her balance, speech and her over all and has had some difficulty with word finding. Pt has been doing a lot less and feels she needs to do more things.  Patient also reports she has a fear of bending down to pick things up at times although she is able to do it in the clinic she reports the other day she was too afraid to bend down to pick something up she needed off the ground and had to ask for assistance.  Edition patient feels she is unable to go up and down a full flight of steps and has absolute certainty of falling with here in her opinion.  PAIN:  Are you having pain? No  PRECAUTIONS: Fall  RED FLAGS: None   WEIGHT BEARING RESTRICTIONS: No  FALLS: Has patient fallen in last 6 months? No and several stumbles   LIVING ENVIRONMENT: Lives with: lives with their family and and has a dog  Lives in: House/apartment Stairs: Yes: External: 2 steps; can reach  both Has following equipment at home: None  PLOF: Independent and Independent with basic ADLs  PATIENT GOALS: Improve her overall mobility and safety with balance   OBJECTIVE:  Note: Objective measures were completed at Evaluation unless otherwise noted.  DIAGNOSTIC FINDINGS: n/a  COGNITION: Overall cognitive status: pt reports some memory and word finding impairment since UTI   SENSATION: Not tested    LOWER EXTREMITY ROM:     WFL for tasks assessed   LOWER EXTREMITY MMT:    MMT Right Eval Left Eval  Hip flexion 4+ 4+  Hip extension    Hip abduction 4+ 4+  Hip adduction 4+ 4+  Hip internal rotation    Hip external rotation    Knee flexion 4+ 4+  Knee extension 4+ 4+  Ankle dorsiflexion 4+ 4+  Ankle plantarflexion    Ankle inversion    Ankle eversion    (Blank rows = not tested)   TRANSFERS: Sit to stand: Complete Independence  Assistive device utilized: None     Stand to sit: Complete Independence  Assistive device utilized: None     Chair to chair: Complete Independence  Assistive device utilized: None       RAMP:  Not tested  CURB:  Not tested  STAIRS: Findings: Level of Assistance: Modified independence, Stair Negotiation Technique: Alternating Pattern  with Bilateral Rails, Number of Stairs: 4, Height of Stairs: 6 in   , and Comments: Is afraid of doing a flight of steps, feels as though she would fall.  GAIT: Findings: Distance walked: 50 ft and Comments: NBOS, unsteady at times   FUNCTIONAL TESTS:  5 times sit to stand: 15.69 sec  10 meter walk test: 12.99 sec = .76 m/s : 265 ft, mild imbalance at times, no AD  Dynamic Gait Index: Test visit 2      PATIENT SURVEYS:  ABC scale: The Activities-Specific Balance Confidence (ABC) Scale 0% 10 20 30  40 50 60 70 80 90 100% No confidence<->completely confident  "How confident are you that you will not lose your balance or become unsteady when you . . .   Date tested 06/29/24  Walk around the  house 100%  2. Walk up or down stairs 100%  3. Bend over and pick up a slipper from in front of a closet floor 80%  4. Reach for a small can off a shelf at eye level 100%  5. Stand on tip toes and reach for something above your head 90%  6. Stand on a chair and reach for something 0%  7. Sweep the floor 100%  8. Walk outside the house to a car parked in the driveway 100%  9. Get into or out of a car 100%  10. Walk across a parking lot to the mall 70%  11. Walk up or down a ramp 80%  12. Walk in a crowded mall where people rapidly walk past you 100%  13. Are bumped into by people as you walk through the mall 80%  14. Step onto or off of an escalator while you are holding onto the railing 100%  15. Step onto or off an escalator while holding onto parcels such that you cannot hold onto the railing 100%  16. Walk outside on icy sidewalks 0%  Total: #/16 81.25%                                                                                                                                 TREATMENT DATE: 09/14/24   TA- To improve functional movements patterns for everyday tasks   HR at start of session: 67  Nustep x 8 min rolling hill L 3-7 for UE and LE reciprocal movement training and for LE endurance  - Post nustep HR-69  Side, fwd, and bwd stepping through line of cones down and back 2 x 2 laps   End of session gait to hallway followed by retro gait x 75 ft  -15 ft without posterior LOB, pt able to regain balance on own  NMR: To facilitate reeducation of movement, balance, posture, coordination, and/or proprioception/kinesthetic sense.   Blaze Pods: Activity Description: lateral facing on airex, standing semi-tandem reaching across body to blazepods and fwd to home base Activity Setting:  Random Number of Pods:  4 Duration (Time or Hit Count):  1:00 min x 2 rounds ea side   Toe taps to cone x 20   - Progressed to no UE support after ~5 taps  -Lateral LOB with min A from SPT  to recover  Unless otherwise stated, at least CGA was provided and gait belt donned in order to ensure pt safety.   Pt required occasional rest breaks due fatigue, PT was attentive to when pt appeared to be tired or winded in order to prevent excessive fatigue. Pt  instructed on pursed lip breathing to help with SOB.    PATIENT EDUCATION: Education details: POC Person educated: Patient Education method: Explanation Education comprehension: verbalized understanding   HOME EXERCISE PROGRAM: Access Code: [email or web address] URL: [email or web address] Date: 07/06/2024 Prepared by: Connell Kiss  Exercises - Sit to Stand with Hands on Knees  - 1 x daily - 7 x weekly - 2 sets - 5 reps - Standing March with Counter Support  - 1 x daily - 7 x weekly - 2 sets - 10 reps - Narrow Stance with Counter Support - Vertical Head Nods  - 1 x daily - 7 x weekly - 2 sets - 10 reps   GOALS: Goals reviewed with patient? No  SHORT TERM GOALS: Target date: 07/27/2024  Patient will be independent in home exercise program to improve strength/mobility for better functional independence with ADLs. Baseline: No HEP currently  Goal status: INITIAL  LONG TERM GOALS: Target date: 09/21/2024   1.  Patient will complete five times sit to stand test in < 13 seconds indicating an increased LE strength and improved balance. Baseline: 15.69 sec 10/28:11.2 sec  Goal status: INITIAL  2.  Patient will improve ABC scale score to 90%   to demonstrate statistically significant improvement in mobility and quality of life as it relates to their balance confidence.  Baseline: 81.25% Goal status: INITIAL   3.  Patient will increase Berg Balance score by > 6 points to demonstrate decreased fall risk during functional activities. Baseline: 46 Goal status: INITIAL   4.   Patient will improve DGI score by 4 points or greater to reduce fall risk and demonstrate improved dynamic balance. Baseline: 07/06/24: 13/24 Goal  status: INITIAL  5.   Patient will increase 10 meter walk test to >1.53m/s as to improve gait speed for better community ambulation and to reduce fall risk. Baseline: .76 m/s Goal status: INITIAL  6.   Patient will increase 2 minute walk test distance to >400 ft for progression to community ambulator and improve gait ability Baseline: 265 ft Goal status: INITIAL   ASSESSMENT:  CLINICAL IMPRESSION:   Pt presents with good motivation to complete exercises today. Due to challenges with retro gait, session focused on directional changes with gait and single leg balance. Pt began cone activity with decreased step length, but as progressing through activity pt was able to increase step length and clear cones with more ease. VC to limit bwd step to two step helped reduce shuffling. Step taps to cone were progressed from single UE support to no support. Lateral LOB occurred requiring min A from SPT to regain balance. Pt was most challenged this session with blaze pod activity, however pt did well to maintain balance while crossing midline. Pt required both standing and seated rest breaks to catch breath. Pt encouraged to focus on breathing while completing activities. Pt will continue to benefit from skilled physical therapy intervention to address impairments, improve QOL, and attain therapy goals.    OBJECTIVE IMPAIRMENTS: Abnormal gait, decreased activity tolerance, decreased balance, decreased endurance, difficulty walking, and decreased strength.   ACTIVITY LIMITATIONS: standing, stairs, and locomotion level  PARTICIPATION LIMITATIONS: laundry, shopping, community activity, and yard work  PERSONAL FACTORS: Age and 1-2 comorbidities: HLD, HTN are also affecting patient's functional outcome.   REHAB POTENTIAL: Good  CLINICAL DECISION MAKING: Evolving/moderate complexity  EVALUATION COMPLEXITY: Moderate  PLAN:  PT FREQUENCY: 2x/week  PT DURATION: 12 weeks  PLANNED INTERVENTIONS:  97750- Physical Performance Testing, 97110-Therapeutic exercises, 97530- Therapeutic activity,  02887- Neuromuscular re-education, [phone]- Self Care, [id number]- Manual therapy, [phone]- Gait training, Balance training, Stair training, DME instructions, and Moist heat  PLAN FOR NEXT SESSION:   *monitor HR during session* Dynamic gait including: Turning Horizontal head turns Change in gait speed Retro transition Step taps  Balance w/ rotational component  Bwd stepping   Note: Portions of this document were prepared using Dragon voice recognition software and although reviewed may contain unintentional dictation errors in syntax, grammar, or spelling.  Leonor Rode, SPT   This entire session was performed under direct supervision and direction of a licensed estate agent . I have personally read, edited and approve of the note as written.    This licensed clinician was present and actively directing care throughout the session at all times.  Lonni KATHEE Gainer PT ,DPT Physical Therapist- [facility]    2:29 PM 09/14/24

## 2024-09-20 DIAGNOSIS — G894 Chronic pain syndrome: Secondary | ICD-10-CM | POA: Diagnosis not present

## 2024-09-20 DIAGNOSIS — J431 Panlobular emphysema: Secondary | ICD-10-CM | POA: Diagnosis not present

## 2024-09-20 DIAGNOSIS — R7303 Prediabetes: Secondary | ICD-10-CM | POA: Diagnosis not present

## 2024-09-20 DIAGNOSIS — E7800 Pure hypercholesterolemia, unspecified: Secondary | ICD-10-CM | POA: Diagnosis not present

## 2024-09-20 DIAGNOSIS — Z79899 Other long term (current) drug therapy: Secondary | ICD-10-CM | POA: Diagnosis not present

## 2024-09-20 DIAGNOSIS — F339 Major depressive disorder, recurrent, unspecified: Secondary | ICD-10-CM | POA: Diagnosis not present

## 2024-09-20 DIAGNOSIS — F3341 Major depressive disorder, recurrent, in partial remission: Secondary | ICD-10-CM | POA: Diagnosis not present

## 2024-09-20 DIAGNOSIS — N1832 Chronic kidney disease, stage 3b: Secondary | ICD-10-CM | POA: Diagnosis not present

## 2024-09-21 ENCOUNTER — Ambulatory Visit: Admitting: Physical Therapy

## 2024-09-21 DIAGNOSIS — R269 Unspecified abnormalities of gait and mobility: Secondary | ICD-10-CM | POA: Insufficient documentation

## 2024-09-21 DIAGNOSIS — M6281 Muscle weakness (generalized): Secondary | ICD-10-CM | POA: Diagnosis present

## 2024-09-21 DIAGNOSIS — R262 Difficulty in walking, not elsewhere classified: Secondary | ICD-10-CM | POA: Insufficient documentation

## 2024-09-21 DIAGNOSIS — R2681 Unsteadiness on feet: Secondary | ICD-10-CM | POA: Insufficient documentation

## 2024-09-21 NOTE — Therapy (Signed)
 OUTPATIENT PHYSICAL THERAPY NEURO TREATMENT/ RECERT    Patient Name: Brenda Wiley MRN: [id number] DOB:Jun 18, 1941, 83 y.o., female Today's Date: 09/21/2024   PCP:   Auston Reyes BIRCH, MD   REFERRING PROVIDER:   Auston Reyes BIRCH, MD    Dates of reporting period  08/17/2024   to   09/21/2024   END OF SESSION:   PT End of Session - 09/21/24 1651     Visit Number 18    Number of Visits 24    Date for Recertification  10/19/24    Progress Note Due on Visit 10    PT Start Time 1445    PT Stop Time 1525    PT Time Calculation (min) 40 min    Equipment Utilized During Treatment Gait belt    Activity Tolerance Patient tolerated treatment well    Behavior During Therapy [facility] for tasks assessed/performed          Past Medical History:  Diagnosis Date   Anal sphincter tear (healed) (old)-anterior 04/13/2014   Basal cell carcinoma    Chronic headaches    resolved   Depression    loss of spouse   Diverticulosis    Dizzy spells 02/23/2015   X 1year   HLD (hyperlipidemia)    HTN (hypertension) 09/27/2023   Past Surgical History:  Procedure Laterality Date   AUGMENTATION MAMMAPLASTY Bilateral    silicone implants put in over 30 yeras ago   CESAREAN SECTION     x 2   COLONOSCOPY     PARTIAL HYSTERECTOMY     TONSILLECTOMY     Patient Active Problem List   Diagnosis Date Noted   Demand ischemia (HCC) 09/28/2023   Anxiety disorder 09/28/2023   E coli bacteremia 09/28/2023   Sepsis secondary to UTI (HCC) 09/27/2023   UTI (urinary tract infection) 09/27/2023   NSTEMI (non-ST elevated myocardial infarction) (HCC) 09/27/2023   Acute kidney injury superimposed on chronic kidney disease 09/27/2023   HTN (hypertension) 09/27/2023   Acute metabolic encephalopathy 09/27/2023   Hyponatremia 09/27/2023   Shortness of breath 02/17/2015   Smoker 02/17/2015   Hyperlipidemia 02/17/2015   Osteoarthritis of both hips 02/17/2015   Fecal incontinence due to anorectal disorder  04/13/2014   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 04/13/2014   ONSET DATE: 3 months approx  REFERRING DIAG: R26.89 (ICD-10-CM) - Balance disorder   THERAPY DIAG:  Abnormality of gait and mobility  Unsteadiness on feet  Difficulty in walking, not elsewhere classified  Muscle weakness (generalized)  Rationale for Evaluation and Treatment: Rehabilitation  SUBJECTIVE:  SUBJECTIVE STATEMENT:  Pt is doing well, states no complaints. Pt did experience a fall during Thanksgiving dinner. She explains she just slid out of the chair when trying to get up. She is not sure how it happened. Pt reports no injuries, and she was able to get up on her own.  Pt accompanied by: self  PERTINENT HISTORY: HTN, HLD   From eval: Pt reports with her balance it is hard to explain. Pt has a lot of stumbles when she is turning around and is not very sure footed. Pt reports this has been going on for several months. Pt has not had any falls in the last 6 months but several stumbles. Pt reports it started with a UTI and it got very bad during this. Since then there has been a noticeable difference in her balance, speech and her over all and has had some difficulty with word finding. Pt has been doing a lot less and feels she needs to do more things.  Patient also reports she has a fear of bending down to pick things up at times although she is able to do it in the clinic she reports the other day she was too afraid to bend down to pick something up she needed off the ground and had to ask for assistance.  Edition patient feels she is unable to go up and down a full flight of steps and has absolute certainty of falling with here in her opinion.  PAIN:  Are you having pain? No  PRECAUTIONS: Fall  RED FLAGS: None   WEIGHT BEARING  RESTRICTIONS: No  FALLS: Has patient fallen in last 6 months? No and several stumbles   LIVING ENVIRONMENT: Lives with: lives with their family and and has a dog  Lives in: House/apartment Stairs: Yes: External: 2 steps; can reach both Has following equipment at home: None  PLOF: Independent and Independent with basic ADLs  PATIENT GOALS: Improve her overall mobility and safety with balance   OBJECTIVE:  Note: Objective measures were completed at Evaluation unless otherwise noted.  DIAGNOSTIC FINDINGS: n/a  COGNITION: Overall cognitive status: pt reports some memory and word finding impairment since UTI   SENSATION: Not tested    LOWER EXTREMITY ROM:     WFL for tasks assessed   LOWER EXTREMITY MMT:    MMT Right Eval Left Eval  Hip flexion 4+ 4+  Hip extension    Hip abduction 4+ 4+  Hip adduction 4+ 4+  Hip internal rotation    Hip external rotation    Knee flexion 4+ 4+  Knee extension 4+ 4+  Ankle dorsiflexion 4+ 4+  Ankle plantarflexion    Ankle inversion    Ankle eversion    (Blank rows = not tested)   TRANSFERS: Sit to stand: Complete Independence  Assistive device utilized: None     Stand to sit: Complete Independence  Assistive device utilized: None     Chair to chair: Complete Independence  Assistive device utilized: None       RAMP:  Not tested  CURB:  Not tested  STAIRS: Findings: Level of Assistance: Modified independence, Stair Negotiation Technique: Alternating Pattern  with Bilateral Rails, Number of Stairs: 4, Height of Stairs: 6 in   , and Comments: Is afraid of doing a flight of steps, feels as though she would fall.  GAIT: Findings: Distance walked: 50 ft and Comments: NBOS, unsteady at times   FUNCTIONAL TESTS:  5 times sit to stand: 15.69 sec  10 meter walk test: 12.99 sec = .76 m/s : 265 ft, mild imbalance at times, no AD  Dynamic Gait Index: Test visit 2   [facility] PT Assessment - 09/21/24 0001       Berg Balance Test    Sit to Stand Able to stand without using hands and stabilize independently    Standing Unsupported Able to stand safely 2 minutes    Sitting with Back Unsupported but Feet Supported on Floor or Stool Able to sit safely and securely 2 minutes    Stand to Sit Sits safely with minimal use of hands    Transfers Able to transfer safely, minor use of hands    Standing Unsupported with Eyes Closed Able to stand 10 seconds safely    Standing Unsupported with Feet Together Able to place feet together independently and stand 1 minute safely    From Standing, Reach Forward with Outstretched Arm Can reach forward >12 cm safely (5)    From Standing Position, Pick up Object from Floor Able to pick up shoe safely and easily    From Standing Position, Turn to Look Behind Over each Shoulder Looks behind from both sides and weight shifts well    Turn 360 Degrees Able to turn 360 degrees safely in 4 seconds or less    Standing Unsupported, Alternately Place Feet on Step/Stool Able to stand independently and safely and complete 8 steps in 20 seconds    Standing Unsupported, One Foot in Front Able to place foot tandem independently and hold 30 seconds    Standing on One Leg Able to lift leg independently and hold > 10 seconds    Total Score 55      Dynamic Gait Index   Level Surface Mild Impairment    Change in Gait Speed Mild Impairment    Gait with Horizontal Head Turns Mild Impairment    Gait with Vertical Head Turns Mild Impairment    Gait and Pivot Turn Mild Impairment    Step Over Obstacle Normal    Step Around Obstacles Mild Impairment    Steps Mild Impairment    Total Score 17            PATIENT SURVEYS:  ABC scale: The Activities-Specific Balance Confidence (ABC) Scale 0% 10 20 30  40 50 60 70 80 90 100% No confidence<->completely confident  "How confident are you that you will not lose your balance or become unsteady when you . . .   Date tested 06/29/24  Walk around the house 100%  2.  Walk up or down stairs 100%  3. Bend over and pick up a slipper from in front of a closet floor 80%  4. Reach for a small can off a shelf at eye level 100%  5. Stand on tip toes and reach for something above your head 90%  6. Stand on a chair and reach for something 0%  7. Sweep the floor 100%  8. Walk outside the house to a car parked in the driveway 100%  9. Get into or out of a car 100%  10. Walk across a parking lot to the mall 70%  11. Walk up or down a ramp 80%  12. Walk in a crowded mall where people rapidly walk past you 100%  13. Are bumped into by people as you walk through the mall 80%  14. Step onto or off of an escalator while you are holding onto the railing 100%  15. Step onto or off  an escalator while holding onto parcels such that you cannot hold onto the railing 100%  16. Walk outside on icy sidewalks 0%  Total: #/16 81.25%                                                                                                                                 TREATMENT DATE: 09/21/24   Physical Performance Test or Measurement: a  physical performance test(s) or measurement (eg,  musculoskeletal, functional capacity), with written report,  each 15 mins    Patient demonstrates increased fall risk as noted by score of  55 /56 on Berg Balance Scale.  (<36= high risk for falls, close to 100%; 37-45 significant >80%; 46-51 moderate >50%; 52-55 lower >25%)   PT instructed pt in DGI. See below for results. Demonstrates increased fall risk with score of 17/24. (<19 indicates increased fall risk)    10 Meter Walk Test: Patient instructed to walk 10 meters (32.8 ft) as quickly and as safely as possible at their normal speed Results: 1.04 m/s (9.59 seconds)  Cut off scores:   Household Ambulator  < 0.4 m/s  Limited Community Ambulator  0.4 - 0.8 m/s  [employer]  > 0.8 m/s  Increased fall risk  < 1.32m/s  Crossing a Street  >1.31m/s  MCID 0.05 m/s (small), 0.13 m/s  (moderate), 0.06 m/s (significant)  (ANPTA Core Set of Outcome Measures for Adults with Neurologic Conditions, 2018)     2 Minute Walk Test: 405 ft   ABC: Date tested 2-Dec   1: Walk around the house 60   2. Walk up or down stairs 80   3. Bend over and pick up a slipper from in front of a closet floor 100   4. Reach for a small can off a shelf at eye level 10   5. Stand on tip toes and reach for something above your head 40   6. Stand on a chair and reach for something 0   7. Sweep the floor 90   8. Walk outside the house to a car parked in the driveway 90   9. Get into or out of a car 90   10. Walk across a parking lot to the mall 70   11. Walk up or down a ramp 50   12. Walk in a crowded mall where people rapidly walk past you 70   13. Are bumped into by people as you walk through the mall 50   14. Step onto or off of an escalator while you are holding onto the railing 70   15. Step onto or off an escalator while holding onto parcels such that you cannot hold onto the railing 50   16. Walk outside on icy sidewalks 10   Total: #/16 [phone]    Unless otherwise stated, at least CGA was provided and gait belt donned in order to ensure pt safety.  Pt required occasional rest breaks due fatigue, PT was attentive to when pt appeared to be tired or winded in order to prevent excessive fatigue. Pt instructed on pursed lip breathing to help with SOB.    PATIENT EDUCATION: Education details: POC Person educated: Patient Education method: Explanation Education comprehension: verbalized understanding   HOME EXERCISE PROGRAM: Access Code: [email or web address] URL: [email or web address] Date: 07/06/2024 Prepared by: Connell Kiss  Exercises - Sit to Stand with Hands on Knees  - 1 x daily - 7 x weekly - 2 sets - 5 reps - Standing March with Counter Support  - 1 x daily - 7 x weekly - 2 sets - 10 reps - Narrow Stance with Counter Support - Vertical Head Nods  - 1 x daily - 7 x  weekly - 2 sets - 10 reps   GOALS: Goals reviewed with patient? No  SHORT TERM GOALS: Target date: 07/27/2024  Patient will be independent in home exercise program to improve strength/mobility for better functional independence with ADLs. Baseline: HEP provided 07/06/2024 Goal status: INITIAL  LONG TERM GOALS: Target date: 10/19/2024   1.  Patient will complete five times sit to stand test in < 13 seconds indicating an increased LE strength and improved balance. Baseline: 15.69 sec 10/28:11.2 sec  Goal status: MET  2.  Patient will improve ABC scale score to 90%   to demonstrate statistically significant improvement in mobility and quality of life as it relates to their balance confidence.  Baseline: 81.25%; 12/2: 58% Goal status: ONGOING   3.  Patient will increase Berg Balance score by > 6 points to demonstrate decreased fall risk during functional activities. Baseline: 46, 10/28 47/56; 12/2: 55/56 Goal status: MET   4.   Patient will improve DGI score by 4 points or greater to reduce fall risk and demonstrate improved dynamic balance. Baseline: 07/06/24: 13/24, 08/17/24: 15/24; 12/2: 17/24 Goal status: MET  5.   Patient will increase 10 meter walk test to >1.80m/s as to improve gait speed for better community ambulation and to reduce fall risk. Baseline: .76 m/s, 10/28: .44m/s, 12/02: 1.04 m/s Goal status: MET  6.   Patient will increase 2 minute walk test distance to >400 ft for progression to community ambulator and improve gait ability Baseline: 265 ft, 12/2 405 ft Goal status: MET  7.  Patient will report 60% confidence in reaching overhead for improved functional balance and safety in retrieving items from cabinets.  Baseline: 10% confidence per ABC scale Goal status: INITIAL   8.  Patient will be able to walk backwards 94ft without posterior LOB to demonstrate decreased fall risk with dynamic gait activities. Baseline: To be assessed at next visit Goal status:  INITIAL   ASSESSMENT:  CLINICAL IMPRESSION:   Pt presents this session for recert/progress update. Pt completed , BERG, DGI, , and ABC scale. Pt has made significant progress, meeting most long term goals above. was >1.0 m/s indicating decreased risk for falls. Pt scored 55/56 on BERG and demonstrated improved balance with ability to maintain tandem stance and SLS without UE support. DGI goal was also met, but most scores indicate mild impairment. also showed great progress indicating improved endurance overall. Pt was proud of the progress she has made so far, but indicated by a recent fall, pts low confidence with overhead activities, and challenges with bwd gait new goals have been set. Patient's condition has the potential to improve in response to therapy. Maximum improvement is yet to be obtained. The  anticipated improvement is attainable and reasonable in a generally predictable time.      OBJECTIVE IMPAIRMENTS: Abnormal gait, decreased activity tolerance, decreased balance, decreased endurance, difficulty walking, and decreased strength.   ACTIVITY LIMITATIONS: standing, stairs, and locomotion level  PARTICIPATION LIMITATIONS: laundry, shopping, community activity, and yard work  PERSONAL FACTORS: Age and 1-2 comorbidities: HLD, HTN are also affecting patient's functional outcome.   REHAB POTENTIAL: Good  CLINICAL DECISION MAKING: Evolving/moderate complexity  EVALUATION COMPLEXITY: Moderate  PLAN:  PT FREQUENCY: 1-2x/week  PT DURATION: 4 weeks  PLANNED INTERVENTIONS: 97750- Physical Performance Testing, 97110-Therapeutic exercises, 97530- Therapeutic activity, 97112- Neuromuscular re-education, 97535- Self Care, [id number]- Manual therapy, [phone]- Gait training, Balance training, Stair training, DME instructions, and Moist heat  PLAN FOR NEXT SESSION:   *monitor HR during session* Dynamic gait including: Turning Horizontal head turns Change in gait  speed Retro transition Balance w/ rotational component  Bwd gait Overhead reaching activities Update HEP   Note: Portions of this document were prepared using Dragon voice recognition software and although reviewed may contain unintentional dictation errors in syntax, grammar, or spelling.   This entire session was performed under direct supervision and direction of a licensed therapist/therapist assistant . I have personally read, edited and approve of the note as written.    This licensed clinician was present and actively directing care throughout the session at all times.  [place], SPT   4:52 PM 09/21/24

## 2024-09-23 ENCOUNTER — Ambulatory Visit: Admitting: Physical Therapy

## 2024-09-28 ENCOUNTER — Ambulatory Visit: Admitting: Physical Therapy

## 2024-09-30 ENCOUNTER — Ambulatory Visit: Admitting: Physical Therapy

## 2024-10-06 ENCOUNTER — Ambulatory Visit: Admitting: Physical Therapy

## 2024-10-06 DIAGNOSIS — R269 Unspecified abnormalities of gait and mobility: Secondary | ICD-10-CM

## 2024-10-06 DIAGNOSIS — M6281 Muscle weakness (generalized): Secondary | ICD-10-CM

## 2024-10-06 DIAGNOSIS — R262 Difficulty in walking, not elsewhere classified: Secondary | ICD-10-CM

## 2024-10-06 DIAGNOSIS — R2681 Unsteadiness on feet: Secondary | ICD-10-CM

## 2024-10-06 NOTE — Therapy (Unsigned)
 OUTPATIENT PHYSICAL THERAPY NEURO TREATMENT    Patient Name: Brenda Wiley MRN: [id number] DOB:December 24, 1940, 83 y.o., female Today's Date: 10/06/2024   PCP:   Auston Reyes BIRCH, MD   REFERRING PROVIDER:   Auston Reyes BIRCH, MD    END OF SESSION:   PT End of Session - 10/06/24 1610     Visit Number 19    Number of Visits 24    Date for Recertification  10/19/24    Progress Note Due on Visit 10    PT Start Time 1615    PT Stop Time 1655    PT Time Calculation (min) 40 min    Equipment Utilized During Treatment Gait belt    Activity Tolerance Patient tolerated treatment well    Behavior During Therapy [facility] for tasks assessed/performed         Past Medical History:  Diagnosis Date   Anal sphincter tear (healed) (old)-anterior 04/13/2014   Basal cell carcinoma    Chronic headaches    resolved   Depression    loss of spouse   Diverticulosis    Dizzy spells 02/23/2015   X 1year   HLD (hyperlipidemia)    HTN (hypertension) 09/27/2023   Past Surgical History:  Procedure Laterality Date   AUGMENTATION MAMMAPLASTY Bilateral    silicone implants put in over 30 yeras ago   CESAREAN SECTION     x 2   COLONOSCOPY     PARTIAL HYSTERECTOMY     TONSILLECTOMY     Patient Active Problem List   Diagnosis Date Noted   Demand ischemia (HCC) 09/28/2023   Anxiety disorder 09/28/2023   E coli bacteremia 09/28/2023   Sepsis secondary to UTI (HCC) 09/27/2023   UTI (urinary tract infection) 09/27/2023   NSTEMI (non-ST elevated myocardial infarction) (HCC) 09/27/2023   Acute kidney injury superimposed on chronic kidney disease 09/27/2023   HTN (hypertension) 09/27/2023   Acute metabolic encephalopathy 09/27/2023   Hyponatremia 09/27/2023   Shortness of breath 02/17/2015   Smoker 02/17/2015   Hyperlipidemia 02/17/2015   Osteoarthritis of both hips 02/17/2015   Fecal incontinence due to anorectal disorder 04/13/2014   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 04/13/2014   ONSET DATE: 3 months  approx  REFERRING DIAG: R26.89 (ICD-10-CM) - Balance disorder   THERAPY DIAG:  Abnormality of gait and mobility  Unsteadiness on feet  Difficulty in walking, not elsewhere classified  Muscle weakness (generalized)  Rationale for Evaluation and Treatment: Rehabilitation  SUBJECTIVE:  SUBJECTIVE STATEMENT:  Pt states she's doing well, but not fantastic. Both feet are bothering her today.   Pt accompanied by: self  PERTINENT HISTORY: HTN, HLD   From eval: Pt reports with her balance it is hard to explain. Pt has a lot of stumbles when she is turning around and is not very sure footed. Pt reports this has been going on for several months. Pt has not had any falls in the last 6 months but several stumbles. Pt reports it started with a UTI and it got very bad during this. Since then there has been a noticeable difference in her balance, speech and her over all and has had some difficulty with word finding. Pt has been doing a lot less and feels she needs to do more things.  Patient also reports she has a fear of bending down to pick things up at times although she is able to do it in the clinic she reports the other day she was too afraid to bend down to pick something up she needed off the ground and had to ask for assistance.  Edition patient feels she is unable to go up and down a full flight of steps and has absolute certainty of falling with here in her opinion.  PAIN:  Are you having pain? No  PRECAUTIONS: Fall  RED FLAGS: None   WEIGHT BEARING RESTRICTIONS: No  FALLS: Has patient fallen in last 6 months? No and several stumbles   LIVING ENVIRONMENT: Lives with: lives with their family and and has a dog  Lives in: House/apartment Stairs: Yes: External: 2 steps; can reach both Has  following equipment at home: None  PLOF: Independent and Independent with basic ADLs  PATIENT GOALS: Improve her overall mobility and safety with balance   OBJECTIVE:  Note: Objective measures were completed at Evaluation unless otherwise noted.  DIAGNOSTIC FINDINGS: n/a  COGNITION: Overall cognitive status: pt reports some memory and word finding impairment since UTI   SENSATION: Not tested    LOWER EXTREMITY ROM:     WFL for tasks assessed   LOWER EXTREMITY MMT:    MMT Right Eval Left Eval  Hip flexion 4+ 4+  Hip extension    Hip abduction 4+ 4+  Hip adduction 4+ 4+  Hip internal rotation    Hip external rotation    Knee flexion 4+ 4+  Knee extension 4+ 4+  Ankle dorsiflexion 4+ 4+  Ankle plantarflexion    Ankle inversion    Ankle eversion    (Blank rows = not tested)   TRANSFERS: Sit to stand: Complete Independence  Assistive device utilized: None     Stand to sit: Complete Independence  Assistive device utilized: None     Chair to chair: Complete Independence  Assistive device utilized: None       RAMP:  Not tested  CURB:  Not tested  STAIRS: Findings: Level of Assistance: Modified independence, Stair Negotiation Technique: Alternating Pattern  with Bilateral Rails, Number of Stairs: 4, Height of Stairs: 6 in   , and Comments: Is afraid of doing a flight of steps, feels as though she would fall.  GAIT: Findings: Distance walked: 50 ft and Comments: NBOS, unsteady at times   FUNCTIONAL TESTS:  5 times sit to stand: 15.69 sec  10 meter walk test: 12.99 sec = .76 m/s : 265 ft, mild imbalance at times, no AD  Dynamic Gait Index: Test visit 2       PATIENT SURVEYS:  ABC  scale: The Activities-Specific Balance Confidence (ABC) Scale 0% 10 20 30  40 50 60 70 80 90 100% No confidence<->completely confident  How confident are you that you will not lose your balance or become unsteady when you . . .   Date tested 06/29/24  Walk around the house  100%  2. Walk up or down stairs 100%  3. Bend over and pick up a slipper from in front of a closet floor 80%  4. Reach for a small can off a shelf at eye level 100%  5. Stand on tip toes and reach for something above your head 90%  6. Stand on a chair and reach for something 0%  7. Sweep the floor 100%  8. Walk outside the house to a car parked in the driveway 100%  9. Get into or out of a car 100%  10. Walk across a parking lot to the mall 70%  11. Walk up or down a ramp 80%  12. Walk in a crowded mall where people rapidly walk past you 100%  13. Are bumped into by people as you walk through the mall 80%  14. Step onto or off of an escalator while you are holding onto the railing 100%  15. Step onto or off an escalator while holding onto parcels such that you cannot hold onto the railing 100%  16. Walk outside on icy sidewalks 0%  Total: #/16 81.25%                                                                                                                                 TREATMENT DATE: 10/06/2024   TA- To improve functional movements patterns for everyday tasks    HR at start of session: 67 bpm   Nustep x 8 min rolling hill L 2-7 for UE and LE reciprocal movement training and for LE endurance  - Post nustep HR-69  Alternating bwd step with single UE support 2 x 10 ea LE  TE- To improve strength, endurance, mobility, and function of specific targeted muscle groups or improve joint range of motion or improve muscle flexibility   Resisted side stepping 2 x 8 laps along bar   NMR: To facilitate reeducation of movement, balance, posture, coordination, and/or proprioception/kinesthetic sense.   Wobble board lateral and a/p directions 2 x 30 ea  -Intermittent UE support in lateral facing direction  Standing airex NBOS with cross body reach to cone pick up 2 x 6 cones ea side  -Mild dizziness noted during first set, pt cued to complete using slower rotation with no further  dizziness reported.  Standing marches on airex 2 x10 ea LE  -Light UE support on bar  Unless otherwise stated, at least CGA was provided and gait belt donned in order to ensure pt safety.   Pt required occasional rest breaks due fatigue, PT was attentive to when pt appeared to be tired or winded in order  to prevent excessive fatigue. Pt instructed on pursed lip breathing to help with SOB.    PATIENT EDUCATION: Education details: POC Person educated: Patient Education method: Explanation Education comprehension: verbalized understanding   HOME EXERCISE PROGRAM: Access Code: [email or web address] URL: [email or web address] Date: 10/07/2024 Prepared by: Lonni Gainer  Exercises - Sit to Stand with Hands on Knees  - 1 x daily - 7 x weekly - 2 sets - 5 reps - Standing March with Counter Support  - 1 x daily - 7 x weekly - 2 sets - 10 reps - Narrow Stance with Counter Support - Vertical Head Nods  - 1 x daily - 7 x weekly - 2 sets - 10 reps - Walking  - 1 x daily - 7 x weekly - 2 sets - 5-8 reps - 1-2 minutes hold - Side Stepping with Resistance at Ankles  - 1 x daily - 7 x weekly - 2 sets - 10 reps - Alternating Step Backward with Support  - 1 x daily - 7 x weekly - 2 sets - 10 reps   GOALS: Goals reviewed with patient? No  SHORT TERM GOALS: Target date: 07/27/2024  Patient will be independent in home exercise program to improve strength/mobility for better functional independence with ADLs. Baseline: HEP provided 07/06/2024 Goal status: INITIAL  LONG TERM GOALS: Target date: 10/19/2024   1.  Patient will complete five times sit to stand test in < 13 seconds indicating an increased LE strength and improved balance. Baseline: 15.69 sec 10/28:11.2 sec  Goal status: MET  2.  Patient will improve ABC scale score to 90%   to demonstrate statistically significant improvement in mobility and quality of life as it relates to their balance confidence.  Baseline: 81.25%; 12/2:  58% Goal status: ONGOING   3.  Patient will increase Berg Balance score by > 6 points to demonstrate decreased fall risk during functional activities. Baseline: 46, 10/28 47/56; 12/2: 55/56 Goal status: MET   4.   Patient will improve DGI score by 4 points or greater to reduce fall risk and demonstrate improved dynamic balance. Baseline: 07/06/24: 13/24, 08/17/24: 15/24; 12/2: 17/24 Goal status: MET  5.   Patient will increase 10 meter walk test to >1.60m/s as to improve gait speed for better community ambulation and to reduce fall risk. Baseline: .76 m/s, 10/28: .55m/s, 12/02: 1.04 m/s Goal status: MET  6.   Patient will increase 2 minute walk test distance to >400 ft for progression to community ambulator and improve gait ability Baseline: 265 ft, 12/2 405 ft Goal status: MET  7.  Patient will report 60% confidence in reaching overhead for improved functional balance and safety in retrieving items from cabinets.  Baseline: 10% confidence per ABC scale Goal status: INITIAL   8.  Patient will be able to walk backwards 24ft without posterior LOB to demonstrate decreased fall risk with dynamic gait activities. Baseline: To be assessed at next visit Goal status: INITIAL   ASSESSMENT:  CLINICAL IMPRESSION:   Pt presents with good motivation for PT activities this session. Pts HEP was updated to reflect progress note goals to include bwd stepping with UE support and resisted side stepping. Remainder of session focused on balance activities including the wobble board and airex. Wobble board was most challenging in lateral facing direction and required intermittent UE support to regain balance. Standing marches were progressed at this session to include standing on airex pad. Pt unable to maintain balance with tendency to shift weight posteriorly so light  UE support was maintained throughout. Pt experienced mild dizziness with cross body rotation but when cued to move slowly this resolved.  Pt will continue to benefit from skilled physical therapy intervention to address impairments, improve QOL, and attain therapy goals.    OBJECTIVE IMPAIRMENTS: Abnormal gait, decreased activity tolerance, decreased balance, decreased endurance, difficulty walking, and decreased strength.   ACTIVITY LIMITATIONS: standing, stairs, and locomotion level  PARTICIPATION LIMITATIONS: laundry, shopping, community activity, and yard work  PERSONAL FACTORS: Age and 1-2 comorbidities: HLD, HTN are also affecting patient's functional outcome.   REHAB POTENTIAL: Good  CLINICAL DECISION MAKING: Evolving/moderate complexity  EVALUATION COMPLEXITY: Moderate  PLAN:  PT FREQUENCY: 1-2x/week  PT DURATION: 4 weeks  PLANNED INTERVENTIONS: 97750- Physical Performance Testing, 97110-Therapeutic exercises, 97530- Therapeutic activity, 97112- Neuromuscular re-education, 97535- Self Care, [id number]- Manual therapy, [phone]- Gait training, Balance training, Stair training, DME instructions, and Moist heat  PLAN FOR NEXT SESSION:   *monitor HR during session* Dynamic gait including: Turning Horizontal head turns Change in gait speed Retro transition Balance w/ rotational component  Bwd gait Overhead reaching activities Update HEP   Note: Portions of this document were prepared using Dragon voice recognition software and although reviewed may contain unintentional dictation errors in syntax, grammar, or spelling.   This entire session was performed under direct supervision and direction of a licensed therapist/therapist assistant . I have personally read, edited and approve of the note as written.    This licensed clinician was present and actively directing care throughout the session at all times.  [place], [place]   4:11 PM 10/06/2024

## 2024-10-12 ENCOUNTER — Ambulatory Visit: Admitting: Physical Therapy

## 2024-10-12 ENCOUNTER — Telehealth: Payer: Self-pay | Admitting: Physical Therapy

## 2024-10-12 NOTE — Telephone Encounter (Signed)
 Contacted pt regarding missed appointment on 12/23. She reported car trouble and forgot to call and cancel. States she plans to be here for next appointment on 10/20/24.

## 2024-10-20 ENCOUNTER — Ambulatory Visit: Admitting: Physical Therapy

## 2024-10-20 DIAGNOSIS — M6281 Muscle weakness (generalized): Secondary | ICD-10-CM

## 2024-10-20 DIAGNOSIS — R2681 Unsteadiness on feet: Secondary | ICD-10-CM

## 2024-10-20 DIAGNOSIS — R269 Unspecified abnormalities of gait and mobility: Secondary | ICD-10-CM | POA: Diagnosis not present

## 2024-10-20 DIAGNOSIS — R262 Difficulty in walking, not elsewhere classified: Secondary | ICD-10-CM

## 2024-10-20 NOTE — Therapy (Signed)
 " OUTPATIENT PHYSICAL THERAPY NEURO TREATMENT/RE-CERTIFICATION NOTE  Physical Therapy Progress Note   Dates of reporting period  08/17/24   to   10/20/24       Patient Name: Brenda Wiley MRN: [id number] DOB:11/07/1940, 83 y.o., female Today's Date: 10/20/2024   PCP:   Auston Reyes BIRCH, MD   REFERRING PROVIDER:   Auston Reyes BIRCH, MD    END OF SESSION:   PT End of Session - 10/20/24 1618     Visit Number 20    Number of Visits 24    Date for Recertification  10/19/24    Progress Note Due on Visit 20    PT Start Time 1615    PT Stop Time 1657    PT Time Calculation (min) 42 min    Equipment Utilized During Treatment Gait belt    Activity Tolerance Patient tolerated treatment well    Behavior During Therapy [facility] for tasks assessed/performed         Past Medical History:  Diagnosis Date   Anal sphincter tear (healed) (old)-anterior 04/13/2014   Basal cell carcinoma    Chronic headaches    resolved   Depression    loss of spouse   Diverticulosis    Dizzy spells 02/23/2015   X 1year   HLD (hyperlipidemia)    HTN (hypertension) 09/27/2023   Past Surgical History:  Procedure Laterality Date   AUGMENTATION MAMMAPLASTY Bilateral    silicone implants put in over 30 yeras ago   CESAREAN SECTION     x 2   COLONOSCOPY     PARTIAL HYSTERECTOMY     TONSILLECTOMY     Patient Active Problem List   Diagnosis Date Noted   Demand ischemia (HCC) 09/28/2023   Anxiety disorder 09/28/2023   E coli bacteremia 09/28/2023   Sepsis secondary to UTI (HCC) 09/27/2023   UTI (urinary tract infection) 09/27/2023   NSTEMI (non-ST elevated myocardial infarction) (HCC) 09/27/2023   Acute kidney injury superimposed on chronic kidney disease 09/27/2023   HTN (hypertension) 09/27/2023   Acute metabolic encephalopathy 09/27/2023   Hyponatremia 09/27/2023   Shortness of breath 02/17/2015   Smoker 02/17/2015   Hyperlipidemia 02/17/2015   Osteoarthritis of both hips 02/17/2015    Fecal incontinence due to anorectal disorder 04/13/2014   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 04/13/2014   ONSET DATE: 3 months approx  REFERRING DIAG: R26.89 (ICD-10-CM) - Balance disorder   THERAPY DIAG:  Abnormality of gait and mobility  Unsteadiness on feet  Difficulty in walking, not elsewhere classified  Muscle weakness (generalized)  Rationale for Evaluation and Treatment: Rehabilitation  SUBJECTIVE:  SUBJECTIVE STATEMENT:  Pt states she's has had a lot of trouble with her car and has caused her to miss several physical therapy appointments.  Pt accompanied by: self  PERTINENT HISTORY: HTN, HLD   From eval: Pt reports with her balance it is hard to explain. Pt has a lot of stumbles when she is turning around and is not very sure footed. Pt reports this has been going on for several months. Pt has not had any falls in the last 6 months but several stumbles. Pt reports it started with a UTI and it got very bad during this. Since then there has been a noticeable difference in her balance, speech and her over all and has had some difficulty with word finding. Pt has been doing a lot less and feels she needs to do more things.  Patient also reports she has a fear of bending down to pick things up at times although she is able to do it in the clinic she reports the other day she was too afraid to bend down to pick something up she needed off the ground and had to ask for assistance.  Edition patient feels she is unable to go up and down a full flight of steps and has absolute certainty of falling with here in her opinion.  PAIN:  Are you having pain? No  PRECAUTIONS: Fall  RED FLAGS: None   WEIGHT BEARING RESTRICTIONS: No  FALLS: Has patient fallen in last 6 months? No and several stumbles   LIVING  ENVIRONMENT: Lives with: lives with their family and and has a dog  Lives in: House/apartment Stairs: Yes: External: 2 steps; can reach both Has following equipment at home: None  PLOF: Independent and Independent with basic ADLs  PATIENT GOALS: Improve her overall mobility and safety with balance   OBJECTIVE:  Note: Objective measures were completed at Evaluation unless otherwise noted.  DIAGNOSTIC FINDINGS: n/a  COGNITION: Overall cognitive status: pt reports some memory and word finding impairment since UTI   SENSATION: Not tested    LOWER EXTREMITY ROM:     WFL for tasks assessed   LOWER EXTREMITY MMT:    MMT Right Eval Left Eval  Hip flexion 4+ 4+  Hip extension    Hip abduction 4+ 4+  Hip adduction 4+ 4+  Hip internal rotation    Hip external rotation    Knee flexion 4+ 4+  Knee extension 4+ 4+  Ankle dorsiflexion 4+ 4+  Ankle plantarflexion    Ankle inversion    Ankle eversion    (Blank rows = not tested)   TRANSFERS: Sit to stand: Complete Independence  Assistive device utilized: None     Stand to sit: Complete Independence  Assistive device utilized: None     Chair to chair: Complete Independence  Assistive device utilized: None       RAMP:  Not tested  CURB:  Not tested  STAIRS: Findings: Level of Assistance: Modified independence, Stair Negotiation Technique: Alternating Pattern  with Bilateral Rails, Number of Stairs: 4, Height of Stairs: 6 in   , and Comments: Is afraid of doing a flight of steps, feels as though she would fall.  GAIT: Findings: Distance walked: 50 ft and Comments: NBOS, unsteady at times   FUNCTIONAL TESTS:  5 times sit to stand: 15.69 sec  10 meter walk test: 12.99 sec = .76 m/s : 265 ft, mild imbalance at times, no AD  Dynamic Gait Index: Test visit 2  PATIENT SURVEYS:  ABC scale: The Activities-Specific Balance Confidence (ABC) Scale 0% 10 20 30  40 50 60 70 80 90 100% No confidence<->completely  confident  How confident are you that you will not lose your balance or become unsteady when you . . .   Date tested 06/29/24  Walk around the house 100%  2. Walk up or down stairs 100%  3. Bend over and pick up a slipper from in front of a closet floor 80%  4. Reach for a small can off a shelf at eye level 100%  5. Stand on tip toes and reach for something above your head 90%  6. Stand on a chair and reach for something 0%  7. Sweep the floor 100%  8. Walk outside the house to a car parked in the driveway 100%  9. Get into or out of a car 100%  10. Walk across a parking lot to the mall 70%  11. Walk up or down a ramp 80%  12. Walk in a crowded mall where people rapidly walk past you 100%  13. Are bumped into by people as you walk through the mall 80%  14. Step onto or off of an escalator while you are holding onto the railing 100%  15. Step onto or off an escalator while holding onto parcels such that you cannot hold onto the railing 100%  16. Walk outside on icy sidewalks 0%  Total: #/16 81.25%                                                                                                                                 TREATMENT DATE: 10/20/2024   TA- To improve functional movements patterns for everyday tasks    Nustep x 8 min  L1-2 ( old nustep) for B UE and LE reciprocal movement training   Alternating forward and retro step over hurdles x 10 ea - cues for no UE support   TE- To improve strength, endurance, mobility, and function of specific targeted muscle groups or improve joint range of motion or improve muscle flexibility   Resisted side stepping with GTB around kneex 2 x 15 steps ea direction   NMR: To facilitate reeducation of movement, balance, posture, coordination, and/or proprioception/kinesthetic sense.   Wobble board lateral and a/p directions 2 x 30 movements ea - no UE  Standing marches on airex 2 x10 ea LE  -Light UE support on bar  Unless otherwise  stated, at least CGA was provided and gait belt donned in order to ensure pt safety.   Pt required occasional rest breaks due fatigue, PT was attentive to when pt appeared to be tired or winded in order to prevent excessive fatigue. Pt instructed on pursed lip breathing to help with SOB.    PATIENT EDUCATION: Education details: POC Person educated: Patient Education method: Explanation Education comprehension: verbalized understanding   HOME EXERCISE PROGRAM: Access Code: [email or web address] URL: [email or web address] Date:  10/07/2024 Prepared by: Lonni Gainer  Exercises - Sit to Stand with Hands on Knees  - 1 x daily - 7 x weekly - 2 sets - 5 reps - Standing March with Counter Support  - 1 x daily - 7 x weekly - 2 sets - 10 reps - Narrow Stance with Counter Support - Vertical Head Nods  - 1 x daily - 7 x weekly - 2 sets - 10 reps - Walking  - 1 x daily - 7 x weekly - 2 sets - 5-8 reps - 1-2 minutes hold - Side Stepping with Resistance at Ankles  - 1 x daily - 7 x weekly - 2 sets - 10 reps - Alternating Step Backward with Support  - 1 x daily - 7 x weekly - 2 sets - 10 reps   GOALS: Goals reviewed with patient? No  SHORT TERM GOALS: Target date: 07/27/2024  Patient will be independent in home exercise program to improve strength/mobility for better functional independence with ADLs. Baseline: HEP provided 9/16/202512/31: Patient completing regularly other than this past week  Goal status: met  LONG TERM GOALS: Target date: 11/17/2024   1.  Patient will complete five times sit to stand test in < 13 seconds indicating an increased LE strength and improved balance. Baseline: 15.69 sec 10/28:11.2 sec  Goal status: MET  2.  Patient will improve ABC scale score to 90%   to demonstrate statistically significant improvement in mobility and quality of life as it relates to their balance confidence.  Baseline: 81.25%; 12/2: 58% Goal status: ONGOING   3.  Patient will  increase Berg Balance score by > 6 points to demonstrate decreased fall risk during functional activities. Baseline: 46, 10/28 47/56; 12/2: 55/56 Goal status: MET   4.   Patient will improve DGI score by 4 points or greater to reduce fall risk and demonstrate improved dynamic balance. Baseline: 07/06/24: 13/24, 08/17/24: 15/24; 12/2: 17/24 Goal status: MET  5.   Patient will increase 10 meter walk test to >1.50m/s as to improve gait speed for better community ambulation and to reduce fall risk. Baseline: .76 m/s, 10/28: .60m/s, 12/02: 1.04 m/s Goal status: MET  6.   Patient will increase 2 minute walk test distance to >400 ft for progression to community ambulator and improve gait ability Baseline: 265 ft, 12/2 405 ft Goal status: MET  7.  Patient will report 60% confidence in reaching overhead for improved functional balance and safety in retrieving items from cabinets.  Baseline: 10% confidence per ABC scale Goal status: INITIAL   8.  Patient will be able to walk backwards 62ft without posterior LOB to demonstrate decreased fall risk with dynamic gait activities. Baseline: To be assessed at next visit 12/31: completes with cues for form Goal status: IN PROGRESS   ASSESSMENT:  CLINICAL IMPRESSION:   Patient presents for progress note and recertification this date.  Patient previously assessed 2 visits prior for recertification but patient had to miss a lot of physical therapy due to unforeseen forked circumstances since that time.  Due to this patient's plan of care was unable to be completed in am suggesting further recertification and treatment for another 4 weeks for 1 time per week.  Patient is still making progress toward her goals and was able to walk backward 20 feet this date without posterior loss of balance showing improvement in function toward her long-term goals. Patient's condition has the potential to improve in response to therapy. Maximum improvement is yet to  be  obtained. The anticipated improvement is attainable and reasonable in a generally predictable time. Pt will continue to benefit from skilled physical therapy intervention to address impairments, improve QOL, and attain therapy goals.    OBJECTIVE IMPAIRMENTS: Abnormal gait, decreased activity tolerance, decreased balance, decreased endurance, difficulty walking, and decreased strength.   ACTIVITY LIMITATIONS: standing, stairs, and locomotion level  PARTICIPATION LIMITATIONS: laundry, shopping, community activity, and yard work  PERSONAL FACTORS: Age and 1-2 comorbidities: HLD, HTN are also affecting patient's functional outcome.   REHAB POTENTIAL: Good  CLINICAL DECISION MAKING: Evolving/moderate complexity  EVALUATION COMPLEXITY: Moderate  PLAN:  PT FREQUENCY: 1-2x/week  PT DURATION: 4 weeks  PLANNED INTERVENTIONS: 97750- Physical Performance Testing, 97110-Therapeutic exercises, 97530- Therapeutic activity, 97112- Neuromuscular re-education, 97535- Self Care, [id number]- Manual therapy, [phone]- Gait training, Balance training, Stair training, DME instructions, and Moist heat  PLAN FOR NEXT SESSION:   *monitor HR during session* Dynamic gait including: Turning Horizontal head turns Change in gait speed Retro transition Balance w/ rotational component  Bwd gait Overhead reaching activities Update HEP   Note: Portions of this document were prepared using Dragon voice recognition software and although reviewed may contain unintentional dictation errors in syntax, grammar, or spelling.   Lonni Gainer PT, DPT    4:18 PM 10/20/2024  "

## 2024-10-26 ENCOUNTER — Ambulatory Visit: Payer: Self-pay | Attending: Internal Medicine | Admitting: Physical Therapy

## 2024-10-26 DIAGNOSIS — M6281 Muscle weakness (generalized): Secondary | ICD-10-CM | POA: Insufficient documentation

## 2024-10-26 DIAGNOSIS — R251 Tremor, unspecified: Secondary | ICD-10-CM | POA: Diagnosis present

## 2024-10-26 DIAGNOSIS — R269 Unspecified abnormalities of gait and mobility: Secondary | ICD-10-CM | POA: Diagnosis present

## 2024-10-26 DIAGNOSIS — R278 Other lack of coordination: Secondary | ICD-10-CM | POA: Diagnosis present

## 2024-10-26 DIAGNOSIS — R2681 Unsteadiness on feet: Secondary | ICD-10-CM | POA: Insufficient documentation

## 2024-10-26 DIAGNOSIS — R262 Difficulty in walking, not elsewhere classified: Secondary | ICD-10-CM | POA: Insufficient documentation

## 2024-10-26 NOTE — Therapy (Signed)
 " OUTPATIENT PHYSICAL THERAPY NEURO TREATMENT     Patient Name: Brenda Wiley MRN: [id number] DOB:02-09-1941, 84 y.o., female Today's Date: 10/26/2024   PCP:   Auston Reyes BIRCH, MD   REFERRING PROVIDER:   Auston Reyes BIRCH, MD    END OF SESSION:   PT End of Session - 10/26/24 1454     Visit Number 21    Number of Visits 24    Date for Recertification  10/19/24    Progress Note Due on Visit 30    PT Start Time 1449    PT Stop Time 1528    PT Time Calculation (min) 39 min    Equipment Utilized During Treatment Gait belt    Activity Tolerance Patient tolerated treatment well    Behavior During Therapy [facility] for tasks assessed/performed          Past Medical History:  Diagnosis Date   Anal sphincter tear (healed) (old)-anterior 04/13/2014   Basal cell carcinoma    Chronic headaches    resolved   Depression    loss of spouse   Diverticulosis    Dizzy spells 02/23/2015   X 1year   HLD (hyperlipidemia)    HTN (hypertension) 09/27/2023   Past Surgical History:  Procedure Laterality Date   AUGMENTATION MAMMAPLASTY Bilateral    silicone implants put in over 30 yeras ago   CESAREAN SECTION     x 2   COLONOSCOPY     PARTIAL HYSTERECTOMY     TONSILLECTOMY     Patient Active Problem List   Diagnosis Date Noted   Demand ischemia (HCC) 09/28/2023   Anxiety disorder 09/28/2023   E coli bacteremia 09/28/2023   Sepsis secondary to UTI (HCC) 09/27/2023   UTI (urinary tract infection) 09/27/2023   NSTEMI (non-ST elevated myocardial infarction) (HCC) 09/27/2023   Acute kidney injury superimposed on chronic kidney disease 09/27/2023   HTN (hypertension) 09/27/2023   Acute metabolic encephalopathy 09/27/2023   Hyponatremia 09/27/2023   Shortness of breath 02/17/2015   Smoker 02/17/2015   Hyperlipidemia 02/17/2015   Osteoarthritis of both hips 02/17/2015   Fecal incontinence due to anorectal disorder 04/13/2014   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 04/13/2014   ONSET DATE: 3  months approx  REFERRING DIAG: R26.89 (ICD-10-CM) - Balance disorder   THERAPY DIAG:  Abnormality of gait and mobility  Unsteadiness on feet  Difficulty in walking, not elsewhere classified  Muscle weakness (generalized)  Rationale for Evaluation and Treatment: Rehabilitation  SUBJECTIVE:  SUBJECTIVE STATEMENT:  Pt had a fall since last session. She was stepping backwards after unloading laundry and slipped ( was wearing socks).   Pt accompanied by: self  PERTINENT HISTORY: HTN, HLD   From eval: Pt reports with her balance it is hard to explain. Pt has a lot of stumbles when she is turning around and is not very sure footed. Pt reports this has been going on for several months. Pt has not had any falls in the last 6 months but several stumbles. Pt reports it started with a UTI and it got very bad during this. Since then there has been a noticeable difference in her balance, speech and her over all and has had some difficulty with word finding. Pt has been doing a lot less and feels she needs to do more things.  Patient also reports she has a fear of bending down to pick things up at times although she is able to do it in the clinic she reports the other day she was too afraid to bend down to pick something up she needed off the ground and had to ask for assistance.  Edition patient feels she is unable to go up and down a full flight of steps and has absolute certainty of falling with here in her opinion.  PAIN:  Are you having pain? No  PRECAUTIONS: Fall  RED FLAGS: None   WEIGHT BEARING RESTRICTIONS: No  FALLS: Has patient fallen in last 6 months? No and several stumbles   LIVING ENVIRONMENT: Lives with: lives with their family and and has a dog  Lives in: House/apartment Stairs: Yes:  External: 2 steps; can reach both Has following equipment at home: None  PLOF: Independent and Independent with basic ADLs  PATIENT GOALS: Improve her overall mobility and safety with balance   OBJECTIVE:  Note: Objective measures were completed at Evaluation unless otherwise noted.  DIAGNOSTIC FINDINGS: n/a  COGNITION: Overall cognitive status: pt reports some memory and word finding impairment since UTI   SENSATION: Not tested    LOWER EXTREMITY ROM:     WFL for tasks assessed   LOWER EXTREMITY MMT:    MMT Right Eval Left Eval  Hip flexion 4+ 4+  Hip extension    Hip abduction 4+ 4+  Hip adduction 4+ 4+  Hip internal rotation    Hip external rotation    Knee flexion 4+ 4+  Knee extension 4+ 4+  Ankle dorsiflexion 4+ 4+  Ankle plantarflexion    Ankle inversion    Ankle eversion    (Blank rows = not tested)   TRANSFERS: Sit to stand: Complete Independence  Assistive device utilized: None     Stand to sit: Complete Independence  Assistive device utilized: None     Chair to chair: Complete Independence  Assistive device utilized: None       RAMP:  Not tested  CURB:  Not tested  STAIRS: Findings: Level of Assistance: Modified independence, Stair Negotiation Technique: Alternating Pattern  with Bilateral Rails, Number of Stairs: 4, Height of Stairs: 6 in   , and Comments: Is afraid of doing a flight of steps, feels as though she would fall.  GAIT: Findings: Distance walked: 50 ft and Comments: NBOS, unsteady at times   FUNCTIONAL TESTS:   5 times sit to stand: 15.69 sec  10 meter walk test: 12.99 sec = .76 m/s : 265 ft, mild imbalance at times, no AD  Dynamic Gait Index: Test visit 2   PATIENT  SURVEYS:  ABC scale: The Activities-Specific Balance Confidence (ABC) Scale 0% 10 20 30  40 50 60 70 80 90 100% No confidence<->completely confident  How confident are you that you will not lose your balance or become unsteady when you . . .   Date tested  06/29/24  Walk around the house 100%  2. Walk up or down stairs 100%  3. Bend over and pick up a slipper from in front of a closet floor 80%  4. Reach for a small can off a shelf at eye level 100%  5. Stand on tip toes and reach for something above your head 90%  6. Stand on a chair and reach for something 0%  7. Sweep the floor 100%  8. Walk outside the house to a car parked in the driveway 100%  9. Get into or out of a car 100%  10. Walk across a parking lot to the mall 70%  11. Walk up or down a ramp 80%  12. Walk in a crowded mall where people rapidly walk past you 100%  13. Are bumped into by people as you walk through the mall 80%  14. Step onto or off of an escalator while you are holding onto the railing 100%  15. Step onto or off an escalator while holding onto parcels such that you cannot hold onto the railing 100%  16. Walk outside on icy sidewalks 0%  Total: #/16 81.25%                                                                                                                                 TREATMENT DATE: 10/26/2024    TA- To improve functional movements patterns for everyday tasks    Nustep x 8 min  L1-2 ( old nustep) for B UE and LE reciprocal movement training   Alternating forward and retro step over hurdles 2 x 10 ea - no UE support, improved foot clearance compared to last session  NMR: To facilitate reeducation of movement, balance, posture, coordination, and/or proprioception/kinesthetic sense.   Basketball reach x 10 to left then dunk for slight overhead reach x 10 ea side while standing on airex pad   Airex step taps 2 x 10 ea   Retro walking transition practice x 5 min - cues for foot strike pattern - pt calls out when to transition to practice with more realistic scenarios of retro stepping  Self Care  Discussed safety of wearing socks on slick surfaces. Discussed in detail types of socks or footwear to consider to prevent these types of falls in the  future and pt receptive and verbalizes understanding.   BP 138/78 - taken manually due to discomfort with machine when testing   Unless otherwise stated, at least CGA was provided and gait belt donned in order to ensure pt safety.   Pt required occasional rest breaks due fatigue, PT was attentive to when pt appeared to  be tired or winded in order to prevent excessive fatigue. Pt instructed on pursed lip breathing to help with SOB.    PATIENT EDUCATION: Education details: POC Person educated: Patient Education method: Explanation Education comprehension: verbalized understanding   HOME EXERCISE PROGRAM: Access Code: [email or web address] URL: [email or web address] Date: 10/07/2024 Prepared by: Lonni Gainer  Exercises - Sit to Stand with Hands on Knees  - 1 x daily - 7 x weekly - 2 sets - 5 reps - Standing March with Counter Support  - 1 x daily - 7 x weekly - 2 sets - 10 reps - Narrow Stance with Counter Support - Vertical Head Nods  - 1 x daily - 7 x weekly - 2 sets - 10 reps - Walking  - 1 x daily - 7 x weekly - 2 sets - 5-8 reps - 1-2 minutes hold - Side Stepping with Resistance at Ankles  - 1 x daily - 7 x weekly - 2 sets - 10 reps - Alternating Step Backward with Support  - 1 x daily - 7 x weekly - 2 sets - 10 reps   GOALS: Goals reviewed with patient? No  SHORT TERM GOALS: Target date: 07/27/2024  Patient will be independent in home exercise program to improve strength/mobility for better functional independence with ADLs. Baseline: HEP provided 9/16/202512/31: Patient completing regularly other than this past week  Goal status: met  LONG TERM GOALS: Target date: 11/17/2024   1.  Patient will complete five times sit to stand test in < 13 seconds indicating an increased LE strength and improved balance. Baseline: 15.69 sec 10/28:11.2 sec  Goal status: MET  2.  Patient will improve ABC scale score to 90%   to demonstrate statistically significant improvement in  mobility and quality of life as it relates to their balance confidence.  Baseline: 81.25%; 12/2: 58% Goal status: ONGOING   3.  Patient will increase Berg Balance score by > 6 points to demonstrate decreased fall risk during functional activities. Baseline: 46, 10/28 47/56; 12/2: 55/56 Goal status: MET   4.   Patient will improve DGI score by 4 points or greater to reduce fall risk and demonstrate improved dynamic balance. Baseline: 07/06/24: 13/24, 08/17/24: 15/24; 12/2: 17/24 Goal status: MET  5.   Patient will increase 10 meter walk test to >1.23m/s as to improve gait speed for better community ambulation and to reduce fall risk. Baseline: .76 m/s, 10/28: .22m/s, 12/02: 1.04 m/s Goal status: MET  6.   Patient will increase 2 minute walk test distance to >400 ft for progression to community ambulator and improve gait ability Baseline: 265 ft, 12/2 405 ft Goal status: MET  7.  Patient will report 60% confidence in reaching overhead for improved functional balance and safety in retrieving items from cabinets.  Baseline: 10% confidence per ABC scale Goal status: INITIAL   8.  Patient will be able to walk backwards 76ft without posterior LOB to demonstrate decreased fall risk with dynamic gait activities. Baseline: To be assessed at next visit 12/31: completes with cues for form Goal status: IN PROGRESS   ASSESSMENT:  CLINICAL IMPRESSION:   Patient arrived with good motivation for completion of pt activities.  Pt and PT discussed fall prevention strategies within home and importance of continuing with HEP. Pt showed improvement with retro walking this date with cues. Pt will continue to benefit from skilled physical therapy intervention to address impairments, improve QOL, and attain therapy goals.      OBJECTIVE IMPAIRMENTS: Abnormal gait,  decreased activity tolerance, decreased balance, decreased endurance, difficulty walking, and decreased strength.   ACTIVITY LIMITATIONS:  standing, stairs, and locomotion level  PARTICIPATION LIMITATIONS: laundry, shopping, community activity, and yard work  PERSONAL FACTORS: Age and 1-2 comorbidities: HLD, HTN are also affecting patient's functional outcome.   REHAB POTENTIAL: Good  CLINICAL DECISION MAKING: Evolving/moderate complexity  EVALUATION COMPLEXITY: Moderate  PLAN:  PT FREQUENCY: 1-2x/week  PT DURATION: 4 weeks  PLANNED INTERVENTIONS: 97750- Physical Performance Testing, 97110-Therapeutic exercises, 97530- Therapeutic activity, 97112- Neuromuscular re-education, 97535- Self Care, 02859- Manual therapy, [phone]- Gait training, Balance training, Stair training, DME instructions, and Moist heat  PLAN FOR NEXT SESSION:   *monitor HR during session* Dynamic gait including: Turning Horizontal head turns Change in gait speed Retro transition Balance w/ rotational component  Bwd gait Overhead reaching activities Update HEP   Note: Portions of this document were prepared using Dragon voice recognition software and although reviewed may contain unintentional dictation errors in syntax, grammar, or spelling.   Lonni Gainer PT, DPT    2:59 PM 10/26/2024  "

## 2024-11-04 ENCOUNTER — Ambulatory Visit: Payer: Self-pay

## 2024-11-04 ENCOUNTER — Ambulatory Visit: Payer: Self-pay | Admitting: Physical Therapy

## 2024-11-04 DIAGNOSIS — R269 Unspecified abnormalities of gait and mobility: Secondary | ICD-10-CM

## 2024-11-04 DIAGNOSIS — R262 Difficulty in walking, not elsewhere classified: Secondary | ICD-10-CM

## 2024-11-04 DIAGNOSIS — R251 Tremor, unspecified: Secondary | ICD-10-CM

## 2024-11-04 DIAGNOSIS — R278 Other lack of coordination: Secondary | ICD-10-CM

## 2024-11-04 DIAGNOSIS — M6281 Muscle weakness (generalized): Secondary | ICD-10-CM

## 2024-11-04 DIAGNOSIS — R2681 Unsteadiness on feet: Secondary | ICD-10-CM

## 2024-11-04 NOTE — Therapy (Signed)
 " OUTPATIENT OCCUPATIONAL THERAPY NEURO EVALUATION  Patient Name: Brenda Wiley MRN: [id number] DOB:1941/02/26, 84 y.o., female Today's Date: 11/07/2024  PCP: Dr. Reyes Costa  REFERRING PROVIDER: Dr. Reyes Costa   END OF SESSION:  OT End of Session - 11/07/24 1817     Visit Number 1    Number of Visits 24    Date for Recertification  01/27/25    Progress Note Due on Visit 10    OT Start Time 1445    OT Stop Time 1530    OT Time Calculation (min) 45 min    Activity Tolerance Patient tolerated treatment well    Behavior During Therapy [facility] for tasks assessed/performed          Past Medical History:  Diagnosis Date   Anal sphincter tear (healed) (old)-anterior 04/13/2014   Basal cell carcinoma    Chronic headaches    resolved   Depression    loss of spouse   Diverticulosis    Dizzy spells 02/23/2015   X 1year   HLD (hyperlipidemia)    HTN (hypertension) 09/27/2023   Past Surgical History:  Procedure Laterality Date   AUGMENTATION MAMMAPLASTY Bilateral    silicone implants put in over 30 yeras ago   CESAREAN SECTION     x 2   COLONOSCOPY     PARTIAL HYSTERECTOMY     TONSILLECTOMY     Patient Active Problem List   Diagnosis Date Noted   Demand ischemia (HCC) 09/28/2023   Anxiety disorder 09/28/2023   E coli bacteremia 09/28/2023   Sepsis secondary to UTI (HCC) 09/27/2023   UTI (urinary tract infection) 09/27/2023   NSTEMI (non-ST elevated myocardial infarction) (HCC) 09/27/2023   Acute kidney injury superimposed on chronic kidney disease 09/27/2023   HTN (hypertension) 09/27/2023   Acute metabolic encephalopathy 09/27/2023   Hyponatremia 09/27/2023   Shortness of breath 02/17/2015   Smoker 02/17/2015   Hyperlipidemia 02/17/2015   Osteoarthritis of both hips 02/17/2015   Fecal incontinence due to anorectal disorder 04/13/2014   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 04/13/2014   ONSET DATE: 2-3 weeks ago   REFERRING DIAG:  R29.898 (ICD-10-CM) - Hand weakness   R25.1 (ICD-10-CM) - Tremor   THERAPY DIAG:  Muscle weakness (generalized)  Other lack of coordination  Tremor of right hand  Rationale for Evaluation and Treatment: Rehabilitation  SUBJECTIVE:   SUBJECTIVE STATEMENT: I noticed the weakness in my hands and arms when I could lift the hook of my car.  Pt accompanied by: self  PERTINENT HISTORY: L hand tremor started in the thumb about 2 or 3 years ago, and has now moved into the IF.  Pt reports tremor worsens with stress.  Pt endorses increased weakness in the hands, noticed the extent of the weakness when she couldn't open the hood of her car.    PRECAUTIONS: Fall  WEIGHT BEARING RESTRICTIONS: No  PAIN:  Are you having pain? No, other than pain in the toe; L foot small toe (addressed by podiatrist)  FALLS: Has patient fallen in last 6 months? Yes. Number of falls 1 (fell in the washroom the other day; pt reports she bruised her L hip and L arm)  LIVING ENVIRONMENT: Lives with: lives alone; widowed Lives in: 1 level home Stairs: Yes: External: 2 steps; can reach both Has following equipment at home: shower chair, Grab bars, and pt reports she has a commode chair but doesn't use it.   PLOF: Independent including driving short distances, ADLs/IADLs  PATIENT GOALS: To improve  BUE strength for daily tasks.   OBJECTIVE:  Note: Objective measures were completed at Evaluation unless otherwise noted.  HAND DOMINANCE: Left  ADLs: Overall ADLs: L hand tremor beginning to limit FM components of ADLs Transfers/ambulation related to ADLs: modified indep-indep Eating: getting clumsy with eating utensils but does still eat with dominant hand Grooming: indep UB Dressing: difficulty turning bra around after hooking bra in front  LB Dressing: indep  Toileting: indep  Bathing: experiences UE fatigue when washing hair  Tub Shower transfers: modified indep Equipment: see above  IADLs: Shopping: Daughter orders groceries  Light  housekeeping: pt has a paid house cleaner; recommended reacher for gathering clothes from back of dryer  Meal Prep: Meals on wheels 1x daily, fixes a sandwich without difficulty Community mobility: indep without AD Medication management: uses weekly pill organizer and reports no difficulties with easy open tops  Financial management: Most bills on autopay and sister manages most of pt's bills  Handwriting: 75% legible  FUNCTIONAL OUTCOME MEASURES: MAM-20 for neurological conditions: TBD  UPPER EXTREMITY ROM:    Active ROM Right eval Left eval  Shoulder flexion 128 113  Shoulder abduction 120 116  Shoulder adduction    Shoulder extension    Shoulder internal rotation [facility] WFL  Shoulder external rotation [facility] WFL  Elbow flexion    Elbow extension    Wrist flexion    Wrist extension    Wrist ulnar deviation    Wrist radial deviation    Wrist pronation    Wrist supination    (Blank rows = not tested)  UPPER EXTREMITY MMT:     MMT Right eval Left eval  Shoulder flexion 5 5  Shoulder abduction 4+ 4+  Shoulder adduction    Shoulder extension    Shoulder internal rotation 4+ 4+  Shoulder external rotation 4+ 4+  Middle trapezius    Lower trapezius    Elbow flexion 5 5  Elbow extension 5 5  Wrist flexion 5 5  Wrist extension 5 5  Wrist ulnar deviation    Wrist radial deviation    Wrist pronation    Wrist supination    (Blank rows = not tested)  HAND FUNCTION:  Pt specific age range norms (weak <32.4 lbs, strong >54.0 lbs) Grip strength: Right: 35 lbs; Left: 27 lbs, Lateral pinch: Right: 12 lbs, Left: 10 lbs, and 3 point pinch: Right: 12 lbs, Left: 7 lbs  COORDINATION: 9 Hole Peg test: Right: 39 sec; Left: 38 sec  SENSATION: WFL  EDEMA: No visible edema  MUSCLE TONE: WNL  COGNITION: Overall cognitive status: Within functional limits for tasks assessed  VISION: Subjective report: wears glasses  PRAXIS: WFL  OBSERVATIONS: Collapse deformities of bilat  thumbs, ulnar drift present at the MCP joints of all digits in each hand                                                                                                 TREATMENT DATE: 11/04/24 Evaluation completed.  Self Care: -Review of OT role, goals, poc.   -Pt reports possibly needing to buy new dryer d/t  difficulty reaching low to back of dryer.  Recommended pt try use of reacher or a cane to gather clothing from back of dryer while reducing fall risk.  Pt receptive.   PATIENT EDUCATION: Education details: see above Person educated: Patient Education method: Explanation Education comprehension: verbalized understanding  HOME EXERCISE PROGRAM: To be initiated in upcoming sessions  GOALS: Goals reviewed with patient? Yes  SHORT TERM GOALS: Target date: 12/16/24  Pt will be indep to perform HEP for improving bilat hand strength, coordination, and shoulder flexibility for daily tasks. Baseline: Eval: HEP not yet initiated. Goal status: INITIAL  2.  Pt will identify 1-2 activity modifications or pieces of AE to manage tremors for improved efficiency with FM components of ADLs.  Baseline: Eval: Education not yet initiated. Goal status: INITIAL  LONG TERM GOALS: Target date: 01/27/25  Pt will increase MAM-20 score for neurological conditions by (TBD) or more points to indicate improvement in self perceived functional use of the L hand for daily tasks.  Baseline: Eval: TBD Goal status: INITIAL  2.  Pt will increase L grip strength by 10 or more lbs, R grip strength by 5 or more lbs, to adequately meet age range grip strength norms, while enabling greater indep with opening/closing car hood, trunk, and doors.  Baseline: Eval: L grip 27 lbs, R grip 35 lbs (non-dominant) Goal status: INITIAL  3.  Pt will increase active bilat shoulder flex/abd to [facility] for ease of washing hair. Baseline: Eval: Limited bilat shoulder flex/abd (see ROM above), with pt reporting fatigue in BUEs when  washing hair Goal status: INITIAL  4.  Pt will increase bilat [facility] skills for daily tasks as demonstrated by completion of 9 hole peg test in <30 sec each hand. Baseline: Eval: L 38 sec, R 39 sec Goal status: INITIAL  ASSESSMENT:  CLINICAL IMPRESSION: Patient is an 84 y.o. female who was seen today for occupational therapy evaluation for functional decline related to L hand tremor (dominant hand) and bilat hand weakness.  Pt reports she first noticed the extent of her weakness when she was unable to open the hood of her car.  Pt lives alone and is indep at baseline with ADLs and driving.  Pt endorses increased clumsiness with FM components of ADLs related to tremor in L dominant hand, including increased clumsiness with manipulation of eating utensils and noting a decline in handwriting.  Pt presents with good proximal strength, but falls below age range norms for dominant L grip strength, to which pt has encountered difficulties in opening the hood of her car, as well as securely holding and transporting ADL supplies in either hand, and difficulty opening bottles and containers.  Pt will benefit from skilled OT to address above noted deficits, working towards maximizing indep with daily tasks defined above.  PERFORMANCE DEFICITS: in functional skills including ADLs, IADLs, coordination, dexterity, ROM, strength, flexibility, Fine motor control, Gross motor control, balance, body mechanics, decreased knowledge of precautions, decreased knowledge of use of DME, and UE functional use, and psychosocial skills including coping strategies, environmental adaptation, habits, and routines and behaviors.   IMPAIRMENTS: are limiting patient from ADLs, IADLs, and leisure.   CO-MORBIDITIES: has co-morbidities such as depression, anxiety, HTN, OA that affects occupational performance. Patient will benefit from skilled OT to address above impairments and improve overall function.  MODIFICATION OR ASSISTANCE TO  COMPLETE EVALUATION: No modification of tasks or assist necessary to complete an evaluation.  OT OCCUPATIONAL PROFILE AND HISTORY: Detailed assessment: Review of records  and additional review of physical, cognitive, psychosocial history related to current functional performance.  CLINICAL DECISION MAKING: Moderate - several treatment options, min-mod task modification necessary  REHAB POTENTIAL: Good  EVALUATION COMPLEXITY: Moderate    PLAN:  OT FREQUENCY: 2x/week  OT DURATION: 12 weeks  PLANNED INTERVENTIONS: 97168 OT Re-evaluation, 97535 self care/ADL training, 02889 therapeutic exercise, 97530 therapeutic activity, 97112 neuromuscular re-education, 97140 manual therapy, 97116 gait training, 02989 moist heat, 97750 Physical Performance Testing, passive range of motion, balance training, functional mobility training, psychosocial skills training, coping strategies training, patient/family education, and DME and/or AE instructions  RECOMMENDED OTHER SERVICES: None at this time (multi-discipline with PT currently for balance)  CONSULTED AND AGREED WITH PLAN OF CARE: Patient  PLAN FOR NEXT SESSION: see above  Brenda Blazing, MS, OTR/L   Brenda Wiley, OT 11/07/2024, 6:19 PM           "

## 2024-11-04 NOTE — Therapy (Signed)
 " OUTPATIENT PHYSICAL THERAPY NEURO TREATMENT     Patient Name: Brenda Wiley MRN: [id number] DOB:01/16/1941, 84 y.o., female Today's Date: 11/04/2024   PCP:   Auston Reyes BIRCH, MD   REFERRING PROVIDER:   Auston Reyes BIRCH, MD    END OF SESSION:   PT End of Session - 11/04/24 1401     Visit Number 22    Number of Visits 30    Date for Recertification  10/19/24    Progress Note Due on Visit 30    PT Start Time 1401    PT Stop Time 1442    PT Time Calculation (min) 41 min    Equipment Utilized During Treatment Gait belt    Activity Tolerance Patient tolerated treatment well    Behavior During Therapy [facility] for tasks assessed/performed          Past Medical History:  Diagnosis Date   Anal sphincter tear (healed) (old)-anterior 04/13/2014   Basal cell carcinoma    Chronic headaches    resolved   Depression    loss of spouse   Diverticulosis    Dizzy spells 02/23/2015   X 1year   HLD (hyperlipidemia)    HTN (hypertension) 09/27/2023   Past Surgical History:  Procedure Laterality Date   AUGMENTATION MAMMAPLASTY Bilateral    silicone implants put in over 30 yeras ago   CESAREAN SECTION     x 2   COLONOSCOPY     PARTIAL HYSTERECTOMY     TONSILLECTOMY     Patient Active Problem List   Diagnosis Date Noted   Demand ischemia (HCC) 09/28/2023   Anxiety disorder 09/28/2023   E coli bacteremia 09/28/2023   Sepsis secondary to UTI (HCC) 09/27/2023   UTI (urinary tract infection) 09/27/2023   NSTEMI (non-ST elevated myocardial infarction) (HCC) 09/27/2023   Acute kidney injury superimposed on chronic kidney disease 09/27/2023   HTN (hypertension) 09/27/2023   Acute metabolic encephalopathy 09/27/2023   Hyponatremia 09/27/2023   Shortness of breath 02/17/2015   Smoker 02/17/2015   Hyperlipidemia 02/17/2015   Osteoarthritis of both hips 02/17/2015   Fecal incontinence due to anorectal disorder 04/13/2014   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 04/13/2014   ONSET DATE: 3  months approx  REFERRING DIAG: R26.89 (ICD-10-CM) - Balance disorder   THERAPY DIAG:  Abnormality of gait and mobility  Unsteadiness on feet  Difficulty in walking, not elsewhere classified  Muscle weakness (generalized)  Rationale for Evaluation and Treatment: Rehabilitation  SUBJECTIVE:  SUBJECTIVE STATEMENT:  Pt doing well since last session. No falls. Has been doing HEP regularly and has quesiton regarding getting TB into position easier.   Pt accompanied by: self  PERTINENT HISTORY: HTN, HLD   From eval: Pt reports with her balance it is hard to explain. Pt has a lot of stumbles when she is turning around and is not very sure footed. Pt reports this has been going on for several months. Pt has not had any falls in the last 6 months but several stumbles. Pt reports it started with a UTI and it got very bad during this. Since then there has been a noticeable difference in her balance, speech and her over all and has had some difficulty with word finding. Pt has been doing a lot less and feels she needs to do more things.  Patient also reports she has a fear of bending down to pick things up at times although she is able to do it in the clinic she reports the other day she was too afraid to bend down to pick something up she needed off the ground and had to ask for assistance.  Edition patient feels she is unable to go up and down a full flight of steps and has absolute certainty of falling with here in her opinion.  PAIN:  Are you having pain? No  PRECAUTIONS: Fall  RED FLAGS: None   WEIGHT BEARING RESTRICTIONS: No  FALLS: Has patient fallen in last 6 months? No and several stumbles   LIVING ENVIRONMENT: Lives with: lives with their family and and has a dog  Lives in:  House/apartment Stairs: Yes: External: 2 steps; can reach both Has following equipment at home: None  PLOF: Independent and Independent with basic ADLs  PATIENT GOALS: Improve her overall mobility and safety with balance   OBJECTIVE:  Note: Objective measures were completed at Evaluation unless otherwise noted.  DIAGNOSTIC FINDINGS: n/a  COGNITION: Overall cognitive status: pt reports some memory and word finding impairment since UTI   SENSATION: Not tested    LOWER EXTREMITY ROM:     WFL for tasks assessed   LOWER EXTREMITY MMT:    MMT Right Eval Left Eval  Hip flexion 4+ 4+  Hip extension    Hip abduction 4+ 4+  Hip adduction 4+ 4+  Hip internal rotation    Hip external rotation    Knee flexion 4+ 4+  Knee extension 4+ 4+  Ankle dorsiflexion 4+ 4+  Ankle plantarflexion    Ankle inversion    Ankle eversion    (Blank rows = not tested)   TRANSFERS: Sit to stand: Complete Independence  Assistive device utilized: None     Stand to sit: Complete Independence  Assistive device utilized: None     Chair to chair: Complete Independence  Assistive device utilized: None       RAMP:  Not tested  CURB:  Not tested  STAIRS: Findings: Level of Assistance: Modified independence, Stair Negotiation Technique: Alternating Pattern  with Bilateral Rails, Number of Stairs: 4, Height of Stairs: 6 in   , and Comments: Is afraid of doing a flight of steps, feels as though she would fall.  GAIT: Findings: Distance walked: 50 ft and Comments: NBOS, unsteady at times   FUNCTIONAL TESTS:   5 times sit to stand: 15.69 sec  10 meter walk test: 12.99 sec = .76 m/s : 265 ft, mild imbalance at times, no AD  Dynamic Gait Index: Test visit 2  PATIENT SURVEYS:  ABC scale: The Activities-Specific Balance Confidence (ABC) Scale 0% 10 20 30  40 50 60 70 80 90 100% No confidence<->completely confident  How confident are you that you will not lose your balance or become unsteady  when you . . .   Date tested 06/29/24  Walk around the house 100%  2. Walk up or down stairs 100%  3. Bend over and pick up a slipper from in front of a closet floor 80%  4. Reach for a small can off a shelf at eye level 100%  5. Stand on tip toes and reach for something above your head 90%  6. Stand on a chair and reach for something 0%  7. Sweep the floor 100%  8. Walk outside the house to a car parked in the driveway 100%  9. Get into or out of a car 100%  10. Walk across a parking lot to the mall 70%  11. Walk up or down a ramp 80%  12. Walk in a crowded mall where people rapidly walk past you 100%  13. Are bumped into by people as you walk through the mall 80%  14. Step onto or off of an escalator while you are holding onto the railing 100%  15. Step onto or off an escalator while holding onto parcels such that you cannot hold onto the railing 100%  16. Walk outside on icy sidewalks 0%  Total: #/16 81.25%                                                                                                                                 TREATMENT DATE: 11/04/24    TA- To improve functional movements patterns for everyday tasks    Sidestepping at support bar with RTB around ankles - instruction of safe donning and doffing without tying it in seated position. 2 x 6 laps at support bar  Alternating forward and retro step over hurdles 2 x 10 ea - no UE support, improved foot clearance   STS with weighted ball 4 KG x 10  NMR: To facilitate reeducation of movement, balance, posture, coordination, and/or proprioception/kinesthetic sense.   Stand on airex and forward and superior reach to play white baord game x 6 min ( broken up into 3 min rounds)   Airex step taps 2 x 10 ea - CGA   Retro and forward walking with cable resistance x 3 ea with 7.5# resistance - 1 significant LOB with post walking requiring mod A from PT for prevention of fall.   Unless otherwise stated, at least CGA was  provided and gait belt donned in order to ensure pt safety.   Pt required occasional rest breaks due fatigue, PT was attentive to when pt appeared to be tired or winded in order to prevent excessive fatigue. Pt instructed on pursed lip breathing to help with SOB.    PATIENT EDUCATION: Education details: POC Person educated:  Patient Education method: Explanation Education comprehension: verbalized understanding   HOME EXERCISE PROGRAM: Access Code: [email or web address] URL: [email or web address] Date: 10/07/2024 Prepared by: Lonni Gainer  Exercises - Sit to Stand with Hands on Knees  - 1 x daily - 7 x weekly - 2 sets - 5 reps - Standing March with Counter Support  - 1 x daily - 7 x weekly - 2 sets - 10 reps - Narrow Stance with Counter Support - Vertical Head Nods  - 1 x daily - 7 x weekly - 2 sets - 10 reps - Walking  - 1 x daily - 7 x weekly - 2 sets - 5-8 reps - 1-2 minutes hold - Side Stepping with Resistance at Ankles  - 1 x daily - 7 x weekly - 2 sets - 10 reps - Alternating Step Backward with Support  - 1 x daily - 7 x weekly - 2 sets - 10 reps   GOALS: Goals reviewed with patient? No  SHORT TERM GOALS: Target date: 07/27/2024  Patient will be independent in home exercise program to improve strength/mobility for better functional independence with ADLs. Baseline: HEP provided 9/16/202512/31: Patient completing regularly other than this past week  Goal status: met  LONG TERM GOALS: Target date: 11/17/2024   1.  Patient will complete five times sit to stand test in < 13 seconds indicating an increased LE strength and improved balance. Baseline: 15.69 sec 10/28:11.2 sec  Goal status: MET  2.  Patient will improve ABC scale score to 90%   to demonstrate statistically significant improvement in mobility and quality of life as it relates to their balance confidence.  Baseline: 81.25%; 12/2: 58% Goal status: ONGOING   3.  Patient will increase Berg Balance score by >  6 points to demonstrate decreased fall risk during functional activities. Baseline: 46, 10/28 47/56; 12/2: 55/56 Goal status: MET   4.   Patient will improve DGI score by 4 points or greater to reduce fall risk and demonstrate improved dynamic balance. Baseline: 07/06/24: 13/24, 08/17/24: 15/24; 12/2: 17/24 Goal status: MET  5.   Patient will increase 10 meter walk test to >1.71m/s as to improve gait speed for better community ambulation and to reduce fall risk. Baseline: .76 m/s, 10/28: .53m/s, 12/02: 1.04 m/s Goal status: MET  6.   Patient will increase 2 minute walk test distance to >400 ft for progression to community ambulator and improve gait ability Baseline: 265 ft, 12/2 405 ft Goal status: MET  7.  Patient will report 60% confidence in reaching overhead for improved functional balance and safety in retrieving items from cabinets.  Baseline: 10% confidence per ABC scale Goal status: INITIAL   8.  Patient will be able to walk backwards 38ft without posterior LOB to demonstrate decreased fall risk with dynamic gait activities. Baseline: To be assessed at next visit 12/31: completes with cues for form Goal status: IN PROGRESS   ASSESSMENT:  CLINICAL IMPRESSION:   The patient undertook the session as a traveler strengthening for the road ahead, showing steadier footing during hurdle stepping with improved foot clearance and determined effort in sit to stand practice. Upon the Airex, they faced the shifting ground to sharpen balance and awareness, yet during resisted walking a sudden sway brought them to the brink of a fall, and the therapist intervened with moderate assistance to prevent what might have been a grave misstep. Contact guard assistance and a gait belt were maintained throughout to ensure safety, with rest granted when fatigue crept  in and breath guided through pursed lip breathing. In all, the patient endured the work with resolve and continues forward on a path of  improving balance, endurance, and control. Pt will continue to benefit from skilled physical therapy intervention to address impairments, improve QOL, and attain therapy goals.    OBJECTIVE IMPAIRMENTS: Abnormal gait, decreased activity tolerance, decreased balance, decreased endurance, difficulty walking, and decreased strength.   ACTIVITY LIMITATIONS: standing, stairs, and locomotion level  PARTICIPATION LIMITATIONS: laundry, shopping, community activity, and yard work  PERSONAL FACTORS: Age and 1-2 comorbidities: HLD, HTN are also affecting patient's functional outcome.   REHAB POTENTIAL: Good  CLINICAL DECISION MAKING: Evolving/moderate complexity  EVALUATION COMPLEXITY: Moderate  PLAN:  PT FREQUENCY: 1-2x/week  PT DURATION: 4 weeks  PLANNED INTERVENTIONS: 97750- Physical Performance Testing, 97110-Therapeutic exercises, 97530- Therapeutic activity, 97112- Neuromuscular re-education, 97535- Self Care, [id number]- Manual therapy, [phone]- Gait training, Balance training, Stair training, DME instructions, and Moist heat  PLAN FOR NEXT SESSION:   *monitor HR during session* Dynamic gait including: Turning Horizontal head turns Change in gait speed Retro transition Balance w/ rotational component  Bwd gait Overhead reaching activities Update HEP   Note: Portions of this document were prepared using Dragon voice recognition software and although reviewed may contain unintentional dictation errors in syntax, grammar, or spelling.   Lonni Gainer PT, DPT    4:21 PM 11/04/24  "

## 2024-11-08 ENCOUNTER — Ambulatory Visit: Payer: Self-pay | Admitting: Physical Therapy

## 2024-11-09 ENCOUNTER — Ambulatory Visit

## 2024-11-11 ENCOUNTER — Ambulatory Visit: Admitting: Physical Therapy

## 2024-11-15 ENCOUNTER — Ambulatory Visit: Payer: Self-pay | Admitting: Physical Therapy

## 2024-11-15 ENCOUNTER — Ambulatory Visit: Payer: Self-pay | Admitting: Occupational Therapy

## 2024-11-18 ENCOUNTER — Ambulatory Visit: Admitting: Physical Therapy

## 2024-11-22 ENCOUNTER — Ambulatory Visit

## 2024-11-22 ENCOUNTER — Ambulatory Visit: Payer: Self-pay | Admitting: Physical Therapy

## 2024-11-25 ENCOUNTER — Ambulatory Visit: Admitting: Physical Therapy

## 2024-11-30 ENCOUNTER — Ambulatory Visit: Payer: Self-pay | Admitting: Physical Therapy

## 2024-12-02 ENCOUNTER — Ambulatory Visit: Admitting: Physical Therapy

## 2024-12-06 ENCOUNTER — Ambulatory Visit: Payer: Self-pay | Admitting: Physical Therapy

## 2024-12-09 ENCOUNTER — Ambulatory Visit: Admitting: Physical Therapy

## 2024-12-13 ENCOUNTER — Ambulatory Visit: Payer: Self-pay | Admitting: Physical Therapy

## 2024-12-22 ENCOUNTER — Ambulatory Visit: Payer: Self-pay | Admitting: Physical Therapy

## 2024-12-29 ENCOUNTER — Ambulatory Visit: Payer: Self-pay | Admitting: Physical Therapy

## 2024-12-29 ENCOUNTER — Ambulatory Visit: Admitting: Occupational Therapy

## 2025-01-04 ENCOUNTER — Ambulatory Visit: Admitting: Physical Therapy

## 2025-01-04 ENCOUNTER — Ambulatory Visit

## 2025-01-05 ENCOUNTER — Ambulatory Visit: Payer: Self-pay | Admitting: Physical Therapy

## 2025-01-12 ENCOUNTER — Ambulatory Visit: Payer: Self-pay | Admitting: Physical Therapy

## 2025-01-12 ENCOUNTER — Ambulatory Visit: Admitting: Occupational Therapy

## 2025-01-19 ENCOUNTER — Ambulatory Visit: Payer: Self-pay | Admitting: Physical Therapy

## 2025-01-19 ENCOUNTER — Ambulatory Visit: Admitting: Occupational Therapy

## 2025-01-26 ENCOUNTER — Ambulatory Visit: Payer: Self-pay | Admitting: Physical Therapy

## 2025-01-26 ENCOUNTER — Ambulatory Visit: Admitting: Occupational Therapy

## 2025-02-02 ENCOUNTER — Ambulatory Visit: Admitting: Occupational Therapy

## 2025-02-02 ENCOUNTER — Ambulatory Visit: Payer: Self-pay | Admitting: Physical Therapy

## 2025-02-09 ENCOUNTER — Ambulatory Visit

## 2025-02-09 ENCOUNTER — Ambulatory Visit: Payer: Self-pay | Admitting: Physical Therapy

## 2025-02-16 ENCOUNTER — Ambulatory Visit: Payer: Self-pay | Admitting: Physical Therapy

## 2025-02-16 ENCOUNTER — Ambulatory Visit

## 2025-02-23 ENCOUNTER — Ambulatory Visit

## 2025-02-23 ENCOUNTER — Ambulatory Visit: Payer: Self-pay | Admitting: Physical Therapy

## 2025-03-02 ENCOUNTER — Ambulatory Visit: Payer: Self-pay | Admitting: Physical Therapy

## 2025-03-02 ENCOUNTER — Ambulatory Visit
